# Patient Record
Sex: Male | Born: 1960 | Race: Black or African American | Hispanic: No | Smoking: Current every day smoker
Health system: Southern US, Community
[De-identification: ages and names within clinical notes are randomized; demographics above are authoritative.]

## PROBLEM LIST (undated history)

## (undated) DIAGNOSIS — F1010 Alcohol abuse, uncomplicated: Secondary | ICD-10-CM

## (undated) DIAGNOSIS — E119 Type 2 diabetes mellitus without complications: Secondary | ICD-10-CM

---

## 2005-11-21 ENCOUNTER — Emergency Department: Payer: Self-pay | Admitting: Emergency Medicine

## 2005-12-04 ENCOUNTER — Emergency Department: Payer: Self-pay | Admitting: Emergency Medicine

## 2014-04-16 ENCOUNTER — Emergency Department: Payer: Self-pay | Admitting: Emergency Medicine

## 2014-06-11 ENCOUNTER — Emergency Department
Admit: 2014-06-11 | Disposition: A | Discharge status: Home / Self Care | Payer: Self-pay | Admitting: Emergency Medicine

## 2014-06-11 LAB — DRUG SCREEN, URINE
Amphetamines, Ur Screen: NEGATIVE
Barbiturates, Ur Screen: NEGATIVE
Benzodiazepine, Ur Scrn: NEGATIVE
CANNABINOID 50 NG, UR ~~LOC~~: POSITIVE
Cocaine Metabolite,Ur ~~LOC~~: NEGATIVE
MDMA (ECSTASY) UR SCREEN: NEGATIVE
Methadone, Ur Screen: NEGATIVE
Opiate, Ur Screen: NEGATIVE
PHENCYCLIDINE (PCP) UR S: NEGATIVE
Tricyclic, Ur Screen: NEGATIVE

## 2014-06-11 LAB — COMPREHENSIVE METABOLIC PANEL
ALK PHOS: 64 U/L
Albumin: 4.1 g/dL
Anion Gap: 12 (ref 7–16)
BUN: 11 mg/dL
Bilirubin,Total: 0.4 mg/dL
CREATININE: 0.68 mg/dL
Calcium, Total: 8.5 mg/dL — ABNORMAL LOW
Chloride: 99 mmol/L — ABNORMAL LOW
Co2: 23 mmol/L
EGFR (African American): >60
EGFR (Non-African Amer.): >60
GLUCOSE: 152 mg/dL — AB
POTASSIUM: 3.8 mmol/L
SGOT(AST): 61 U/L — ABNORMAL HIGH
SGPT (ALT): 50 U/L
Sodium: 134 mmol/L — ABNORMAL LOW
TOTAL PROTEIN: 7.5 g/dL

## 2014-06-11 LAB — CBC
HCT: 37.8 % — ABNORMAL LOW (ref 40.0–52.0)
HGB: 12.9 g/dL — AB (ref 13.0–18.0)
MCH: 30.4 pg (ref 26.0–34.0)
MCHC: 34.1 g/dL (ref 32.0–36.0)
MCV: 89 fL (ref 80–100)
PLATELETS: 336 10*3/uL (ref 150–440)
RBC: 4.24 10*6/uL — AB (ref 4.40–5.90)
RDW: 12.8 % (ref 11.5–14.5)
WBC: 5.3 10*3/uL (ref 3.8–10.6)

## 2014-06-11 LAB — URINALYSIS, COMPLETE
BILIRUBIN, UR: NEGATIVE
Blood: NEGATIVE
GLUCOSE, UR: NEGATIVE mg/dL (ref 0–75)
LEUKOCYTE ESTERASE: NEGATIVE
Nitrite: NEGATIVE
Ph: 5 (ref 4.5–8.0)
Protein: NEGATIVE
RBC, UR: NONE SEEN /HPF (ref 0–5)
SQUAMOUS EPITHELIAL: NONE SEEN
Specific Gravity: 1.023 (ref 1.003–1.030)

## 2014-06-11 LAB — ETHANOL: ETHANOL LVL: 42 mg/dL

## 2014-06-11 LAB — ACETAMINOPHEN LEVEL: Acetaminophen: <10 ug/mL

## 2014-06-11 LAB — SALICYLATE LEVEL: Salicylates, Serum: <4 mg/dL

## 2016-07-31 ENCOUNTER — Emergency Department
Admission: EM | Admit: 2016-07-31 | Discharge: 2016-07-31 | Disposition: A | Discharge status: Home / Self Care | Payer: Self-pay | Attending: Emergency Medicine | Admitting: Emergency Medicine

## 2016-07-31 ENCOUNTER — Emergency Department: Payer: Self-pay

## 2016-07-31 ENCOUNTER — Encounter: Payer: Self-pay | Admitting: Emergency Medicine

## 2016-07-31 DIAGNOSIS — R0981 Nasal congestion: Secondary | ICD-10-CM | POA: Insufficient documentation

## 2016-07-31 DIAGNOSIS — J40 Bronchitis, not specified as acute or chronic: Secondary | ICD-10-CM | POA: Insufficient documentation

## 2016-07-31 DIAGNOSIS — F17200 Nicotine dependence, unspecified, uncomplicated: Secondary | ICD-10-CM | POA: Insufficient documentation

## 2016-07-31 DIAGNOSIS — R05 Cough: Secondary | ICD-10-CM | POA: Insufficient documentation

## 2016-07-31 MED ORDER — IPRATROPIUM-ALBUTEROL 0.5-2.5 (3) MG/3ML IN SOLN
3.0000 mL | Freq: Once | RESPIRATORY_TRACT | Status: AC
  Administered 2016-07-31: 3 mL via RESPIRATORY_TRACT
  Filled 2016-07-31: qty 3

## 2016-07-31 MED ORDER — PREDNISONE 10 MG PO TABS: ORAL_TABLET | ORAL | 0 refills | Status: DC

## 2016-07-31 MED ORDER — AZITHROMYCIN 250 MG PO TABS: ORAL_TABLET | ORAL | 0 refills | Status: DC

## 2016-07-31 NOTE — ED Triage Notes (Signed)
Pt with cough and congestion for over a week.

## 2016-07-31 NOTE — ED Notes (Signed)
See triage note states he has had chest congestion and prod cough for about 6 days  States he may had some fever but not sure of what his fever was..Richard Estes

## 2016-07-31 NOTE — ED Provider Notes (Signed)
[facility] Emergency Department Provider Note  ____________________________________________  Time seen: Approximately 6:01 PM  I have reviewed the triage vital signs and the nursing notes.   HISTORY  Chief Complaint Sore Throat; Nasal Congestion; and Cough    HPI Richard Estes is a 56 y.o. male that presents to emergency department with 7 days of productive cough with clear sputum. He is eating and drinking normally. Patient has taken Mucinex for symptoms. He smokes one pack of cigarettes per day. No history of asthma or allergies. He denies fever, headache, nasal congestion, sore throat, shortness of breath, chest pain, nausea, vomiting, abdominal pain.   History reviewed. No pertinent past medical history.  There are no active problems to display for this patient.   History reviewed. No pertinent surgical history.  Prior to Admission medications   Medication Sig Start Date End Date Taking? Authorizing Provider  azithromycin (ZITHROMAX Z-PAK) 250 MG tablet Take 2 tablets (500 mg) on  Day 1,  followed by 1 tablet (250 mg) once daily on Days 2 through 5. 07/31/16   Enid DerryWagner, Raylin Diguglielmo, PA-C  predniSONE (DELTASONE) 10 MG tablet Take 6 tablets on day 1, take 5 tablets on day 2, take 4 tablets on day 3, take 3 tablets on day 4, take 2 tablets on day 5, take 1 tablet on day 6 07/31/16   Enid DerryWagner, Llewellyn Schoenberger, PA-C    Allergies Patient has no known allergies.  No family history on file.  Social History Social History  Substance Use Topics  . Smoking status: Current Every Day Smoker  . Smokeless tobacco: Never Used  . Alcohol use Yes     Review of Systems  Constitutional: No fever/chills Eyes: No visual changes. No discharge. ENT: Negative for congestion and rhinorrhea. Cardiovascular: No chest pain. Respiratory: Positive for cough. No SOB. Gastrointestinal: No abdominal pain.  No nausea, no vomiting.  No diarrhea.  No constipation. Musculoskeletal:  Negative for musculoskeletal pain. Skin: Negative for rash, abrasions, lacerations, ecchymosis. Neurological: Negative for headaches.   ____________________________________________   PHYSICAL EXAM:  VITAL SIGNS: ED Triage Vitals [07/31/16 1644]  Enc Vitals Group     BP (!) 126/93     Pulse Rate (!) 106     Resp 20     Temp 98.9 F (37.2 C)     Temp Source Oral     SpO2 97 %     Weight 155 lb (70.3 kg)     Height      Head Circumference      Peak Flow      Pain Score      Pain Loc      Pain Edu?      Excl. in GC?      Constitutional: Alert and oriented. Well appearing and in no acute distress. Eyes: Conjunctivae are normal. PERRL. EOMI. No discharge. Head: Atraumatic. ENT: No frontal and maxillary sinus tenderness.      Ears: Tympanic membranes pearly gray with good landmarks. No discharge.      Nose: No congestion/rhinnorhea.      Mouth/Throat: Mucous membranes are moist. Oropharynx non-erythematous. Tonsils not enlarged. No exudates. Uvula midline. Neck: No stridor.   Hematological/Lymphatic/Immunilogical: No cervical lymphadenopathy. Cardiovascular: Normal rate, regular rhythm.  Good peripheral circulation. Respiratory: Normal respiratory effort without tachypnea or retractions. Lungs CTAB. Good air entry to the bases with no decreased or absent breath sounds. Gastrointestinal: Bowel sounds 4 quadrants. Soft and nontender to palpation. No guarding or rigidity. No palpable masses. No distention.  Musculoskeletal: Full range of motion to all extremities. No gross deformities appreciated. Neurologic:  Normal speech and language. No gross focal neurologic deficits are appreciated.  Skin:  Skin is warm, dry and intact. No rash noted.   ____________________________________________   LABS (all labs ordered are listed, but only abnormal results are displayed)  Labs Reviewed - No data to  display ____________________________________________  EKG   ____________________________________________  RADIOLOGY Lexine Baton, personally viewed and evaluated these images (plain radiographs) as part of my medical decision making, as well as reviewing the written report by the radiologist.  Dg Chest 2 View  Result Date: 07/31/2016 CLINICAL DATA:  Chest congestion and productive cough for the past 6 days. Possible fever during that time. EXAM: CHEST  2 VIEW COMPARISON:  11/21/2005. FINDINGS: Normal sized heart. Clear lungs. Mild central peribronchial thickening. Unremarkable bones. IMPRESSION: Mild bronchitic changes. Electronically Signed   By: Beckie Salts M.D.   On: 07/31/2016 17:41    ____________________________________________    PROCEDURES  Procedure(s) performed:    Procedures    Medications  ipratropium-albuterol (DUONEB) 0.5-2.5 (3) MG/3ML nebulizer solution 3 mL (3 mLs Nebulization Given 07/31/16 1811)     ____________________________________________   INITIAL IMPRESSION / ASSESSMENT AND PLAN / ED COURSE  Pertinent labs & imaging results that were available during my care of the patient were reviewed by me and considered in my medical decision making (see chart for details).  Review of the [place] CSRS was performed in accordance of the [facility] prior to dispensing any controlled drugs.   Patient's diagnosis is consistent with bronchitis. Vital signs and exam are reassuring. No acute cardiopulmonary processes on x-ray. He was given DuoNeb. Patient appears well and is staying well hydrated. Patient feels comfortable going home. Patient will be discharged home with prescriptions for azithromycin and prednisone. Patient is to follow up with PCP as needed or otherwise directed. Patient is given ED precautions to return to the ED for any worsening or new symptoms.     ____________________________________________  FINAL CLINICAL IMPRESSION(S) / ED  DIAGNOSES  Final diagnoses:  Bronchitis      NEW MEDICATIONS STARTED DURING THIS VISIT:  Discharge Medication List as of 07/31/2016  6:13 PM    START taking these medications   Details  azithromycin (ZITHROMAX Z-PAK) 250 MG tablet Take 2 tablets (500 mg) on  Day 1,  followed by 1 tablet (250 mg) once daily on Days 2 through 5., Print    predniSONE (DELTASONE) 10 MG tablet Take 6 tablets on day 1, take 5 tablets on day 2, take 4 tablets on day 3, take 3 tablets on day 4, take 2 tablets on day 5, take 1 tablet on day 6, Print            This chart was dictated using voice recognition software/Dragon. Despite best efforts to proofread, errors can occur which can change the meaning. Any change was purely unintentional.    Enid Derry, PA-C 07/31/16 1849    Loleta Rose, MD 07/31/16 [phone]

## 2016-08-08 ENCOUNTER — Emergency Department
Admission: EM | Admit: 2016-08-08 | Discharge: 2016-08-11 | Disposition: A | Discharge status: Home / Self Care | Payer: Self-pay | Attending: Emergency Medicine | Admitting: Emergency Medicine

## 2016-08-08 ENCOUNTER — Encounter: Payer: Self-pay | Admitting: *Deleted

## 2016-08-08 DIAGNOSIS — F1910 Other psychoactive substance abuse, uncomplicated: Secondary | ICD-10-CM

## 2016-08-08 DIAGNOSIS — F1410 Cocaine abuse, uncomplicated: Secondary | ICD-10-CM | POA: Insufficient documentation

## 2016-08-08 DIAGNOSIS — F1210 Cannabis abuse, uncomplicated: Secondary | ICD-10-CM | POA: Insufficient documentation

## 2016-08-08 DIAGNOSIS — F17200 Nicotine dependence, unspecified, uncomplicated: Secondary | ICD-10-CM | POA: Insufficient documentation

## 2016-08-08 DIAGNOSIS — F10230 Alcohol dependence with withdrawal, uncomplicated: Secondary | ICD-10-CM

## 2016-08-08 LAB — COMPREHENSIVE METABOLIC PANEL
ALT: 61 U/L (ref 17–63)
AST: 56 U/L — AB (ref 15–41)
Albumin: 3.8 g/dL (ref 3.5–5.0)
Alkaline Phosphatase: 46 U/L (ref 38–126)
Anion gap: 9 (ref 5–15)
BUN: 16 mg/dL (ref 6–20)
CO2: 27 mmol/L (ref 22–32)
Calcium: 9.2 mg/dL (ref 8.9–10.3)
Chloride: 98 mmol/L — ABNORMAL LOW (ref 101–111)
Creatinine, Ser: 0.92 mg/dL (ref 0.61–1.24)
GFR calc Af Amer: >60 mL/min (ref >60)
Glucose, Bld: 123 mg/dL — ABNORMAL HIGH (ref 65–99)
POTASSIUM: 3.9 mmol/L (ref 3.5–5.1)
Sodium: 134 mmol/L — ABNORMAL LOW (ref 135–145)
TOTAL PROTEIN: 7.5 g/dL (ref 6.5–8.1)
Total Bilirubin: 0.7 mg/dL (ref 0.3–1.2)

## 2016-08-08 LAB — CBC
HCT: 36.7 % — ABNORMAL LOW (ref 40.0–52.0)
Hemoglobin: 12.8 g/dL — ABNORMAL LOW (ref 13.0–18.0)
MCH: 32.1 pg (ref 26.0–34.0)
MCHC: 35 g/dL (ref 32.0–36.0)
MCV: 91.8 fL (ref 80.0–100.0)
Platelets: 293 10*3/uL (ref 150–440)
RBC: 3.99 MIL/uL — ABNORMAL LOW (ref 4.40–5.90)
RDW: 12.9 % (ref 11.5–14.5)
WBC: 7 10*3/uL (ref 3.8–10.6)

## 2016-08-08 LAB — ETHANOL: Alcohol, Ethyl (B): 5 mg/dL — ABNORMAL HIGH (ref <5)

## 2016-08-08 NOTE — ED Notes (Signed)
Pt. States he wants to detox from alcohol, cocaine and marijuana.  Pt. States he had been clean for 8 months.   Pt. States he relapsed last week.

## 2016-08-08 NOTE — ED Triage Notes (Signed)
Pt reports loosing job and place to live, pt states he has thought about suicide with no plan in the past, pt denies HI/SI, pt reports feeling depressed, pt also wants help to detox from ETOH, cocaine and marijuana, pt last used all three this morning

## 2016-08-08 NOTE — ED Notes (Signed)
Pt. States the last use of ETOH and cocaine was last night.

## 2016-08-09 LAB — URINE DRUG SCREEN, QUALITATIVE (ARMC ONLY)
Amphetamines, Ur Screen: NOT DETECTED
Barbiturates, Ur Screen: NOT DETECTED
Benzodiazepine, Ur Scrn: NOT DETECTED
CANNABINOID 50 NG, UR ~~LOC~~: POSITIVE — AB
COCAINE METABOLITE, UR ~~LOC~~: POSITIVE — AB
MDMA (Ecstasy)Ur Screen: NOT DETECTED
METHADONE SCREEN, URINE: NOT DETECTED
OPIATE, UR SCREEN: NOT DETECTED
Phencyclidine (PCP) Ur S: NOT DETECTED
Tricyclic, Ur Screen: NOT DETECTED

## 2016-08-09 MED ORDER — FOLIC ACID 1 MG PO TABS
1.0000 mg | ORAL_TABLET | Freq: Every day | ORAL | Status: DC
  Administered 2016-08-09 – 2016-08-10 (×2): 1 mg via ORAL
  Filled 2016-08-09 (×2): qty 1

## 2016-08-09 MED ORDER — CHLORDIAZEPOXIDE HCL 25 MG PO CAPS
25.0000 mg | ORAL_CAPSULE | Freq: Three times a day (TID) | ORAL | Status: DC | PRN
  Administered 2016-08-10 (×2): 25 mg via ORAL
  Filled 2016-08-09 (×2): qty 1

## 2016-08-09 MED ORDER — ACETAMINOPHEN 325 MG PO TABS
650.0000 mg | ORAL_TABLET | Freq: Four times a day (QID) | ORAL | Status: DC | PRN
  Administered 2016-08-09: 650 mg via ORAL
  Filled 2016-08-09: qty 2

## 2016-08-09 MED ORDER — VITAMIN B-1 100 MG PO TABS
100.0000 mg | ORAL_TABLET | Freq: Every day | ORAL | Status: DC
  Administered 2016-08-09 – 2016-08-10 (×2): 100 mg via ORAL
  Filled 2016-08-09 (×2): qty 1

## 2016-08-09 MED ORDER — LORAZEPAM 2 MG PO TABS
0.0000 mg | ORAL_TABLET | Freq: Four times a day (QID) | ORAL | Status: AC
  Administered 2016-08-09: 2 mg via ORAL
  Filled 2016-08-09: qty 1

## 2016-08-09 MED ORDER — FOLIC ACID 1 MG PO TABS
1.0000 mg | ORAL_TABLET | Freq: Once | ORAL | Status: AC
  Administered 2016-08-09: 1 mg via ORAL
  Filled 2016-08-09: qty 1

## 2016-08-09 MED ORDER — LORAZEPAM 2 MG/ML IJ SOLN: 0.0000 mg | Freq: Four times a day (QID) | INTRAMUSCULAR | Status: DC

## 2016-08-09 NOTE — Progress Notes (Signed)
Pt presents with blunted affect and irritable mood on initial contact. A & O X4. Denies SI, HI, AVH and pain at this time. Verbalized being depressed related to current situation "I just don't have nowhere to go, I don't have a job and that's making me feel down".  Pt compliant with medications when offered. Cooperative with care / assessments and unit routines. Medications administered as per MD's orders. Emotional support and encouragement provided to pt. Q 15 minutes safety checks maintained without self harm gestures or outburst to note thus far. POC continues for safety and mood stability.

## 2016-08-09 NOTE — BH Assessment (Addendum)
Tele Assessment Note   Richard Estes is an 56 y.o.separated male, voluntarily brought in by [employer] after going to the police station and asking for help in the evening hours of 08/08/2016. Patient reported use of alcohol, cannabis, cocaine, and cigarettes prior to going to the police station.  Patient stated having a history of blackouts, "often," when using various substances.  Patient reported loss of his home and job during the previous week due to his inability to function after the use of preferred substances. Patient reported experiencing suicidal ideations, however not having a plan or intentions for other self-harm.  Patient stated, "I just can't keep living like this."  Patient reported having a decrease in sleep patterns and was only able to obtain 4 hours of sleep on a daily basis.  Patient reported continual experiences with depressive symptoms, such as despondency, fatigue, isolation, tearfulness, guilt, feelings of worthlessness, irritability and loss of interest in usual pleasures.  Patient denies HI/AVH or access to weapons.  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  Patient stated that he had a experienced a loss of 15 lbs during the previous year.  Patient reported being separated from his spouse.  Patient reported receiving inpatient treatment, for substance abuse, in 2014/2015, however not finish his treatment and leaving the facility early.   Patient is currently receiving no services from providers.  Patient identified support from his mother, however reported not wanting to bother her due to her being 56 years old.  Patient identified protective, such as his fear of death, that caused him not to attempt suicide.  Patient stated, "I'm too scared to do anything."  During assessment, Patient was cooperative, logical/coherent, and alert.  Patient was oriented to the time, place, location, and situation.  Patient's eye contact was  fair and he exhibited freedom of movement.  Patient made statements, such as "I ain't had anybody to talk to."  Patient stated that he would like to receive services from outpatient or inpatient services.  Patient stated that he would be accept of recommendations of treatment from providers.     Diagnosis: Major depressive disorder, recurrent, moderate Alcohol Use Disorder, Stimulant Use Disorder, Cannabis Use Disorder, Tobacco Use Disorder  Past Medical History: History reviewed. No pertinent past medical history.  History reviewed. No pertinent surgical history.  Family History: No family history on file.  Social History:  reports that he has been smoking.  He has never used smokeless tobacco. He reports that he drinks alcohol. He reports that he does not use drugs.  Additional Social History:  Alcohol / Drug Use Pain Medications: Patient denies Prescriptions: Patient denies Over the Counter: Patient denies History of alcohol / drug use?: Yes Longest period of sobriety (when/how long): Patient reports ongoing use of substances since the age of 40 or 32. Negative Consequences of Use: Legal, Financial, Personal relationships, Work / School Substance #1 Name of Substance 1: Alcohol 1 - Age of First Use: 15 or 16 1 - Amount (size/oz): Unknown 1 - Frequency: Daily 1 - Duration: Ongoing 1 - Last Use / Amount: 08/08/2016 Substance #2 Name of Substance 2: Cannabis 2 - Age of First Use: 15 or 16 2 - Amount (size/oz): Unknown 2 - Frequency: Daily 2 - Duration: Ongoing 2 - Last Use / Amount: 08/08/2016 Substance #3 Name of Substance 3: Cocaine 3 - Age of First Use: 24 3 - Amount (size/oz): Unknown 3 - Frequency: 2x Monthly  3 - Duration: Ongoing 3 - Last Use / Amount: 08/08/2016  CIWA: CIWA-Ar BP: (!) 131/108 Pulse Rate: 99 COWS:    PATIENT STRENGTHS: (choose at least two) Ability for insight Average or above average intelligence Capable of independent  living Communication skills Motivation for treatment/growth Work skills  Allergies: No Known Allergies  Home Medications:  (Not in a hospital admission)  OB/GYN Status:  No LMP for male patient.  General Assessment Data [facility] Assessment: In system Is this a Tele or Face-to-Face Assessment?: Tele Assessment Is this an Initial Assessment or a Re-assessment for this encounter?: Initial Assessment Marital status: Separated Maiden name: N/A Is patient pregnant?: No Pregnancy Status: No Living Arrangements: Other (Comment) (Pt. reported being homeless) Can pt return to current living arrangement?: Yes Admission Status: Voluntary Is patient capable of signing voluntary admission?: Yes Referral Source: Self/Family/Friend Insurance type: None  Medical Screening Exam [facility]-in ONLY) Medical Exam completed: Yes Reason for MSE not completed: Other: (N/A)  Crisis Care Plan Living Arrangements: Other (Comment) (Pt. reported being homeless) Legal Guardian: Other: (Self) Name of Psychiatrist: None Name of Therapist: None  Education Status Is patient currently in school?: No Current Grade: N/A Highest grade of school patient has completed: 12th Name of school: N/A Contact person: N/A  Risk to self with the past 6 months Suicidal Ideation: Yes-Currently Present Has patient been a risk to self within the past 6 months prior to admission? : Yes Suicidal Intent: Yes-Currently Present Has patient had any suicidal intent within the past 6 months prior to admission? : Yes Is patient at risk for suicide?: Yes Suicidal Plan?: No-Not Currently/Within Last 6 Months (Pt. denies) Has patient had any suicidal plan within the past 6 months prior to admission? : No Access to Means: No (Pt. denies) What has been your use of drugs/alcohol within the last 12 months?: Alcohol, Cannabis, Cocaine, and Cigarettes Previous Attempts/Gestures: No (Pt. denies) How many  times?: 0 Other Self Harm Risks: 0 (Pt. denies) Triggers for Past Attempts: None known Intentional Self Injurious Behavior: None Family Suicide History: No Recent stressful life event(s): Loss (Comment), Job Loss, Financial Problems, Other (Comment) (Loss of home relating to job loss and SA) Persecutory voices/beliefs?: No Depression: Yes Depression Symptoms: Tearfulness, Fatigue, Guilt, Loss of interest in usual pleasures, Feeling worthless/self pity, Feeling angry/irritable, Isolating, Despondent Substance abuse history and/or treatment for substance abuse?: Yes Suicide prevention information given to non-admitted patients: Not applicable  Risk to Others within the past 6 months Homicidal Ideation: No (Pt. denies) Does patient have any lifetime risk of violence toward others beyond the six months prior to admission? : No Thoughts of Harm to Others: No Current Homicidal Intent: No Current Homicidal Plan: No Access to Homicidal Means: No (Pt. denies) Identified Victim: None History of harm to others?: No (Pt. denies) Assessment of Violence: On admission Violent Behavior Description: None Does patient have access to weapons?: No (Pt. denies) Criminal Charges Pending?: No Does patient have a court date: No Is patient on probation?: No  Psychosis Hallucinations: None noted Delusions: None noted  Mental Status Report Appearance/Hygiene: In scrubs Eye Contact: Fair Motor Activity: Freedom of movement, Unremarkable Speech: Logical/coherent, Soft Level of Consciousness: Alert Mood: Depressed, Despair, Sad, Helpless Affect: Sad, Appropriate to circumstance, Depressed Anxiety Level: Minimal Thought Processes: Coherent, Relevant, Circumstantial Judgement: Unimpaired Orientation: Person, Place, Time, Situation Obsessive Compulsive Thoughts/Behaviors: None  Cognitive Functioning Concentration: Fair Memory: Recent Intact, Remote Intact IQ: Average Insight: Fair Impulse Control:  Poor  Appetite: Poor Weight Loss: 15 (Pt. reports loss during the previous year) Weight Gain: 0 Sleep: Decreased Total Hours of Sleep: 4 Vegetative Symptoms: None  ADLScreening [facility] Services) Patient's cognitive ability adequate to safely complete daily activities?: Yes Patient able to express need for assistance with ADLs?: Yes Independently performs ADLs?: Yes (appropriate for developmental age)  Prior Inpatient Therapy Prior Inpatient Therapy: Yes Prior Therapy Dates: Unknown Prior Therapy Facilty/Provider(s): Substance Abuse Inpatient in [place], [place] Reason for Treatment: Substance Abuse (Pt. reported leaving program early.)  Prior Outpatient Therapy Prior Outpatient Therapy: No Prior Therapy Dates: N/A Prior Therapy Facilty/Provider(s): None Reason for Treatment: N/A Does patient have an ACCT team?: No Does patient have Intensive In-House Services?  : No Does patient have [facility] services? : No Does patient have P4CC services?: No  ADL Screening (condition at time of admission) Patient's cognitive ability adequate to safely complete daily activities?: Yes Is the patient deaf or have difficulty hearing?: No Does the patient have difficulty seeing, even when wearing glasses/contacts?: No Does the patient have difficulty concentrating, remembering, or making decisions?: No Patient able to express need for assistance with ADLs?: Yes Does the patient have difficulty dressing or bathing?: No Independently performs ADLs?: Yes (appropriate for developmental age) Does the patient have difficulty walking or climbing stairs?: No Weakness of Legs: None Weakness of Arms/Hands: None  Home Assistive Devices/Equipment Home Assistive Devices/Equipment: None    Abuse/Neglect Assessment (Assessment to be complete while patient is alone) Physical Abuse: Denies Verbal Abuse: Denies Sexual Abuse: Denies Exploitation of patient/patient's resources: Denies Self-Neglect:  Denies     Merchant navy officer (For Healthcare) Does Patient Have a Medical Advance Directive?: No Would patient like information on creating a medical advance directive?: No - Patient declined    Additional Information 1:1 In Past 12 Months?: No CIRT Risk: No Elopement Risk: No Does patient have medical clearance?: Yes     Disposition:  Disposition Initial Assessment Completed for this Encounter: Yes Disposition of Patient: Other dispositions (Refer to [facility] for disposition) Other disposition(s): Other (Comment) (Refer to [facility] for recommendations )  Talbert Nan 08/09/2016 1:08 AM

## 2016-08-09 NOTE — ED Provider Notes (Signed)
[facility] Emergency Department Provider Note   ____________________________________________   First MD Initiated Contact with Patient 08/09/16 2351     (approximate)  I have reviewed the triage vital signs and the nursing notes.   HISTORY  Chief Complaint Mental Health Problem    HPI Richard Estes is a 56 y.o. male who comes into the hospital today as he is here for detox. He reports that he is detoxing from alcohol, cocaine and marijuana. The patient states that he last used there is a night and Friday morning. He just started back using crack cocaine last week. He reports he hadn't used for over 8 months. He reports that he drinks as much as he can every day until his money runs out. He reports that he is in detox before but it was about years ago. The patient reports that he did have some feelings of suicidal ideation earlier today but he denies having any of those feelings now. The patient denies any hallucinations. He is here for evaluation and for help.   History reviewed. No pertinent past medical history.  There are no active problems to display for this patient.   History reviewed. No pertinent surgical history.  Prior to Admission medications   Medication Sig Start Date End Date Taking? Authorizing Provider  azithromycin (ZITHROMAX Z-PAK) 250 MG tablet Take 2 tablets (500 mg) on  Day 1,  followed by 1 tablet (250 mg) once daily on Days 2 through 5. Patient not taking: Reported on 08/09/2016 07/31/16   Enid DerryWagner, Ashley, PA-C  predniSONE (DELTASONE) 10 MG tablet Take 6 tablets on day 1, take 5 tablets on day 2, take 4 tablets on day 3, take 3 tablets on day 4, take 2 tablets on day 5, take 1 tablet on day 6 Patient not taking: Reported on 08/09/2016 07/31/16   Enid DerryWagner, Ashley, PA-C    Allergies Patient has no known allergies.  No family history on file.  Social History Social History  Substance Use Topics  . Smoking status: Current Every  Day Smoker  . Smokeless tobacco: Never Used  . Alcohol use Yes    Review of Systems  Constitutional: No fever/chills Eyes: No visual changes. ENT: No sore throat. Cardiovascular: Denies chest pain. Respiratory: Denies shortness of breath. Gastrointestinal: No abdominal pain.  No nausea, no vomiting.  No diarrhea.  No constipation. Genitourinary: Negative for dysuria. Musculoskeletal: Negative for back pain. Skin: Negative for rash. Neurological: Negative for headaches, focal weakness or numbness.   ____________________________________________   PHYSICAL EXAM:  VITAL SIGNS: ED Triage Vitals [08/08/16 2311]  Enc Vitals Group     BP (!) 131/108     Pulse Rate 99     Resp 20     Temp 98.6 F (37 C)     Temp Source Oral     SpO2 98 %     Weight 145 lb (65.8 kg)     Height 5\' 9"  (1.753 m)     Head Circumference      Peak Flow      Pain Score      Pain Loc      Pain Edu?      Excl. in GC?     Constitutional: Alert and oriented. Well appearing and in no acute distress. Eyes: Conjunctivae are normal. PERRL. EOMI. Head: Atraumatic. Nose: No congestion/rhinnorhea. Mouth/Throat: Mucous membranes are moist.  Oropharynx non-erythematous. Cardiovascular: Normal rate, regular rhythm. Grossly normal heart sounds.  Good peripheral circulation. Respiratory: Normal  respiratory effort.  No retractions. Lungs CTAB. Gastrointestinal: Soft and nontender. No distention. Positive bowel sounds Musculoskeletal: No lower extremity tenderness nor edema.   Neurologic:  Normal speech and language.  Skin:  Skin is warm, dry and intact.  Psychiatric: Mood and affect are normal.   ____________________________________________   LABS (all labs ordered are listed, but only abnormal results are displayed)  Labs Reviewed  COMPREHENSIVE METABOLIC PANEL - Abnormal; Notable for the following:       Result Value   Sodium 134 (*)    Chloride 98 (*)    Glucose, Bld 123 (*)    AST 56 (*)    All  other components within normal limits  ETHANOL - Abnormal; Notable for the following:    Alcohol, Ethyl (B) 5 (*)    All other components within normal limits  CBC - Abnormal; Notable for the following:    RBC 3.99 (*)    Hemoglobin 12.8 (*)    HCT 36.7 (*)    All other components within normal limits  URINE DRUG SCREEN, QUALITATIVE ([facility] ONLY) - Abnormal; Notable for the following:    Cocaine Metabolite,Ur [place] POSITIVE (*)    Cannabinoid 50 Ng, Ur [place] POSITIVE (*)    All other components within normal limits   ____________________________________________  EKG  none ____________________________________________  RADIOLOGY  No results found.  ____________________________________________   PROCEDURES  Procedure(s) performed: None  Procedures  Critical Care performed: No  ____________________________________________   INITIAL IMPRESSION / ASSESSMENT AND PLAN / ED COURSE  Pertinent labs & imaging results that were available during my care of the patient were reviewed by me and considered in my medical decision making (see chart for details).  This is a 56 year old male who comes into the hospital today requesting detox. The patient did have some passive suicidal ideation. I will have the patient evaluated by TTS. I will give the patient some folate and thiamine. He'll be reassessed.      ____________________________________________   FINAL CLINICAL IMPRESSION(S) / ED DIAGNOSES  Final diagnoses:  Substance abuse      NEW MEDICATIONS STARTED DURING THIS VISIT:  New Prescriptions   No medications on file     Note:  This document was prepared using Dragon voice recognition software and may include unintentional dictation errors.    Rebecka Apley, MD 08/09/16 [phone]

## 2016-08-09 NOTE — ED Notes (Signed)

## 2016-08-09 NOTE — BHH Counselor (Signed)
Writer spoke with Matt, RN and confirmed no orders for tele-psych from SOC completed at the time.  Writer and RN discussed future correspondence if orders/tele-psych completed.     Kijana Estock, LPC-A & LCAS-A Therapeutic Triage Specialist [phone] 

## 2016-08-09 NOTE — ED Provider Notes (Addendum)
-----------------------------------------  4:47 PM on 08/09/2016 -----------------------------------------  Psychiatry consult note reviewed, recommends inpatient substance abuse treatment, CIWA protocol, Librium 3 times a day, folate and thiamine. I met with the patient face-to-face, or he is calm and cooperative with normal vital signs after receiving Ativan this morning for a CIWA score of 11. I discussed with psychiatry recommendations with him and he is agreeable for inpatient residential treatment. Will follow-up with [facility] for placement.    Carrie Mew, MD 08/09/16 1650   ----------------------------------------- 6:15 PM on 08/09/2016 -----------------------------------------  Discussed with [facility]. We don't have any inpatient detox here at [facility] that can accommodate this patient. Because his withdrawal is mild and uncomplicated and not associated with any psychiatric disorder such as suicidality or hallucinations, I'm told he does not meet criteria for [place]'s inpatient detox beds. As a result, I'm unable to arrange the inpatient detox that was recommended by the psychiatry consultant. The patient is given information for residential treatment services and [facility] which he can contact. He does report that he is homeless and would not be or to get on the bus until tomorrow morning. Because of this, we'll plan to discharge the morning so that we can give Librium in the meantime since otherwise if you're discharged now he would sit in the waiting room on medicated waiting until the bus comes tomorrow. He is medically and psychiatrically stable. Vital signs are normal.   Carrie Mew, MD 08/09/16 [phone]

## 2016-08-09 NOTE — ED Notes (Signed)
Patient is voluntary and is pending placement. 

## 2016-08-09 NOTE — ED Notes (Signed)
Report was received from Lenna GilfordMathew M., RN; Pt. Verbalizes  complaints of having Depression; distress of losses of job and home; verbalizes having S.I.; with no plan; H/O substance abuse of etoh; cocaine; and cannibus; denies having Hi. Continue to monitor with 15 min. Monitoring.

## 2016-08-09 NOTE — ED Notes (Signed)
rm 23 

## 2016-08-10 NOTE — ED Notes (Signed)
Pt continues to be polite, pleasant and cooperative. Pt given 2 doses of librium for CIWA scores of 6 and 8 respectively.  Pt only ate 50% of his lunch due to nausea.  Pt aware of transport to [facility] tomorrow morning. Maintained on 15 minute checks and observation by security camera for safety.

## 2016-08-10 NOTE — ED Notes (Signed)

## 2016-08-10 NOTE — ED Notes (Signed)
Report was received from Amy B., RN; Pt. Verbalizes no complaints or distress; denies S.I./Hi. Continue to monitor with 15 min. Monitoring. 

## 2016-08-10 NOTE — ED Notes (Signed)
Pt pleasant, cooperative. Compliant with medications. Denies SI/HI and AVH. No needs or concerns at this time. Maintained on 15 minute checks and observation by security camera for safety.

## 2016-08-10 NOTE — ED Notes (Signed)
Breakfast tray placed in patient's room.  

## 2016-08-10 NOTE — BH Assessment (Addendum)
Referral to [facility] was submitted... Acceptance pending.Richard Estes.  [facility] at [facility] has accepted patient, however they cannot admit him until tomorrow 08/11/16 AM... [facility] will have someone pick him up by 10AM on 08/11/16; [facility]

## 2016-08-10 NOTE — ED Notes (Signed)
Pt received lunch tray 

## 2016-08-10 NOTE — ED Provider Notes (Signed)
-----------------------------------------   7:14 AM on 08/10/2016 -----------------------------------------   Blood pressure (!) 137/97, pulse 70, temperature 98.2 F (36.8 C), temperature source Oral, resp. rate 20, height 5\' 9"  (1.753 m), weight 65.8 kg (145 lb), SpO2 100 %.  The patient had no acute events since last update.  Calm and cooperative at this time.  Disposition is pending Psychiatry/Behavioral Medicine team recommendations.     Merrily Brittleifenbark, Navin Dogan, MD 08/10/16 [phone]

## 2016-08-10 NOTE — ED Notes (Signed)
RN spoke with ER MD about orders for Ativan and PRN librium. Doctor requested RN hold the Ativan and administer librium for withdrawal symptoms.

## 2016-08-10 NOTE — ED Notes (Signed)
Patient asleep in room. No noted distress or abnormal behavior. Will continue 15 minute checks and observation by security cameras for safety. 

## 2016-08-11 NOTE — ED Provider Notes (Signed)
Vitals:   08/10/16 1930 08/11/16 0642  BP: 117/83 113/90  Pulse: 83 83  Resp: 16 16  Temp: 98.5 F (36.9 C) 98.1 F (36.7 C)   Patient remains medically stable for psychiatric disposition   Emily FilbertWilliams, Jonathan E, MD 08/11/16 [phone]

## 2016-08-11 NOTE — ED Notes (Signed)
Pt discharged to lobby in c/o [facility].  Pt was stable and appreciative at that time. All papers were given and valuables returned. Verbal understanding expressed. Denies SI/HI and A/VH. Pt given opportunity to express concerns and ask questions.

## 2016-08-11 NOTE — ED Provider Notes (Signed)
Patient was accepted [facility].   Richard Estes, Jonathan E, MD 08/11/16 [phone]

## 2016-08-11 NOTE — ED Notes (Addendum)
Breakfast brought to patient 

## 2016-08-31 ENCOUNTER — Emergency Department
Admission: EM | Admit: 2016-08-31 | Discharge: 2016-08-31 | Disposition: A | Discharge status: Home / Self Care | Payer: Self-pay | Attending: Emergency Medicine | Admitting: Emergency Medicine

## 2016-08-31 ENCOUNTER — Encounter: Payer: Self-pay | Admitting: Emergency Medicine

## 2016-08-31 DIAGNOSIS — L0201 Cutaneous abscess of face: Secondary | ICD-10-CM | POA: Insufficient documentation

## 2016-08-31 DIAGNOSIS — F17210 Nicotine dependence, cigarettes, uncomplicated: Secondary | ICD-10-CM | POA: Insufficient documentation

## 2016-08-31 HISTORY — DX: Alcohol abuse, uncomplicated: F10.10

## 2016-08-31 MED ORDER — CLINDAMYCIN HCL 300 MG PO CAPS: 300.0000 mg | ORAL_CAPSULE | Freq: Three times a day (TID) | ORAL | 0 refills | Status: DC

## 2016-08-31 MED ORDER — CLINDAMYCIN HCL 150 MG PO CAPS
300.0000 mg | ORAL_CAPSULE | Freq: Once | ORAL | Status: AC
  Administered 2016-08-31: 300 mg via ORAL
  Filled 2016-08-31: qty 2

## 2016-08-31 NOTE — Discharge Instructions (Signed)
1. Take antibiotic as prescribed (clindamycin 300 mg 3 times daily 10 days). 2. Apply warm compress to affected area several times daily. 3. Return to the ER for worsening symptoms, persistent vomiting, fever or other concerns.

## 2016-08-31 NOTE — ED Triage Notes (Signed)
Pt dropped off from [facility] for abscess to right cheek for 5 days; no drainage from site; afebrile;

## 2016-08-31 NOTE — ED Provider Notes (Signed)
[facility] Emergency Department Provider Note   ____________________________________________   First MD Initiated Contact with Patient 08/31/16 0158     (approximate)  I have reviewed the triage vital signs and the nursing notes.   HISTORY  Chief Complaint Abscess    HPI Richard Estes is a 56 y.o. male who presents to the ED from [facility] with a chief complaint of right facial abscess. Patient reports shaving 5 days ago, thinks he has an ingrown hair with increasing swelling to his right cheek. Denies drainage. Denies fever, chills, chest pain, shortness breath, abdominal pain, nausea, vomiting. Denies recent travel or trauma. Nothing makes his symptoms better or worse.   Past Medical History:  Diagnosis Date  . Alcohol abuse     There are no active problems to display for this patient.   History reviewed. No pertinent surgical history.  Prior to Admission medications   Medication Sig Start Date End Date Taking? Authorizing Provider  clindamycin (CLEOCIN) 300 MG capsule Take 1 capsule (300 mg total) by mouth 3 (three) times daily. 08/31/16   Irean HongSung, Webber Michiels J, MD    Allergies Patient has no known allergies.  History reviewed. No pertinent family history.  Social History Social History  Substance Use Topics  . Smoking status: Current Every Day Smoker    Types: Cigarettes  . Smokeless tobacco: Never Used  . Alcohol use No     Comment: no alcohol since 08/09/16    Review of Systems  Constitutional: No fever/chills. Eyes: No visual changes. ENT: Positive for facial abscess. No sore throat. Cardiovascular: Denies chest pain. Respiratory: Denies shortness of breath. Gastrointestinal: No abdominal pain.  No nausea, no vomiting.  No diarrhea.  No constipation. Genitourinary: Negative for dysuria. Musculoskeletal: Negative for back pain. Skin: Negative for rash. Neurological: Negative for headaches, focal weakness or  numbness.   ____________________________________________   PHYSICAL EXAM:  VITAL SIGNS: ED Triage Vitals  Enc Vitals Group     BP 08/31/16 0053 125/87     Pulse Rate 08/31/16 0053 72     Resp 08/31/16 0053 18     Temp 08/31/16 0053 98 F (36.7 C)     Temp Source 08/31/16 0053 Oral     SpO2 08/31/16 0053 100 %     Weight 08/31/16 0054 154 lb 2 oz (69.9 kg)     Height 08/31/16 0054 5\' 9"  (1.753 m)     Head Circumference --      Peak Flow --      Pain Score 08/31/16 0054 8     Pain Loc --      Pain Edu? --      Excl. in GC? --     Constitutional: Sleeping, awakened for exam. Alert and oriented. Well appearing and in no acute distress. Eyes: Conjunctivae are normal. PERRL. EOMI. Head: Atraumatic. Right lower cheek near the jaw line with dime-sized area of induration. No surrounding warmth or erythema. Nose: No congestion/rhinnorhea. Mouth/Throat: Mucous membranes are moist.  Oropharynx non-erythematous. Neck: No stridor.   Cardiovascular: Normal rate, regular rhythm. Grossly normal heart sounds.  Good peripheral circulation. Respiratory: Normal respiratory effort.  No retractions. Lungs CTAB. Gastrointestinal: Soft and nontender. No distention. No abdominal bruits. No CVA tenderness. Musculoskeletal: No lower extremity tenderness nor edema.  No joint effusions. Neurologic:  Normal speech and language. No gross focal neurologic deficits are appreciated. No gait instability. Skin:  Skin is warm, dry and intact. No rash noted. Psychiatric: Mood and affect are normal.  Speech and behavior are normal.  ____________________________________________   LABS (all labs ordered are listed, but only abnormal results are displayed)  Labs Reviewed - No data to display ____________________________________________  EKG  None ____________________________________________  RADIOLOGY  No results found.  ____________________________________________   PROCEDURES  Procedure(s)  performed: None  Procedures  Critical Care performed: No  ____________________________________________   INITIAL IMPRESSION / ASSESSMENT AND PLAN / ED COURSE  Pertinent labs & imaging results that were available during my care of the patient were reviewed by me and considered in my medical decision making (see chart for details).  56 year old male from [facility] with a five-day history of right facial swelling secondary to abscess. Area is firm with induration. Discussed with patient and recommended we try I&D of the area. Patient declines I&D at this time and would rather start antibiotics and follow up with ENT. Will initiate clindamycin. Strict return precautions given. Patient verbalizes understanding and agrees with plan of care.      ____________________________________________   FINAL CLINICAL IMPRESSION(S) / ED DIAGNOSES  Final diagnoses:  Facial abscess      NEW MEDICATIONS STARTED DURING THIS VISIT:  New Prescriptions   CLINDAMYCIN (CLEOCIN) 300 MG CAPSULE    Take 1 capsule (300 mg total) by mouth 3 (three) times daily.     Note:  This document was prepared using Dragon voice recognition software and may include unintentional dictation errors.    Irean Hong, MD 08/31/16 [phone]

## 2016-12-25 ENCOUNTER — Ambulatory Visit: Payer: Self-pay | Admitting: Family Medicine

## 2016-12-25 VITALS — BP 119/81 | HR 74 | Temp 98.2°F | Ht 69.0 in | Wt 174.5 lb

## 2016-12-25 DIAGNOSIS — Z09 Encounter for follow-up examination after completed treatment for conditions other than malignant neoplasm: Secondary | ICD-10-CM

## 2016-12-25 DIAGNOSIS — Z716 Tobacco abuse counseling: Secondary | ICD-10-CM

## 2016-12-25 DIAGNOSIS — Z0189 Encounter for other specified special examinations: Secondary | ICD-10-CM

## 2016-12-25 DIAGNOSIS — B49 Unspecified mycosis: Secondary | ICD-10-CM

## 2016-12-25 MED ORDER — CLOTRIMAZOLE 1 % EX CREA: 1.0000 "application " | TOPICAL_CREAM | Freq: Two times a day (BID) | CUTANEOUS | 0 refills | Status: DC

## 2016-12-25 NOTE — Progress Notes (Signed)
  Patient: Richard PayerBoris D Estes Male    DOB: May 18, 1960   56 y.o.   MRN: [id number] Visit Date: 12/25/2016  Today's Provider: Kallie LocksNatalie M Swayze Kozuch, FNP   Chief Complaint  Patient presents with  . Hand Pain    Looking for medications for fingers   Subjective:    HPI: Patient is here today for his initial visit to clinic. States that he has had fungus in his right fingernails for over 2 years now.  States that he has been using OTC anti-fungal polish, without any relief.  States that he is not in any distress at this time. Denies cough, chest pain, shortness of breath.  Denies dizziness, nausea, vomiting, diarrhea, constipation, or bleeding episodes. Denies fevers, and night sweats.      No Known Allergies This SmartLink is deprecated. Use AVSMEDLIST instead to display the medication list for a patient.  Review of Systems  Constitutional: Negative.   HENT: Negative.   Eyes: Negative.   Respiratory: Negative.   Cardiovascular: Negative.   Gastrointestinal: Negative.   Endocrine: Negative.   Genitourinary: Negative.   Musculoskeletal: Negative.   Skin: Negative.        Possible Fungus--right nail beds.   Allergic/Immunologic: Negative.   Neurological: Negative.   Hematological: Negative.   Psychiatric/Behavioral: Negative.     Social History   Tobacco Use  . Smoking status: Current Every Day Smoker    Packs/day: 1.00    Types: Cigarettes  . Smokeless tobacco: Never Used  . Tobacco comment: Sometimes list  Substance Use Topics  . Alcohol use: No    Comment: no alcohol since 08/09/16   Objective:   BP 119/81 (BP Location: Right Arm, Patient Position: Sitting, Cuff Size: Normal)   Pulse 74   Temp 98.2 F (36.8 C) (Oral)   Ht 5\' 9"  (1.753 m)   Wt 174 lb 8 oz (79.2 kg)   BMI 25.77 kg/m   Physical Exam  Constitutional: He is oriented to person, place, and time. He appears well-developed and well-nourished.  HENT:  Head: Normocephalic and atraumatic.  Right Ear: External ear  normal.  Left Ear: External ear normal.  Nose: Nose normal.  Mouth/Throat: Oropharynx is clear and moist.  Eyes: Pupils are equal, round, and reactive to light.  Neck: Normal range of motion. Neck supple.  Cardiovascular: Normal rate, regular rhythm, normal heart sounds and intact distal pulses.  Pulmonary/Chest: Effort normal and breath sounds normal.  Abdominal: Soft. Bowel sounds are normal.  Musculoskeletal: Normal range of motion.  Neurological: He is alert and oriented to person, place, and time.  Skin: Skin is warm and dry.  Psychiatric: He has a normal mood and affect. His behavior is normal. Judgment and thought content normal.  Nursing note and vitals reviewed.       Assessment & Plan:   1. Fungal Infection: Lotrimin % cream BID to nails.  Prescription sent to pharmacy.   2. Smoking Cessation Counseling: Counseled patient of the health benefits of quitting smoking.  Patient declined at this time. Counseled patient on benefits of Prostate exam and Colonoscopy.  Patient verbalized understanding.   3. Routine Lab Draw: CBC with diff, CMET lab drawn today.   4. Follow up:   2 weeks to review labs.      Kallie LocksNatalie M Ludella Pranger, FNP   [facility]

## 2016-12-26 LAB — CBC
Hematocrit: 37 % — ABNORMAL LOW (ref 37.5–51.0)
Hemoglobin: 13.3 g/dL (ref 13.0–17.7)
MCH: 29.8 pg (ref 26.6–33.0)
MCHC: 35.9 g/dL — ABNORMAL HIGH (ref 31.5–35.7)
MCV: 83 fL (ref 79–97)
RBC: 4.47 x10E6/uL (ref 4.14–5.80)
RDW: 12.9 % (ref 12.3–15.4)
WBC: 6 10*3/uL (ref 3.4–10.8)

## 2016-12-26 LAB — LIPID PANEL
Chol/HDL Ratio: 4.4 ratio (ref 0.0–5.0)
Cholesterol, Total: 157 mg/dL (ref 100–199)
HDL: 36 mg/dL — ABNORMAL LOW (ref >39)
LDL Calculated: 90 mg/dL (ref 0–99)
Triglycerides: 154 mg/dL — ABNORMAL HIGH (ref 0–149)
VLDL Cholesterol Cal: 31 mg/dL (ref 5–40)

## 2016-12-26 LAB — COMPREHENSIVE METABOLIC PANEL
ALT: 25 IU/L (ref 0–44)
AST: 27 IU/L (ref 0–40)
Albumin/Globulin Ratio: 1.7 (ref 1.2–2.2)
Albumin: 4.5 g/dL (ref 3.5–5.5)
Alkaline Phosphatase: 58 IU/L (ref 39–117)
BUN/Creatinine Ratio: 19 (ref 9–20)
BUN: 15 mg/dL (ref 6–24)
Bilirubin Total: 0.4 mg/dL (ref 0.0–1.2)
CO2: 24 mmol/L (ref 20–29)
Calcium: 9.4 mg/dL (ref 8.7–10.2)
Chloride: 104 mmol/L (ref 96–106)
Creatinine, Ser: 0.81 mg/dL (ref 0.76–1.27)
GFR calc Af Amer: 115 mL/min/{1.73_m2} (ref >59)
GFR calc non Af Amer: 99 mL/min/{1.73_m2} (ref >59)
Globulin, Total: 2.7 g/dL (ref 1.5–4.5)
Glucose: 99 mg/dL (ref 65–99)
Potassium: 4.4 mmol/L (ref 3.5–5.2)
Sodium: 144 mmol/L (ref 134–144)
Total Protein: 7.2 g/dL (ref 6.0–8.5)

## 2017-02-25 ENCOUNTER — Ambulatory Visit: Payer: Self-pay | Admitting: Pharmacy Technician

## 2017-03-06 ENCOUNTER — Ambulatory Visit: Payer: Self-pay

## 2017-05-14 ENCOUNTER — Telehealth: Payer: Self-pay | Admitting: Pharmacy Technician

## 2017-05-14 NOTE — Telephone Encounter (Signed)
Received updated proof of income.  Patient eligible to receive medication assistance at [facility] through 2019, as long as eligibility requirements continue to be met.  [facility]

## 2018-08-01 IMAGING — CR DG CHEST 2V
1 series · 2 of 2 positions shown · non-contrast
Comparison: 11/21/2005.

CLINICAL DATA: Chest congestion and productive cough for the past 6
days. Possible fever during that time.

EXAM:
CHEST  2 VIEW

[Series 1: dg chest 2 view · 0.14mm/px · 2 of 2 slices shown]
[im 1/2]
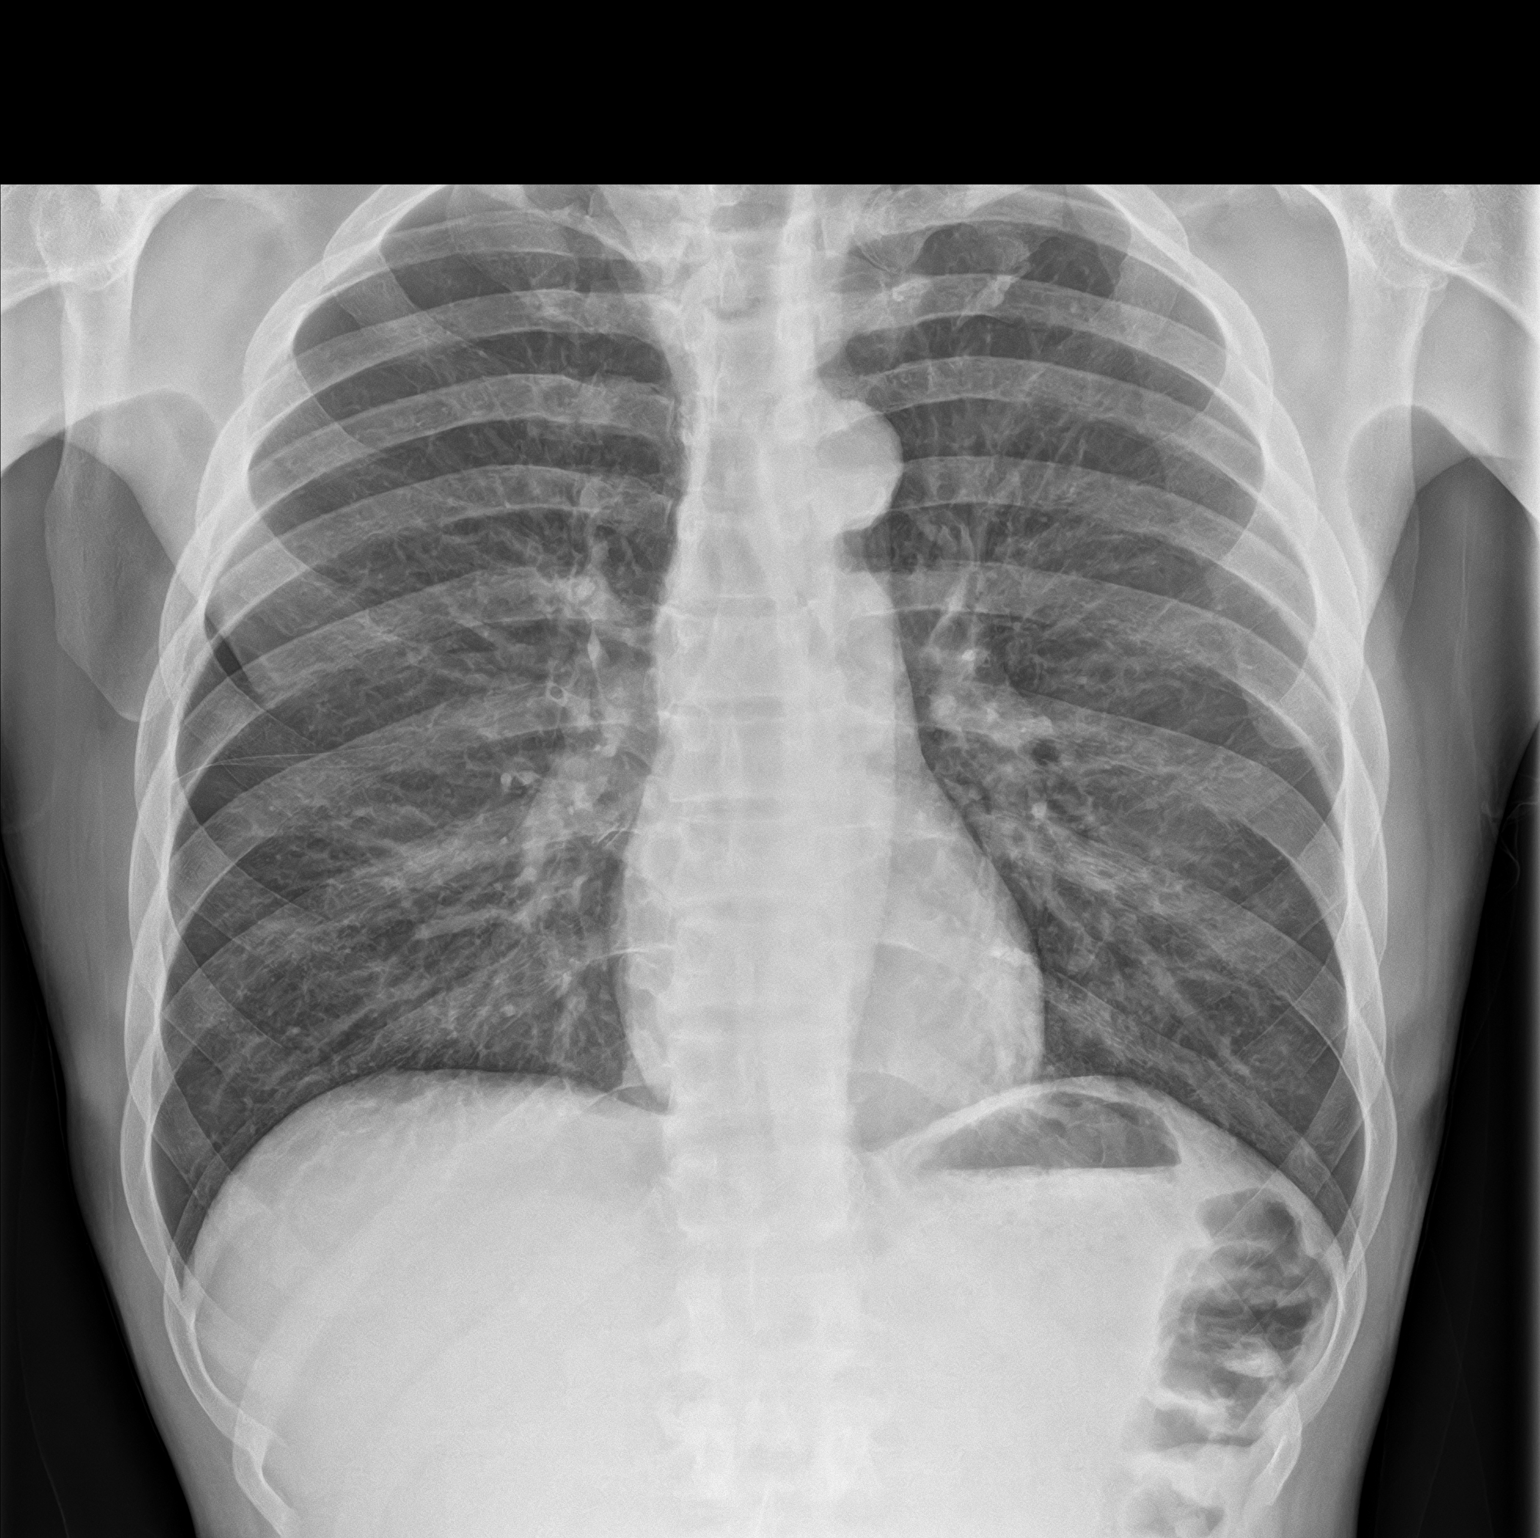
[im 2/2]
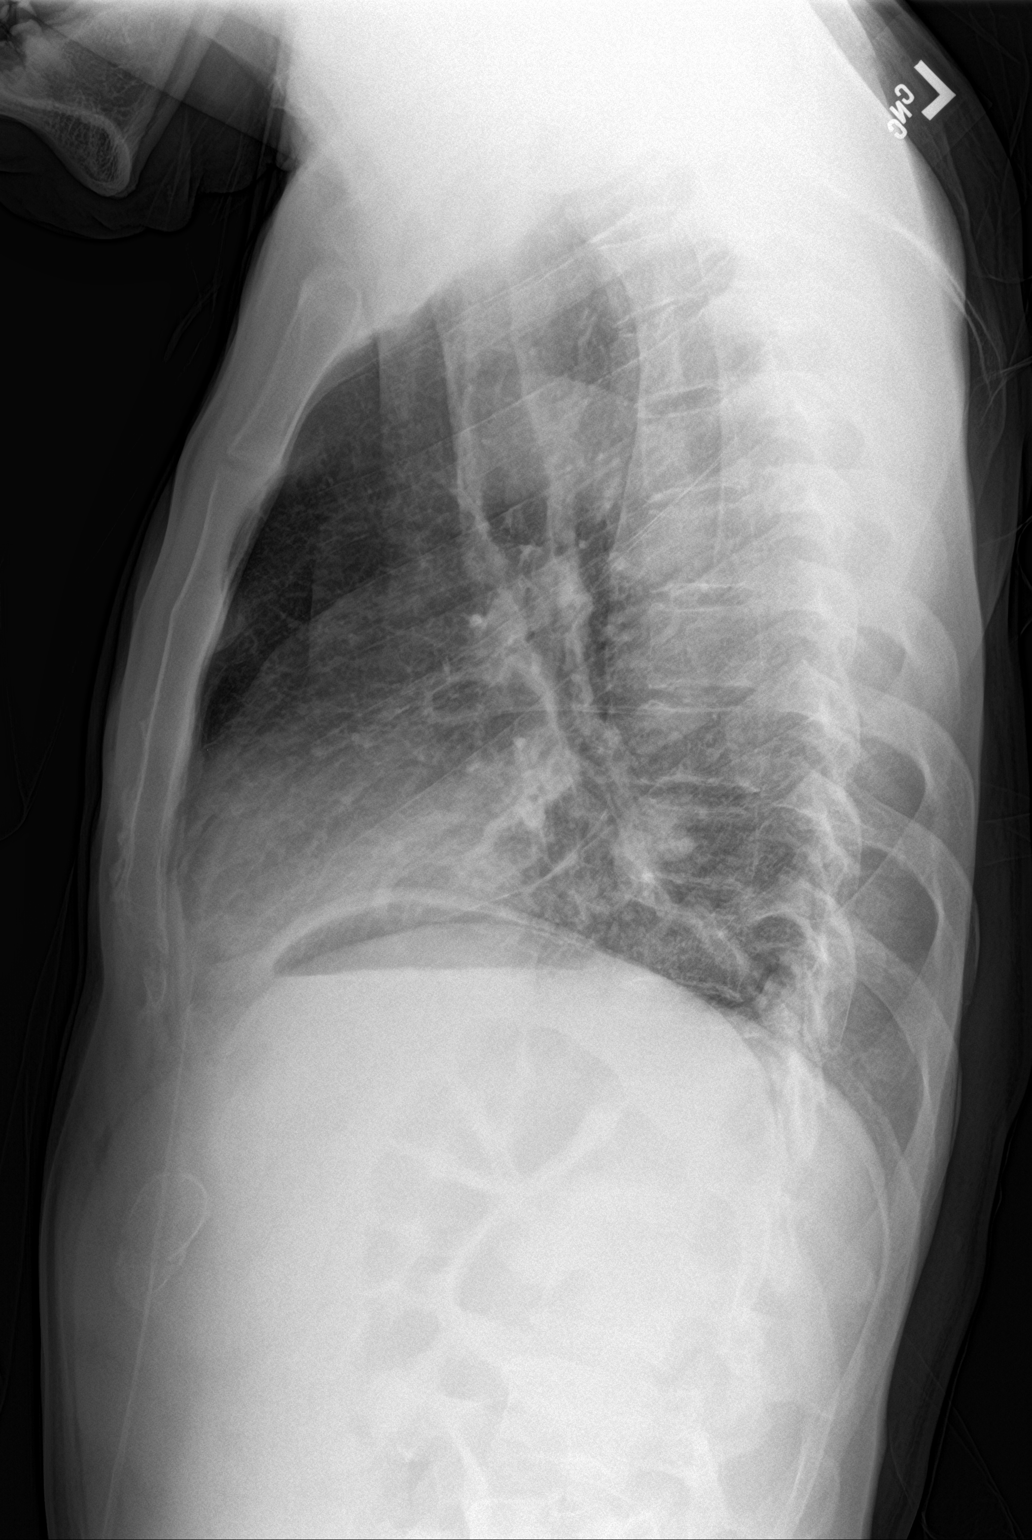

[2 of 2 positions shown; findings below may reference images not displayed]

FINDINGS: Normal sized heart. Clear lungs. Mild central peribronchial
thickening. Unremarkable bones.
IMPRESSION: Mild bronchitic changes.

## 2022-03-05 NOTE — Congregational Nurse Program (Signed)
  Dept: [phone]   Congregational Nurse Program Note  Date of Encounter: 03/05/2022 Client to [facility] day center with request for blood pressure check. Client is new to the center, no insurance and no PCP. He plans to return to center tomorrow for assistance with an [facility] application. No other needs at this time. Past Medical History: Past Medical History:  Diagnosis Date   Alcohol abuse     Encounter Details:  CNP Questionnaire - 03/05/22 1030       Questionnaire   Ask client: Do you give verbal consent for me to treat you today? Yes    Student Assistance N/A    Location Patient [place]    Visit Setting with Client Organization    Patient Status Unhoused    Insurance Uninsured ([employer]s/Self-Pay/Medicaid Family Planning)    Insurance/Financial Assistance Referral N/A    Medication N/A    Medical Provider No   plan to complete [facility] application at next visit   Screening Referrals Made N/A    Medical Referrals Made N/A    Medical Appointment Made N/A    Recently w/o PCP, now 1st time PCP visit completed due to CNs referral or appointment made N/A    Food N/A    Transportation N/A    Housing/Utilities No permanent housing    Interpersonal Safety N/A    Interventions Advocate/Support;Educate    Abnormal to Normal Screening Since Last CN Visit N/A    Screenings CN Performed Blood Pressure;Pulse Ox    Sent Client to Lab for: N/A    Did client attend any of the following based off CNs referral or appointments made? N/A    ED Visit Averted N/A    Life-Saving Intervention Made N/A

## 2022-03-12 ENCOUNTER — Ambulatory Visit: Payer: Self-pay | Admitting: Gerontology

## 2022-03-12 NOTE — Congregational Nurse Program (Signed)
  Dept: [phone]   Congregational Nurse Program Note  Date of Encounter: 03/11/2022 Client to [facility] day center with request for assistance in completing an application for [facility]. Application completed and to be dropped off by RN. Client does have a phone, RN advised client that the [facility] would contact him directly to make an appointment. No other needs at this time. Past Medical History: Past Medical History:  Diagnosis Date   Alcohol abuse     Encounter Details:  CNP Questionnaire - 03/11/22 0950       Questionnaire   Ask client: Do you give verbal consent for me to treat you today? Yes    Student Assistance N/A    Location Patient [place]    Visit Setting with Client Organization    Patient Status Unhoused    Insurance Uninsured ([employer]s/Self-Pay/Medicaid Family Planning)    Insurance/Financial Assistance Referral N/A    Medication N/A    Medical Provider No   plan to complete [facility] application at next visit   Screening Referrals Made N/A    Medical Referrals Made Non-[facility] PCP/Clinic   [facility] application completed   Medical Appointment Made N/A    Recently w/o PCP, now 1st time PCP visit completed due to CNs referral or appointment made N/A    Food N/A    Transportation N/A    Housing/Utilities No permanent housing    Interpersonal Safety N/A    Interventions Advocate/Support;Educate;Case Management    Abnormal to Normal Screening Since Last CN Visit N/A    Screenings CN Performed Blood Pressure;Pulse Ox    Sent Client to Lab for: N/A    Did client attend any of the following based off CNs referral or appointments made? N/A    ED Visit Averted N/A    Life-Saving Intervention Made N/A

## 2022-03-13 ENCOUNTER — Ambulatory Visit: Payer: Self-pay | Admitting: Gerontology

## 2022-03-19 ENCOUNTER — Ambulatory Visit: Payer: Self-pay | Admitting: Gerontology

## 2022-03-25 ENCOUNTER — Ambulatory Visit: Payer: Self-pay | Admitting: Gerontology

## 2022-04-01 NOTE — Congregational Nurse Program (Signed)
  Dept: [phone]   Congregational Nurse Program Note  Date of Encounter: 04/01/2022 Client to [facility] day center with request for assistance in making an appointment at the [facility]. He was unable to attend the appointment he had last week. Rn contacted [facility], client given an appointment for 2/15 at 3 pm. Appointment card given to client. No other needs at this time. Past Medical History: Past Medical History:  Diagnosis Date   Alcohol abuse     Encounter Details:  CNP Questionnaire - 04/01/22 1035       Questionnaire   Ask client: Do you give verbal consent for me to treat you today? Yes    Student Assistance N/A    Location Patient [place]    Visit Setting with Client Organization    Patient Status Unhoused    Insurance Uninsured ([employer]s/Self-Pay/Medicaid Family Planning)    Insurance/Financial Assistance Referral N/A    Medication N/A    Medical Provider No    Screening Referrals Made N/A    Medical Referrals Made Non-[facility] PCP/Clinic   [facility] application completed, client missed his forst apt   Medical Appointment Made Non-[facility] PCP/clinic   apt made at the [facility] for Thursday 2/15 at 3 pm.   Recently w/o PCP, now 1st time PCP visit completed due to CNs referral or appointment made N/A    Food N/A    Transportation N/A    Housing/Utilities No permanent housing    Interpersonal Safety N/A    Interventions Advocate/Support;Educate;Case Management    Abnormal to Normal Screening Since Last CN Visit N/A    Screenings CN Performed N/A    Sent Client to Lab for: N/A    Did client attend any of the following based off CNs referral or appointments made? N/A    ED Visit Averted N/A    Life-Saving Intervention Made N/A

## 2022-04-03 ENCOUNTER — Encounter: Payer: Self-pay | Admitting: Gerontology

## 2022-04-03 ENCOUNTER — Ambulatory Visit: Payer: Self-pay | Admitting: Gerontology

## 2022-04-03 VITALS — BP 123/88 | HR 88 | Temp 96.8°F | Wt 152.0 lb

## 2022-04-03 DIAGNOSIS — F17200 Nicotine dependence, unspecified, uncomplicated: Secondary | ICD-10-CM | POA: Insufficient documentation

## 2022-04-03 DIAGNOSIS — F1010 Alcohol abuse, uncomplicated: Secondary | ICD-10-CM | POA: Insufficient documentation

## 2022-04-03 DIAGNOSIS — IMO0001 Reserved for inherently not codable concepts without codable children: Secondary | ICD-10-CM | POA: Insufficient documentation

## 2022-04-03 DIAGNOSIS — Z7689 Persons encountering health services in other specified circumstances: Secondary | ICD-10-CM

## 2022-04-03 NOTE — Patient Instructions (Signed)
Smoking Tobacco Information, Adult Smoking tobacco can be harmful to your health. Tobacco contains a toxic colorless chemical called nicotine. Nicotine causes changes in your brain that make you want more and more. This is called addiction. This can make it hard to stop smoking once you start. Tobacco also has other toxic chemicals that can hurt your body and raise your risk of many cancers. Menthol or "lite" tobacco or cigarette brands are not safer than regular brands. How can smoking tobacco affect me? Smoking tobacco puts you at risk for: Cancer. Smoking is most commonly associated with lung cancer, but can also lead to cancer in other parts of the body. Chronic obstructive pulmonary disease (COPD). This is a long-term lung condition that makes it hard to breathe. It also gets worse over time. High blood pressure (hypertension), heart disease, stroke, heart attack, and lung infections, such as pneumonia. Cataracts. This is when the lenses in the eyes become clouded. Digestive problems. This may include peptic ulcers, heartburn, and gastroesophageal reflux disease (GERD). Oral health problems, such as gum disease, mouth sores, and tooth loss. Loss of taste and smell. Smoking also affects how you look and smell. Smoking may cause: Wrinkles. Yellow or stained teeth, fingers, and fingernails. Bad breath. Bad-smelling clothes and hair. Smoking tobacco can also affect your social life, because: It may be challenging to find places to smoke when away from home. Many workplaces, [employer], hotels, and public places are tobacco-free. Smoking is expensive. This is due to the cost of tobacco and the long-term costs of treating health problems from smoking. Secondhand smoke may affect those around you. Secondhand smoke can cause lung cancer, breathing problems, and heart disease. Children of smokers have a higher risk for: Sudden infant death syndrome (SIDS). Ear infections. Lung infections. What  actions can I take to prevent health problems? Quit smoking  Do not start smoking. Quit if you already smoke. Do not replace cigarette smoking with vaping devices, such as e-cigarettes. Make a plan to quit smoking and commit to it. Look for programs to help you, and ask your health care provider for recommendations and ideas. Set a date and write down all the reasons you want to quit. Let your friends and family know you are quitting so they can help and support you. Consider finding friends who also want to quit. It can be easier to quit with someone else, so that you can support each other. Talk with your health care provider about using nicotine replacement medicines to help you quit. These include gum, lozenges, patches, sprays, or pills. If you try to quit but return to smoking, stay positive. It is common to slip up when you first quit, so take it one day at a time. Be prepared for cravings. When you feel the urge to smoke, chew gum or suck on hard candy. Lifestyle Stay busy. Take care of your body. Get plenty of exercise, eat a healthy diet, and drink plenty of water. Find ways to manage your stress, such as meditation, yoga, exercise, or time spent with friends and family. Ask your health care provider about having regular tests (screenings) to check for cancer. This may include blood tests, imaging tests, and other tests. Where to find support To get support to quit smoking, consider: Asking your health care provider for more information and resources. Joining a support group for people who want to quit smoking in your local community. There are many effective programs that may help you to quit. Calling the smokefree.gov counselor  helpline at 1-800-QUIT-NOW (229)869-3181). Where to find more information You may find more information about quitting smoking from: Centers for Disease Control and Prevention: https://www.chang-huffman.com/ https://hall.com/: smokefree.gov American Lung Association:  freedomfromsmoking.org Contact a health care provider if: You have problems breathing. Your lips, nose, or fingers turn blue. You have chest pain. You are coughing up blood. You feel like you will faint. You have other health changes that cause you to worry. Summary Smoking tobacco can negatively affect your health, the health of those around you, your finances, and your social life. Do not start smoking. Quit if you already smoke. If you need help quitting, ask your health care provider. Consider joining a support group for people in your local community who want to quit smoking. There are many effective programs that may help you to quit. This information is not intended to replace advice given to you by your health care provider. Make sure you discuss any questions you have with your health care provider. Document Revised: 01/29/2021 Document Reviewed: 01/29/2021 Elsevier Patient Education  [place].

## 2022-04-03 NOTE — Progress Notes (Signed)
New Patient Office Visit  Subjective    Patient ID: Richard Estes, male    DOB: [birth year]  Age: 62 y.o. MRN: [id number]  CC: No chief complaint on file.   HPI ROSHON Estes is a 62 y/o male who has no significant medical history and presents to establish care. He states that he needs Physical examination . He reports he's a social drinker, he drinks beer and liquor, drinks 3 cans of 16 oz beer daily and denies withdrawal symptoms, right upper quadrant pain. He denies illicit drug use, smokes  6-10 cigarettes a day and admits the desire to quit. He denies chest pain, palpitation, light headedness and blurry vision. Overall, he states that he's doing well and offers no further complaint.   Outpatient Encounter Medications as of 04/03/2022  Medication Sig   [DISCONTINUED] clindamycin (CLEOCIN) 300 MG capsule Take 1 capsule (300 mg total) by mouth 3 (three) times daily. (Patient not taking: Reported on 12/25/2016)   [DISCONTINUED] clotrimazole (LOTRIMIN) 1 % cream Apply 1 application 2 (two) times daily topically. (Patient not taking: Reported on 04/03/2022)   No facility-administered encounter medications on file as of 04/03/2022.    Past Medical History:  Diagnosis Date   Alcohol abuse     No past surgical history on file.  Family History  Problem Relation Age of Onset   Arthritis Mother     Social History   Socioeconomic History   Marital status: Legally Separated    Spouse name: Not on file   Number of children: Not on file   Years of education: Not on file   Highest education level: Not on file  Occupational History   Not on file  Tobacco Use   Smoking status: Every Day    Packs/day: 1.00    Types: Cigarettes   Smokeless tobacco: Never   Tobacco comments:    Sometimes list  Substance and Sexual Activity   Alcohol use: No    Comment: no alcohol since 08/09/16   Drug use: No    Comment: last used marijuana 08/09/16   Sexual activity: Not on file  Other Topics  Concern   Not on file  Social History Narrative   Not on file   Social Determinants of Health   Financial Resource Strain: Not on file  Food Insecurity: Not on file  Transportation Needs: Not on file  Physical Activity: Not on file  Stress: Not on file  Social Connections: Not on file  Intimate Partner Violence: Not on file    Review of Systems  Constitutional: Negative.   HENT: Negative.    Eyes: Negative.   Respiratory: Negative.    Cardiovascular: Negative.   Gastrointestinal: Negative.   Genitourinary: Negative.   Musculoskeletal: Negative.   Skin: Negative.   Neurological: Negative.   Endo/Heme/Allergies: Negative.   Psychiatric/Behavioral: Negative.          Objective    BP 123/88 (BP Location: Right Arm, Patient Position: Sitting, Cuff Size: Large)   Pulse 88   Temp (!) 96.8 F (36 C)   Wt 152 lb (68.9 kg)   SpO2 98%   BMI 22.45 kg/m   Physical Exam HENT:     Head: Normocephalic and atraumatic.     Nose: Nose normal.     Mouth/Throat:     Mouth: Mucous membranes are moist.  Eyes:     Extraocular Movements: Extraocular movements intact.     Conjunctiva/sclera: Conjunctivae normal.     Pupils: Pupils are equal,  round, and reactive to light.  Cardiovascular:     Rate and Rhythm: Normal rate and regular rhythm.     Pulses: Normal pulses.     Heart sounds: Normal heart sounds.  Pulmonary:     Effort: Pulmonary effort is normal.     Breath sounds: Normal breath sounds.  Abdominal:     General: Abdomen is flat. Bowel sounds are normal.     Palpations: Abdomen is soft.  Genitourinary:    Comments: Deferred per patient Musculoskeletal:        General: Normal range of motion.     Cervical back: Normal range of motion.  Skin:    General: Skin is warm.  Neurological:     General: No focal deficit present.     Mental Status: He is alert and oriented to person, place, and time. Mental status is at baseline.  Psychiatric:        Mood and Affect:  Mood normal.        Behavior: Behavior normal.        Thought Content: Thought content normal.        Judgment: Judgment normal.         Assessment & Plan:   1. Encounter to establish care - Routine labs will be checked - CBC w/Diff; Future - Comp Met (CMET); Future - Lipid panel; Future - HgB A1c; Future - PSA; Future - Urinalysis; Future  2. Smoking - He was provided with NCQuitline information and encouraged to call   3. Alcohol abuse - He was encouraged on alcohol abstinence   Return in about 4 weeks (around 05/01/2022), or if symptoms worsen or fail to improve.   Richard Stuck Jerold Coombe, NP

## 2022-04-09 ENCOUNTER — Other Ambulatory Visit: Payer: Self-pay

## 2022-05-01 ENCOUNTER — Ambulatory Visit: Payer: Self-pay | Admitting: Gerontology

## 2022-07-04 ENCOUNTER — Other Ambulatory Visit: Payer: Self-pay
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
## 2022-09-15 ENCOUNTER — Other Ambulatory Visit: Payer: Self-pay

## 2022-09-15 ENCOUNTER — Encounter: Payer: Self-pay | Admitting: Intensive Care

## 2022-09-15 ENCOUNTER — Emergency Department
Admission: EM | Admit: 2022-09-15 | Discharge: 2022-09-15 | Disposition: A | Discharge status: Home / Self Care | Payer: BLUE CROSS/BLUE SHIELD | Attending: Emergency Medicine | Admitting: Emergency Medicine

## 2022-09-15 DIAGNOSIS — R55 Syncope and collapse: Secondary | ICD-10-CM | POA: Diagnosis present

## 2022-09-15 LAB — CBC WITH DIFFERENTIAL/PLATELET
Abs Immature Granulocytes: 0.01 10*3/uL (ref 0.00–0.07)
Basophils Absolute: 0 10*3/uL (ref 0.0–0.1)
Basophils Relative: 0 %
Eosinophils Absolute: 0 10*3/uL (ref 0.0–0.5)
Eosinophils Relative: 1 %
HCT: 38.9 % — ABNORMAL LOW (ref 39.0–52.0)
Hemoglobin: 12.8 g/dL — ABNORMAL LOW (ref 13.0–17.0)
Immature Granulocytes: 0 %
Lymphocytes Relative: 41 %
Lymphs Abs: 1.9 10*3/uL (ref 0.7–4.0)
MCH: 31.1 pg (ref 26.0–34.0)
MCHC: 32.9 g/dL (ref 30.0–36.0)
MCV: 94.4 fL (ref 80.0–100.0)
Monocytes Absolute: 0.6 10*3/uL (ref 0.1–1.0)
Monocytes Relative: 12 %
Neutro Abs: 2.1 10*3/uL (ref 1.7–7.7)
Neutrophils Relative %: 46 %
Platelets: 194 10*3/uL (ref 150–400)
RBC: 4.12 MIL/uL — ABNORMAL LOW (ref 4.22–5.81)
RDW: 12.2 % (ref 11.5–15.5)
WBC: 4.7 10*3/uL (ref 4.0–10.5)
nRBC: 0 % (ref 0.0–0.2)

## 2022-09-15 LAB — BASIC METABOLIC PANEL
Anion gap: 11 (ref 5–15)
BUN: 5 mg/dL — ABNORMAL LOW (ref 8–23)
CO2: 25 mmol/L (ref 22–32)
Calcium: 8.3 mg/dL — ABNORMAL LOW (ref 8.9–10.3)
Chloride: 100 mmol/L (ref 98–111)
Creatinine, Ser: 0.81 mg/dL (ref 0.61–1.24)
GFR, Estimated: >60 mL/min (ref >60)
Glucose, Bld: 258 mg/dL — ABNORMAL HIGH (ref 70–99)
Potassium: 3.3 mmol/L — ABNORMAL LOW (ref 3.5–5.1)
Sodium: 136 mmol/L (ref 135–145)

## 2022-09-15 NOTE — Discharge Instructions (Signed)
Your blood work and EKG are normal. Please follow up with your outpatient provider. Return for any new, worsening, or change in symptoms or other concerns.

## 2022-09-15 NOTE — ED Provider Notes (Signed)
[facility] Provider Note    Event Date/Time   First MD Initiated Contact with Patient 09/15/22 1157     (approximate)   History   Dizziness and Hypertension   HPI  Richard Estes is a 62 y.o. male with a past medical history of alcohol abuse who presents today for evaluation of feeling lightheaded this morning.  Patient reports that he got out of bed and felt lightheaded which has resolved.  He reports that it improved when he sat down.  He denies chest pain or shortness of breath.  He reports that he had 1 beer and a small glass of wine this morning which is usual for him.  He denies history of alcohol withdrawal.  He does not feel shaky now.  He reports that he drank more beer than water this weekend.  His last drink was 4 hours ago.  He denies any trouble walking.  Patient Active Problem List   Diagnosis Date Noted   Encounter to establish care 04/03/2022   Smoking 04/03/2022   Alcohol abuse 04/03/2022          Physical Exam   Triage Vital Signs: ED Triage Vitals  Encounter Vitals Group     BP 09/15/22 1135 (!) 144/109     Systolic BP Percentile --      Diastolic BP Percentile --      Pulse Rate 09/15/22 1135 84     Resp 09/15/22 1135 16     Temp 09/15/22 1135 98 F (36.7 C)     Temp Source 09/15/22 1135 Oral     SpO2 09/15/22 1135 99 %     Weight 09/15/22 1136 150 lb (68 kg)     Height 09/15/22 1136 5\' 9"  (1.753 m)     Head Circumference --      Peak Flow --      Pain Score 09/15/22 1136 0     Pain Loc --      Pain Education --      Exclude from Growth Chart --     Most recent vital signs: Vitals:   09/15/22 1135 09/15/22 1430  BP: (!) 144/109 (!) 152/107  Pulse: 84 80  Resp: 16 18  Temp: 98 F (36.7 C)   SpO2: 99% 98%    Physical Exam Vitals and nursing note reviewed.  Constitutional:      General: Awake and alert. No acute distress.    Appearance: Normal appearance. The patient is normal weight.  HENT:     Head:  Normocephalic and atraumatic.     Mouth: Mucous membranes are moist.  Eyes:     General: PERRL. Normal EOMs        Right eye: No discharge.        Left eye: No discharge.     Conjunctiva/sclera: Conjunctivae normal.  Cardiovascular:     Rate and Rhythm: Normal rate and regular rhythm.     Pulses: Normal pulses.  Equal in all 4 extremities Pulmonary:     Effort: Pulmonary effort is normal. No respiratory distress.     Breath sounds: Normal breath sounds.  Abdominal:     Abdomen is soft. There is no abdominal tenderness. No rebound or guarding. No distention. Musculoskeletal:        General: No swelling. Normal range of motion.     Cervical back: Normal range of motion and neck supple.  Skin:    General: Skin is warm and dry.  Capillary Refill: Capillary refill takes less than 2 seconds.     Findings: No rash.  Neurological:     Mental Status: The patient is awake and alert.   Neurological: GCS 15 alert and oriented x3 Normal speech, no expressive or receptive aphasia or dysarthria Cranial nerves II through XII intact Normal visual fields 5 out of 5 strength in all 4 extremities with intact sensation throughout No extremity drift Normal finger-to-nose testing, no limb or truncal ataxia    ED Results / Procedures / Treatments   Labs (all labs ordered are listed, but only abnormal results are displayed) Labs Reviewed  BASIC METABOLIC PANEL - Abnormal; Notable for the following components:      Result Value   Potassium 3.3 (*)    Glucose, Bld 258 (*)    BUN 5 (*)    Calcium 8.3 (*)    All other components within normal limits  CBC WITH DIFFERENTIAL/PLATELET - Abnormal; Notable for the following components:   RBC 4.12 (*)    Hemoglobin 12.8 (*)    HCT 38.9 (*)    All other components within normal limits     EKG     RADIOLOGY     PROCEDURES:  Critical Care performed:   Procedures   MEDICATIONS ORDERED IN ED: Medications - No data to  display   IMPRESSION / MDM / ASSESSMENT AND PLAN / ED COURSE  I reviewed the triage vital signs and the nursing notes.   Differential diagnosis includes, but is not limited to, dehydration, alcohol intoxication, electrolyte disarray, anemia, arrhythmia.  Patient is awake and alert, hemodynamically stable and afebrile.  He is neurologically intact.  He is ambulatory with steady gait.  He is neurologically intact.  EKG and labs obtained.  EKG and labs are overall reassuring.  Patient is ambulatory with a steady gait, tolerating p.o. without difficulty.  He is reassured by his findings.  We discussed the importance of cutting down on his overall alcohol intake.  There are no signs of alcohol withdrawal at this time.  There is no tachycardia, no gait instability, no tongue fasciculations.  Recommended close outpatient follow-up and return precautions.  Patient understands and agrees with plan.  He was discharged in stable condition.   Patient's presentation is most consistent with acute complicated illness / injury requiring diagnostic workup.      FINAL CLINICAL IMPRESSION(S) / ED DIAGNOSES   Final diagnoses:  Near syncope     Rx / DC Orders   ED Discharge Orders     None        Note:  This document was prepared using Dragon voice recognition software and may include unintentional dictation errors.   Jackelyn Hoehn, PA-C 09/15/22 1513    Phineas Semen, MD 09/15/22 [id number]

## 2022-09-15 NOTE — ED Triage Notes (Addendum)
Patient reports feeling dizzy this AM upon standing. Denies pain or any other symptoms. Denies vision changes

## 2022-10-07 NOTE — Congregational Nurse Program (Signed)
  Dept: [phone]   Congregational Nurse Program Note  Date of Encounter: 10/07/2022 Client to [facility] day center with request for blood pressure check. BP (!) 140/92 (BP Location: Left Arm, Patient Position: Sitting, Cuff Size: Normal)   Pulse 91   SpO2 100%. Client was last seen at [facility] in February. Client agreeable to RN contacting the [facility] to make and apt. Appointment given for 8/21 at 10:30 am. [place] bus passes given for transportation. Francesco Runner BSN, RN  Past Medical History: Past Medical History:  Diagnosis Date   Alcohol abuse     Encounter Details:  CNP Questionnaire - 10/07/22 1000       Questionnaire   Ask client: Do you give verbal consent for me to treat you today? Yes    Student Assistance N/A    Location Patient Served  [facility]    Visit Setting with Client Organization    Patient Status Unhoused    Insurance Uninsured ([employer] Card/Care Connects/Self-Pay/Medicaid Family Planning)    Insurance/Financial Assistance Referral N/A   RN to assist with Medicaid application   Medication N/A    Medical Provider Yes   [facility]   Screening Referrals Made N/A    Medical Referrals Made N/A    Medical Appointment Made Non-[facility] PCP/clinic   [facility] apt made for 8/21 at 10:30 am   Recently w/o PCP, now 1st time PCP visit completed due to CNs referral or appointment made N/A    Food N/A    Transportation Need transportation assistance   [place] bus passes [facility]   Housing/Utilities No permanent housing    Interpersonal Safety N/A    Interventions Advocate/Support;Educate    Abnormal to Normal Screening Since Last CN Visit N/A    Screenings CN Performed Blood Pressure;Pulse Ox    Sent Client to Lab for: N/A    Did client attend any of the following based off CNs referral or appointments made? N/A    ED Visit Averted N/A    Life-Saving Intervention Made N/A

## 2022-10-08 ENCOUNTER — Encounter: Payer: Self-pay | Admitting: Gerontology

## 2022-10-08 ENCOUNTER — Other Ambulatory Visit: Payer: Self-pay

## 2022-10-08 ENCOUNTER — Ambulatory Visit: Payer: Self-pay | Admitting: Gerontology

## 2022-10-08 VITALS — BP 110/77 | HR 83 | Ht 69.0 in | Wt 146.0 lb

## 2022-10-08 DIAGNOSIS — Z7689 Persons encountering health services in other specified circumstances: Secondary | ICD-10-CM

## 2022-10-08 DIAGNOSIS — E119 Type 2 diabetes mellitus without complications: Secondary | ICD-10-CM

## 2022-10-08 DIAGNOSIS — Z0000 Encounter for general adult medical examination without abnormal findings: Secondary | ICD-10-CM

## 2022-10-08 DIAGNOSIS — Z91030 Bee allergy status: Secondary | ICD-10-CM

## 2022-10-08 LAB — POCT GLYCOSYLATED HEMOGLOBIN (HGB A1C): Hemoglobin A1C: 9.6 % — AB (ref 4.0–5.6)

## 2022-10-08 LAB — GLUCOSE, POCT (MANUAL RESULT ENTRY): POC Glucose: 445 mg/dl — AB (ref 70–99)

## 2022-10-08 MED ORDER — TRUEPLUS LANCETS 33G MISC
1.0000 | Freq: Four times a day (QID) | 0 refills | Status: AC
  Filled 2022-10-08 – 2022-10-29 (×3): qty 100, 25d supply, fill #0

## 2022-10-08 MED ORDER — EPINEPHRINE 0.3 MG/0.3ML IJ SOAJ
0.3000 mg | INTRAMUSCULAR | 5 refills | Status: DC | PRN
Start: 2022-10-08 — End: 2023-06-18
  Filled 2022-10-08 (×2): qty 2, 30d supply, fill #0

## 2022-10-08 MED ORDER — TRUE METRIX METER W/DEVICE KIT
1.0000 | PACK | 0 refills | Status: DC
Start: 2022-10-08 — End: 2023-12-22
  Filled 2022-10-08 (×2): qty 1, 30d supply, fill #0
  Filled 2022-10-29 (×2): qty 1, 1d supply, fill #0

## 2022-10-08 MED ORDER — BLOOD GLUCOSE TEST VI STRP
1.0000 | ORAL_STRIP | Freq: Four times a day (QID) | 0 refills | Status: DC
  Filled 2022-10-08 – 2022-10-29 (×3): qty 100, 25d supply, fill #0

## 2022-10-08 MED ORDER — METFORMIN HCL 500 MG PO TABS
500.0000 mg | ORAL_TABLET | Freq: Two times a day (BID) | ORAL | 0 refills | Status: DC
Start: 2022-10-08 — End: 2023-03-18
  Filled 2022-10-08 (×2): qty 30, 30d supply, fill #0
  Filled 2022-10-29: qty 60, 30d supply, fill #0

## 2022-10-08 NOTE — Progress Notes (Signed)
Established Patient Office Visit  Subjective   Patient ID: Richard Estes, male    DOB: Jun 02, 1960  Age: 62 y.o. MRN: [id number]  Chief Complaint  Patient presents with   Follow-up    HPI  Richard Estes is a 62 y/o male who has no significant medical history and presents for follow up. He was seen at the ED on 09/15/2022 for near syncopal episode. At today's visit, he denies dizziness, syncope, headache, chest pain, and palpitation. His blood glucose was 445 mg/dL and Hgb Y7W 2.9%. He didn't know he has hyperglycemia. He states he has symptom of polyuria, polyphagia, and polydipsia, as well as sometimes blurry vision. He urinated every hour last night and feels tired today. He denies peripheral neuropathy and performs daily foot checks. He reports that he smokes 10-12 cigarettes/day and admits the desire to quit and drinks  2-3 beers/day, but no illicit drug use. Overall, he states that he's doing well and offers no further complaint.    Review of Systems  Constitutional: Negative.   HENT: Negative.    Eyes: Negative.   Gastrointestinal: Negative.   Genitourinary:  Negative for frequency.       Urinary frequency due to drink a lot fluid  Skin: Negative.   Neurological: Negative.   Endo/Heme/Allergies: Negative.   Psychiatric/Behavioral: Negative.        Objective:     There were no vitals taken for this visit. BP Readings from Last 3 Encounters:  10/08/22 110/77  10/07/22 (!) 140/92  09/15/22 (!) 152/107   Wt Readings from Last 3 Encounters:  10/08/22 146 lb (66.2 kg)  09/15/22 150 lb (68 kg)  04/03/22 152 lb (68.9 kg)      Physical Exam Constitutional:      Appearance: Normal appearance.  HENT:     Head: Normocephalic.     Right Ear: Tympanic membrane normal.     Left Ear: Tympanic membrane normal.     Nose: Nose normal.     Mouth/Throat:     Mouth: Mucous membranes are moist.  Eyes:     Extraocular Movements: Extraocular movements intact.     Pupils:  Pupils are equal, round, and reactive to light.  Cardiovascular:     Rate and Rhythm: Normal rate and regular rhythm.     Pulses: Normal pulses.     Heart sounds: Normal heart sounds.  Pulmonary:     Effort: Pulmonary effort is normal.     Breath sounds: Normal breath sounds.  Abdominal:     General: Abdomen is flat.     Palpations: Abdomen is soft.  Genitourinary:    Comments: Patient deferred Musculoskeletal:        General: Normal range of motion.     Cervical back: Normal range of motion and neck supple.  Skin:    General: Skin is warm and dry.  Neurological:     General: No focal deficit present.     Mental Status: He is alert and oriented to person, place, and time.  Psychiatric:        Mood and Affect: Mood normal.        Behavior: Behavior normal.    No results found for any visits on 10/08/22.  Last CBC Lab Results  Component Value Date   WBC 4.7 09/15/2022   HGB 12.8 (L) 09/15/2022   HCT 38.9 (L) 09/15/2022   MCV 94.4 09/15/2022   MCH 31.1 09/15/2022   RDW 12.2 09/15/2022   PLT 194 09/15/2022  Last metabolic panel Lab Results  Component Value Date   GLUCOSE 258 (H) 09/15/2022   NA 136 09/15/2022   K 3.3 (L) 09/15/2022   CL 100 09/15/2022   CO2 25 09/15/2022   BUN 5 (L) 09/15/2022   CREATININE 0.81 09/15/2022   GFRNONAA >60 09/15/2022   CALCIUM 8.3 (L) 09/15/2022   PROT 7.2 12/25/2016   ALBUMIN 4.5 12/25/2016   LABGLOB 2.7 12/25/2016   AGRATIO 1.7 12/25/2016   BILITOT 0.4 12/25/2016   ALKPHOS 58 12/25/2016   AST 27 12/25/2016   ALT 25 12/25/2016   ANIONGAP 11 09/15/2022   Last lipids Lab Results  Component Value Date   CHOL 157 12/25/2016   HDL 36 (L) 12/25/2016   LDLCALC 90 12/25/2016   TRIG 154 (H) 12/25/2016   CHOLHDL 4.4 12/25/2016   Last hemoglobin A1c Lab Results  Component Value Date   HGBA1C 9.6 (A) 10/08/2022   Last thyroid functions No results found for: "TSH", "T3TOTAL", "T4TOTAL", "THYROIDAB" Last vitamin D No results  found for: "25OHVITD2", "25OHVITD3", "VD25OH" Last vitamin B12 and Folate No results found for: "VITAMINB12", "FOLATE"    The ASCVD Risk score (Arnett DK, et al., 2019) failed to calculate for the following reasons:   Cannot find a previous HDL lab   Cannot find a previous total cholesterol lab    Assessment & Plan:  1. Diabetes mellitus without complication (HCC) His HgbA1c was 9.6%, and his goal should be less than 7%. He was started on Metformin 500 mg po BID and was educated on medication side effects and advised to notify clinic. He was advised to check fasting blood glucose and at bedtime and bring the log of blood sugar to the next office visit . He was educated on how to check blood glucose and he returned demonstration. He was encouraged to continue on low carb/ non concentrated sweet diet and exercise 150 minutes a week as tolerated.  - POCT HgB A1C - POCT Glucose (CBG) - Urine Microalbumin w/creat. ratio - Urinalysis - metFORMIN (GLUCOPHAGE) 1000 MG tablet; Take 0.5 tablets (500 mg total) by mouth 2 (two) times daily with a meal.  Dispense: 30 tablet; Refill: 0 - Blood Glucose Monitoring Suppl (FREESTYLE FREEDOM LITE) w/Device KIT; 1 each by Other route as directed. Use to check blood glucose up to four times daily  Dispense: 1 kit; Refill: 0  2. 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 He used Epipen before. He was instructed how to use Epipen - EPINEPHrine 0.3 mg/0.3 mL IJ SOAJ injection; Inject 0.3 mg into the muscle as needed for anaphylaxis.  Dispense: 2 each; Refill: 5   Follow up in 4 week (11/05/2022) in clinic to review home blood glucose number and check Metformin tolerance.    Odette Fraction, NP

## 2022-10-08 NOTE — Patient Instructions (Signed)
Managing the Challenge of Quitting Smoking Quitting smoking is a physical and mental challenge. You may have cravings, withdrawal symptoms, and temptation to smoke. Before quitting, work with your health care provider to make a plan that can help you manage quitting. Making a plan before you quit may keep you from smoking when you have the urge to smoke while trying to quit. How to manage lifestyle changes Managing stress Stress can make you want to smoke, and wanting to smoke may cause stress. It is important to find ways to manage your stress. You could try some of the following: Practice relaxation techniques. Breathe slowly and deeply, in through your nose and out through your mouth. Listen to music. Soak in a bath or take a shower. Imagine a peaceful place or vacation. Get some support. Talk with family or friends about your stress. Join a support group. Talk with a counselor or therapist. Get some physical activity. Go for a walk, run, or bike ride. Play a favorite sport. Practice yoga.  Medicines Talk with your health care provider about medicines that might help you deal with cravings and make quitting easier for you. Relationships Social situations can be difficult when you are quitting smoking. To manage this, you can: Avoid parties and other social situations where people might be smoking. Avoid alcohol. Leave right away if you have the urge to smoke. Explain to your family and friends that you are quitting smoking. Ask for support and let them know you might be a bit grumpy. Plan activities where smoking is not an option. General instructions Be aware that many people gain weight after they quit smoking. However, not everyone does. To keep from gaining weight, have a plan in place before you quit, and stick to the plan after you quit. Your plan should include: Eating healthy snacks. When you have a craving, it may help to: Eat popcorn, or try carrots, celery, or other cut  vegetables. Chew sugar-free gum. Changing how you eat. Eat small portion sizes at meals. Eat 4-6 small meals throughout the day instead of 1-2 large meals a day. Be mindful when you eat. You should avoid watching television or doing other things that might distract you as you eat. Exercising regularly. Make time to exercise each day. If you do not have time for a long workout, do short bouts of exercise for 5-10 minutes several times a day. Do some form of strengthening exercise, such as weight lifting. Do some exercise that gets your heart beating and causes you to breathe deeply, such as walking fast, running, swimming, or biking. This is very important. Drinking plenty of water or other low-calorie or no-calorie drinks. Drink enough fluid to keep your urine pale yellow.  How to recognize withdrawal symptoms Your body and mind may experience discomfort as you try to get used to not having nicotine in your system. These effects are called withdrawal symptoms. They may include: Feeling hungrier than normal. Having trouble concentrating. Feeling irritable or restless. Having trouble sleeping. Feeling depressed. Craving a cigarette. These symptoms may surprise you, but they are normal to have when quitting smoking. To manage withdrawal symptoms: Avoid places, people, and activities that trigger your cravings. Remember why you want to quit. Get plenty of sleep. Avoid coffee and other drinks that contain caffeine. These may worsen some of your symptoms. How to manage cravings Come up with a plan for how to deal with your cravings. The plan should include the following: A definition of the specific situation  you want to deal with. An activity or action you will take to replace smoking. A clear idea for how this action will help. The name of someone who could help you with this. Cravings usually last for 5-10 minutes. Consider taking the following actions to help you with your plan to deal  with cravings: Keep your mouth busy. Chew sugar-free gum. Suck on hard candies or a straw. Brush your teeth. Keep your hands and body busy. Change to a different activity right away. Squeeze or play with a ball. Do an activity or a hobby, such as making bead jewelry, practicing needlepoint, or working with wood. Mix up your normal routine. Take a short exercise break. Go for a quick walk, or run up and down stairs. Focus on doing something kind or helpful for someone else. Call a friend or family member to talk during a craving. Join a support group. Contact a quitline. Where to find support To get help or find a support group: Call the National Cancer Institute's Smoking Quitline: 1-800-QUIT-NOW (203)593-1634) Text QUIT to SmokefreeTXT: 454098 Where to find more information Visit these websites to find more information on quitting smoking: [place]. Department of Health and Human Services: www.smokefree.gov American Lung Association: www.freedomfromsmoking.org Centers for Disease Control and Prevention (CDC): FootballExhibition.com.br American Heart Association: www.heart.org Contact a health care provider if: You want to change your plan for quitting. The medicines you are taking are not helping. Your eating feels out of control or you cannot sleep. You feel depressed or become very anxious. Summary Quitting smoking is a physical and mental challenge. You will face cravings, withdrawal symptoms, and temptation to smoke again. Preparation can help you as you go through these challenges. Try different techniques to manage stress, handle social situations, and prevent weight gain. You can deal with cravings by keeping your mouth busy (such as by chewing gum), keeping your hands and body busy, calling family or friends, or contacting a quitline for people who want to quit smoking. You can deal with withdrawal symptoms by avoiding places where people smoke, getting plenty of rest, and avoiding drinks that  contain caffeine. This information is not intended to replace advice given to you by your health care provider. Make sure you discuss any questions you have with your health care provider. Document Revised: 01/25/2021 Document Reviewed: 01/25/2021 Elsevier Patient Education  2024 Elsevier Inc. Alcohol Intoxication Alcohol intoxication occurs when a person no longer thinks clearly or functions well after drinking alcohol. This is also referred to as becoming impaired. Intoxication can occur with just one drink. The legal definition of alcohol intoxication depends on the amount of alcohol in the blood (blood alcohol concentration, BAC). BAC of 80-100 mg/dL or higher is commonly considered legally intoxicated. The level of impairment depends on: The amount of alcohol the person had. The person's age, gender, and weight. How often the person drinks. Whether the person has other medical conditions, such as diabetes, seizures, or a heart condition. Alcohol intoxication can range from mild to severe. The condition can be dangerous, especially if the person: Also took certain drugs or prescription medicines. Drinks a large amount of alcohol in a short period of time (binge drinks). For women, binge drinking is having four or more drinks at one time. For men, binge drinking is having five or more drinks at one time. If you or anyone around you appears intoxicated, speak up and act. What are the causes? This condition is caused by drinking alcohol. What increases the risk? The  following factors may make you more likely to develop this condition: Peer pressure in young adults. Difficulty managing stress. History of drug or alcohol abuse. Family history of drug or alcohol abuse. Combining alcohol with drugs. Low body weight. Binge drinking. What are the signs or symptoms? Symptoms of alcohol intoxication can vary from person to person. Symptoms can be mild, moderate, or severe. Symptoms of mild  alcohol intoxication may include: Feeling relaxed or sleepy. Having mild difficulty with coordination, speech, memory, or attention. Symptoms of moderate alcohol intoxication may include: Strong anger or extreme sadness. Moderate difficulty with coordination, speech, memory, or attention. Symptoms of severe alcohol intoxication may include: Severe difficulty with coordination, speech, memory, or attention. Passing out. Vomiting. Confusion. Slow breathing. Coma. Intoxication can change quickly from mild to severe. It can cause coma or death, especially in people who do not drink alcohol often. How is this diagnosed? Your health care provider will ask you how much alcohol you drank and what kind you had. Intoxication may also be diagnosed based on: Your symptoms and medical history. A physical exam. A blood test that measures BAC. A smell of alcohol on your breath. How is this treated? Treatment for alcohol intoxication may include: Being monitored in an emergency department, hospital, or treatment center until your [facility] comes down and it is safe for you to go home. IV fluids to prevent or treat loss of fluid in the body (dehydration). Medicine to treat nausea or vomiting or to get rid of alcohol in the body. Counseling about the dangers of using alcohol. Treatment for substance use disorder. Oxygen therapy or a breathing machine (ventilator). Drinking alcohol for a long time can have long-term effects on your brain, heart, and digestive system. These effects can be serious and may also require treatment. Follow these instructions at home:  Eating and drinking  Do not drink alcohol if: Your health care provider tells you not to drink. You are pregnant, may be pregnant, or are planning to become pregnant. You are under the legal drinking age, or under 26 years old in the [place] You are taking medicines that should not be taken with alcohol. You have a medical condition, and alcohol  makes it worse. You need to drive or perform activities that require you to be alert. You have substance use disorder. Ask your health care provider if alcohol is safe for you. If your health care provider allows you to drink alcohol, limit how much you have to: 0-1 drink a day for women who are not pregnant. 0-2 drinks a day for men. Know how much alcohol is in your drink. In the [place] one drink equals one 12 oz bottle of beer (355 mL), one 5 oz glass of wine (148 mL), or one 1 oz glass of hard liquor (44 mL). Avoid drinking alcohol on an empty stomach. Alcohol increases urination. It is important to stay hydrated and avoid caffeine. Avoid drinking more than one drink per hour. When having multiple drinks, drink water or a non-alcoholic beverage between alcoholic drinks. General instructions Take over-the-counter and prescription medicines only as told by your health care provider. Do not drive after drinking any amount of alcohol. Plan for a designated driver or another way to go home. Have someone responsible stay with you while you are intoxicated. You should notbe left alone. Contact a health care provider if: You do not feel better after a few days. You have problems at work, at school, or at home due to drinking. Get  help right away if: You have any of the following: Trouble staying awake. Moderate to severe trouble with coordination, speech, memory, or attention. You are told you may have had a seizure. Vomiting bright red blood or material that looks like coffee grounds. Bloody stool (feces). The blood may make your stool bright red, black, or tarry. These symptoms may be an emergency. Get help right away. Call 911. Do not wait to see if the symptoms will go away. Do not drive yourself to the hospital. Also, get help right away if: You have thoughts about hurting yourself or others. Take one of these steps if you feel like you may hurt yourself or others, or have thoughts  about taking your own life: Call 911. Call the National Suicide Prevention Lifeline at 406-593-2630 or 988. This is open 24 hours a day. Text the Crisis Text Line at 912-530-9748. Summary Alcohol intoxication occurs when a person no longer thinks clearly or functions well after drinking alcohol. Ask your health care provider if alcohol is safe for you. If your health care provider allows you to drink alcohol, limit how much you have. Contact your health care provider if drinking has caused you problems at work, school, or home. Get help right away if you have thoughts about hurting yourself or others. This information is not intended to replace advice given to you by your health care provider. Make sure you discuss any questions you have with your health care provider. Document Revised: 04/22/2021 Document Reviewed: 04/22/2021 Elsevier Patient Education  2024 Elsevier Inc. Diabetes Mellitus and Nutrition, Adult When you have diabetes, or diabetes mellitus, it is very important to have healthy eating habits because your blood sugar (glucose) levels are greatly affected by what you eat and drink. Eating healthy foods in the right amounts, at about the same times every day, can help you: Manage your blood glucose. Lower your risk of heart disease. Improve your blood pressure. Reach or maintain a healthy weight. What can affect my meal plan? Every person with diabetes is different, and each person has different needs for a meal plan. Your health care provider may recommend that you work with a dietitian to make a meal plan that is best for you. Your meal plan may vary depending on factors such as: The calories you need. The medicines you take. Your weight. Your blood glucose, blood pressure, and cholesterol levels. Your activity level. Other health conditions you have, such as heart or kidney disease. How do carbohydrates affect me? Carbohydrates, also called carbs, affect your blood glucose  level more than any other type of food. Eating carbs raises the amount of glucose in your blood. It is important to know how many carbs you can safely have in each meal. This is different for every person. Your dietitian can help you calculate how many carbs you should have at each meal and for each snack. How does alcohol affect me? Alcohol can cause a decrease in blood glucose (hypoglycemia), especially if you use insulin or take certain diabetes medicines by mouth. Hypoglycemia can be a life-threatening condition. Symptoms of hypoglycemia, such as sleepiness, dizziness, and confusion, are similar to symptoms of having too much alcohol. Do not drink alcohol if: Your health care provider tells you not to drink. You are pregnant, may be pregnant, or are planning to become pregnant. If you drink alcohol: Limit how much you have to: 0-1 drink a day for women. 0-2 drinks a day for men. Know how much alcohol is in  your drink. In the [place] one drink equals one 12 oz bottle of beer (355 mL), one 5 oz glass of wine (148 mL), or one 1 oz glass of hard liquor (44 mL). Keep yourself hydrated with water, diet soda, or unsweetened iced tea. Keep in mind that regular soda, juice, and other mixers may contain a lot of sugar and must be counted as carbs. What are tips for following this plan?  Reading food labels Start by checking the serving size on the Nutrition Facts label of packaged foods and drinks. The number of calories and the amount of carbs, fats, and other nutrients listed on the label are based on one serving of the item. Many items contain more than one serving per package. Check the total grams (g) of carbs in one serving. Check the number of grams of saturated fats and trans fats in one serving. Choose foods that have a low amount or none of these fats. Check the number of milligrams (mg) of salt (sodium) in one serving. Most people should limit total sodium intake to less than 2,300 mg per  day. Always check the nutrition information of foods labeled as "low-fat" or "nonfat." These foods may be higher in added sugar or refined carbs and should be avoided. Talk to your dietitian to identify your daily goals for nutrients listed on the label. Shopping Avoid buying canned, pre-made, or processed foods. These foods tend to be high in fat, sodium, and added sugar. Shop around the outside edge of the grocery store. This is where you will most often find fresh fruits and vegetables, bulk grains, fresh meats, and fresh dairy products. Cooking Use low-heat cooking methods, such as baking, instead of high-heat cooking methods, such as deep frying. Cook using healthy oils, such as olive, canola, or sunflower oil. Avoid cooking with butter, cream, or high-fat meats. Meal planning Eat meals and snacks regularly, preferably at the same times every day. Avoid going long periods of time without eating. Eat foods that are high in fiber, such as fresh fruits, vegetables, beans, and whole grains. Eat 4-6 oz (112-168 g) of lean protein each day, such as lean meat, chicken, fish, eggs, or tofu. One ounce (oz) (28 g) of lean protein is equal to: 1 oz (28 g) of meat, chicken, or fish. 1 egg.  cup (62 g) of tofu. Eat some foods each day that contain healthy fats, such as avocado, nuts, seeds, and fish. What foods should I eat? Fruits Berries. Apples. Oranges. Peaches. Apricots. Plums. Grapes. Mangoes. Papayas. Pomegranates. Kiwi. Cherries. Vegetables Leafy greens, including lettuce, spinach, kale, chard, collard greens, mustard greens, and cabbage. Beets. Cauliflower. Broccoli. Carrots. Green beans. Tomatoes. Peppers. Onions. Cucumbers. Brussels sprouts. Grains Whole grains, such as whole-wheat or whole-grain bread, crackers, tortillas, cereal, and pasta. Unsweetened oatmeal. Quinoa. Brown or wild rice. Meats and other proteins Seafood. Poultry without skin. Lean cuts of poultry and beef. Tofu.  Nuts. Seeds. Dairy Low-fat or fat-free dairy products such as milk, yogurt, and cheese. The items listed above may not be a complete list of foods and beverages you can eat and drink. Contact a dietitian for more information. What foods should I avoid? Fruits Fruits canned with syrup. Vegetables Canned vegetables. Frozen vegetables with butter or cream sauce. Grains Refined white flour and flour products such as bread, pasta, snack foods, and cereals. Avoid all processed foods. Meats and other proteins Fatty cuts of meat. Poultry with skin. Breaded or fried meats. Processed meat. Avoid saturated fats. Dairy [employer]  yogurt, cheese, or milk. Beverages Sweetened drinks, such as soda or iced tea. The items listed above may not be a complete list of foods and beverages you should avoid. Contact a dietitian for more information. Questions to ask a health care provider Do I need to meet with a certified diabetes care and education specialist? Do I need to meet with a dietitian? What number can I call if I have questions? When are the best times to check my blood glucose? Where to find more information: American Diabetes Association: diabetes.org Academy of Nutrition and Dietetics: eatright.[employer] of Diabetes and Digestive and Kidney Diseases: StageSync.si Association of Diabetes Care & Education Specialists: diabeteseducator.org Summary It is important to have healthy eating habits because your blood sugar (glucose) levels are greatly affected by what you eat and drink. It is important to use alcohol carefully. A healthy meal plan will help you manage your blood glucose and lower your risk of heart disease. Your health care provider may recommend that you work with a dietitian to make a meal plan that is best for you. This information is not intended to replace advice given to you by your health care provider. Make sure you discuss any questions you have with your health  care provider. Document Revised: 09/07/2019 Document Reviewed: 09/07/2019 Elsevier Patient Education  2024 Elsevier Inc. DASH Eating Plan DASH stands for Dietary Approaches to Stop Hypertension. The DASH eating plan is a healthy eating plan that has been shown to: Lower high blood pressure (hypertension). Reduce your risk for type 2 diabetes, heart disease, and stroke. Help with weight loss. What are tips for following this plan? Reading food labels Check food labels for the amount of salt (sodium) per serving. Choose foods with less than 5 percent of the Daily Value (DV) of sodium. In general, foods with less than 300 milligrams (mg) of sodium per serving fit into this eating plan. To find whole grains, look for the word "whole" as the first word in the ingredient list. Shopping Buy products labeled as "low-sodium" or "no salt added." Buy fresh foods. Avoid canned foods and pre-made or frozen meals. Cooking Try not to add salt when you cook. Use salt-free seasonings or herbs instead of table salt or sea salt. Check with your health care provider or pharmacist before using salt substitutes. Do not fry foods. Cook foods in healthy ways, such as baking, boiling, grilling, roasting, or broiling. Cook using oils that are good for your heart. These include olive, canola, avocado, soybean, and sunflower oil. Meal planning  Eat a balanced diet. This should include: 4 or more servings of fruits and 4 or more servings of vegetables each day. Try to fill half of your plate with fruits and vegetables. 6-8 servings of whole grains each day. 6 or less servings of lean meat, poultry, or fish each day. 1 oz is 1 serving. A 3 oz (85 g) serving of meat is about the same size as the palm of your hand. One egg is 1 oz (28 g). 2-3 servings of low-fat dairy each day. One serving is 1 cup (237 mL). 1 serving of nuts, seeds, or beans 5 times each week. 2-3 servings of heart-healthy fats. Healthy fats called  omega-3 fatty acids are found in foods such as walnuts, flaxseeds, fortified milks, and eggs. These fats are also found in cold-water fish, such as sardines, salmon, and mackerel. Limit how much you eat of: Canned or prepackaged foods. Food that is high in trans fat,  such as fried foods. Food that is high in saturated fat, such as fatty meat. Desserts and other sweets, sugary drinks, and other foods with added sugar. Full-fat dairy products. Do not salt foods before eating. Do not eat more than 4 egg yolks a week. Try to eat at least 2 vegetarian meals a week. Eat more home-cooked food and less restaurant, buffet, and fast food. Lifestyle When eating at a restaurant, ask if your food can be made with less salt or no salt. If you drink alcohol: Limit how much you have to: 0-1 drink a day if you are male. 0-2 drinks a day if you are male. Know how much alcohol is in your drink. In the [place] one drink is one 12 oz bottle of beer (355 mL), one 5 oz glass of wine (148 mL), or one 1 oz glass of hard liquor (44 mL). General information Avoid eating more than 2,300 mg of salt a day. If you have hypertension, you may need to reduce your sodium intake to 1,500 mg a day. Work with your provider to stay at a healthy body weight or lose weight. Ask what the best weight range is for you. On most days of the week, get at least 30 minutes of exercise that causes your heart to beat faster. This may include walking, swimming, or biking. Work with your provider or dietitian to adjust your eating plan to meet your specific calorie needs. What foods should I eat? Fruits All fresh, dried, or frozen fruit. Canned fruits that are in their natural juice and do not have sugar added to them. Vegetables Fresh or frozen vegetables that are raw, steamed, roasted, or grilled. Low-sodium or reduced-sodium tomato and vegetable juice. Low-sodium or reduced-sodium tomato sauce and tomato paste. Low-sodium or  reduced-sodium canned vegetables. Grains Whole-grain or whole-wheat bread. Whole-grain or whole-wheat pasta. Brown rice. Orpah Cobb. Bulgur. Whole-grain and low-sodium cereals. Pita bread. Low-fat, low-sodium crackers. Whole-wheat flour tortillas. Meats and other proteins Skinless chicken or [place]. Ground chicken or [place]. Pork with fat trimmed off. Fish and seafood. Egg whites. Dried beans, peas, or lentils. Unsalted nuts, nut butters, and seeds. Unsalted canned beans. Lean cuts of beef with fat trimmed off. Low-sodium, lean precooked or cured meat, such as sausages or meat loaves. Dairy Low-fat (1%) or fat-free (skim) milk. Reduced-fat, low-fat, or fat-free cheeses. Nonfat, low-sodium ricotta or cottage cheese. Low-fat or nonfat yogurt. Low-fat, low-sodium cheese. Fats and oils Soft margarine without trans fats. Vegetable oil. Reduced-fat, low-fat, or light mayonnaise and salad dressings (reduced-sodium). Canola, safflower, olive, avocado, soybean, and sunflower oils. Avocado. Seasonings and condiments Herbs. Spices. Seasoning mixes without salt. Other foods Unsalted popcorn and pretzels. Fat-free sweets. The items listed above may not be all the foods and drinks you can have. Talk to a dietitian to learn more. What foods should I avoid? Fruits Canned fruit in a light or heavy syrup. Fried fruit. Fruit in cream or butter sauce. Vegetables Creamed or fried vegetables. Vegetables in a cheese sauce. Regular canned vegetables that are not marked as low-sodium or reduced-sodium. Regular canned tomato sauce and paste that are not marked as low-sodium or reduced-sodium. Regular tomato and vegetable juices that are not marked as low-sodium or reduced-sodium. Rosita Fire. Olives. Grains Baked goods made with fat, such as croissants, muffins, or some breads. Dry pasta or rice meal packs. Meats and other proteins Fatty cuts of meat. Ribs. Fried meat. Tomasa Blase. Bologna, salami, and other precooked or  cured meats, such as sausages or meat  loaves, that are not lean and low in sodium. Fat from the back of a pig (fatback). Bratwurst. Salted nuts and seeds. Canned beans with added salt. Canned or smoked fish. Whole eggs or egg yolks. Chicken or [place] with skin. Dairy Whole or 2% milk, cream, and half-and-half. Whole or full-fat cream cheese. Whole-fat or sweetened yogurt. Full-fat cheese. Nondairy creamers. Whipped toppings. Processed cheese and cheese spreads. Fats and oils Butter. Stick margarine. Lard. Shortening. Ghee. Bacon fat. Tropical oils, such as coconut, palm kernel, or palm oil. Seasonings and condiments Onion salt, garlic salt, seasoned salt, table salt, and sea salt. Worcestershire sauce. Tartar sauce. Barbecue sauce. Teriyaki sauce. Soy sauce, including reduced-sodium soy sauce. Steak sauce. Canned and packaged gravies. Fish sauce. Oyster sauce. Cocktail sauce. Store-bought horseradish. Ketchup. Mustard. Meat flavorings and tenderizers. Bouillon cubes. Hot sauces. Pre-made or packaged marinades. Pre-made or packaged taco seasonings. Relishes. Regular salad dressings. Other foods Salted popcorn and pretzels. The items listed above may not be all the foods and drinks you should avoid. Talk to a dietitian to learn more. Where to find more information National Heart, Lung, and Blood Institute (NHLBI): BuffaloDryCleaner.gl American Heart Association (AHA): heart.org Academy of Nutrition and Dietetics: eatright.org National Kidney Foundation (NKF): kidney.org This information is not intended to replace advice given to you by your health care provider. Make sure you discuss any questions you have with your health care provider. Document Revised: 02/20/2022 Document Reviewed: 02/20/2022 Elsevier Patient Education  2024 [employer].

## 2022-10-09 ENCOUNTER — Other Ambulatory Visit: Payer: Self-pay

## 2022-10-09 DIAGNOSIS — Z0189 Encounter for other specified special examinations: Secondary | ICD-10-CM

## 2022-10-09 LAB — GLUCOSE, POCT (MANUAL RESULT ENTRY): POC Glucose: 311 mg/dl — AB (ref 70–99)

## 2022-10-09 NOTE — Congregational Nurse Program (Signed)
  Dept: [phone]   Congregational Nurse Program Note  Date of Encounter: 10/09/2022 Client to [facility] with request for assistance in learning how to check his blood sugar. He was seen on 8/21 at the [facility] and given a glucometer and supplies. He was instructed to check his blood sugar in the morning and evening. RN demonstrated how to use the type of lancet he was given. Client was very overwhelmed and concerned about performing this. Must support given. Client instructed to return to the center on Monday for further support. Client voiced understanding. RN will also assist with finding a PCP for client as he now has BC/BS through the exchange.Francesco Runner BSN, RN Past Medical History: Past Medical History:  Diagnosis Date   Alcohol abuse     Encounter Details:  CNP Questionnaire - 10/09/22 0845       Questionnaire   Ask client: Do you give verbal consent for me to treat you today? 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[place] [employer]   BS/BC through the exchange   Insurance/Financial Assistance Referral N/A   RN to assist with Medicaid application   Medication N/A    Medical Provider Yes   [facility]   Screening Referrals Made N/A    Medical Referrals Made N/A   client will now need a new PCP as he has insurance   Medical Appointment Made N/A   [facility] made for 8/21 at 10:30 am   Recently w/o PCP, now 1st time PCP visit completed due to CNs referral or appointment made N/A    Food N/A    Transportation Need transportation assistance   [place] bus passes given for patient to pick up medications at [facility]   Housing/Utilities No permanent housing    Interpersonal Safety N/A    Interventions Advocate/Support;Educate;Reviewed Medications    Abnormal to Normal Screening Since Last CN Visit N/A    Screenings CN  Performed Blood Glucose   nofasting glucose 311   Sent Client to Lab for: N/A    Did client attend any of the following based off CNs referral or appointments made? Medical    ED Visit Averted N/A    Life-Saving Intervention Made N/A

## 2022-10-24 ENCOUNTER — Other Ambulatory Visit: Payer: Self-pay

## 2022-10-28 ENCOUNTER — Emergency Department: Payer: BLUE CROSS/BLUE SHIELD

## 2022-10-28 ENCOUNTER — Other Ambulatory Visit: Payer: Self-pay

## 2022-10-28 ENCOUNTER — Emergency Department
Admission: EM | Admit: 2022-10-28 | Discharge: 2022-10-28 | Disposition: A | Discharge status: Home / Self Care | Payer: BLUE CROSS/BLUE SHIELD | Attending: Emergency Medicine | Admitting: Emergency Medicine

## 2022-10-28 DIAGNOSIS — Z23 Encounter for immunization: Secondary | ICD-10-CM | POA: Insufficient documentation

## 2022-10-28 DIAGNOSIS — S0101XA Laceration without foreign body of scalp, initial encounter: Secondary | ICD-10-CM | POA: Diagnosis present

## 2022-10-28 DIAGNOSIS — S02401B Maxillary fracture, unspecified, initial encounter for open fracture: Secondary | ICD-10-CM | POA: Diagnosis not present

## 2022-10-28 DIAGNOSIS — S0181XA Laceration without foreign body of other part of head, initial encounter: Secondary | ICD-10-CM | POA: Insufficient documentation

## 2022-10-28 DIAGNOSIS — F10929 Alcohol use, unspecified with intoxication, unspecified: Secondary | ICD-10-CM | POA: Diagnosis not present

## 2022-10-28 HISTORY — DX: Type 2 diabetes mellitus without complications: E11.9

## 2022-10-28 MED ORDER — AMOXICILLIN-POT CLAVULANATE 875-125 MG PO TABS
1.0000 | ORAL_TABLET | Freq: Once | ORAL | Status: AC
  Administered 2022-10-28: 1 via ORAL
  Filled 2022-10-28: qty 1

## 2022-10-28 MED ORDER — LIDOCAINE-EPINEPHRINE 2 %-1:100000 IJ SOLN
20.0000 mL | Freq: Once | INTRAMUSCULAR | Status: AC
  Administered 2022-10-28: 20 mL
  Filled 2022-10-28: qty 1

## 2022-10-28 MED ORDER — AMOXICILLIN-POT CLAVULANATE 875-125 MG PO TABS
1.0000 | ORAL_TABLET | Freq: Two times a day (BID) | ORAL | 0 refills | Status: AC
  Filled 2022-10-28: qty 10, 5d supply, fill #0

## 2022-10-28 MED ORDER — TETANUS-DIPHTH-ACELL PERTUSSIS 5-2.5-18.5 LF-MCG/0.5 IM SUSY
0.5000 mL | PREFILLED_SYRINGE | Freq: Once | INTRAMUSCULAR | Status: AC
  Administered 2022-10-28: 0.5 mL via INTRAMUSCULAR
  Filled 2022-10-28: qty 0.5

## 2022-10-28 MED ORDER — KETOROLAC TROMETHAMINE 30 MG/ML IJ SOLN
30.0000 mg | Freq: Once | INTRAMUSCULAR | Status: AC
  Administered 2022-10-28: 30 mg via INTRAMUSCULAR
  Filled 2022-10-28: qty 1

## 2022-10-28 NOTE — ED Notes (Signed)
Pt asleep at this time, NAD. Callbell within reach.
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
## 2022-10-28 NOTE — ED Notes (Signed)
ED Provider at bedside. 
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
## 2022-10-28 NOTE — Discharge Instructions (Addendum)
You have 5 staples in your scalp that will need to be removed after 7-10 days.  This can be done in the clinic, urgent care or emergency room  5 stitches on the forehead will absorb on their own  7 stitches on your right cheek will absorb on their own  Your right cheek bone is broken and because of the cut over it you are being discharged with Augmentin antibiotics.  Twice daily for 5 days to prevent infection

## 2022-10-28 NOTE — ED Notes (Signed)
Pt again yelling and trying to get out of bed. RN was able to deescalate the situation and get pt back in room. MD notified.

## 2022-10-28 NOTE — ED Notes (Signed)
Pt refused this RN to obtain VS at this time. No concerns at this time.
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
## 2022-10-28 NOTE — ED Notes (Signed)
Pt was yelling at registration and then RN. RN attempted to deescalate situation. Pt continued to yell and use profanity. Once pt stopped RN reeducated on npo status and reasoning behind it; this has already been done on numerous occasion.

## 2022-10-29 ENCOUNTER — Ambulatory Visit: Payer: Self-pay

## 2022-10-29 ENCOUNTER — Other Ambulatory Visit: Payer: Self-pay
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
## 2022-10-29 NOTE — Telephone Encounter (Signed)
Note created in ERROR.

## 2022-10-30 DIAGNOSIS — Z0189 Encounter for other specified special examinations: Secondary | ICD-10-CM

## 2022-10-30 LAB — GLUCOSE, POCT (MANUAL RESULT ENTRY): POC Glucose: 309 mg/dL — AB (ref 70–99)
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
## 2022-11-05 ENCOUNTER — Ambulatory Visit: Payer: Self-pay | Admitting: Gerontology

## 2022-11-06 DIAGNOSIS — Z0189 Encounter for other specified special examinations: Secondary | ICD-10-CM

## 2022-11-06 LAB — GLUCOSE, POCT (MANUAL RESULT ENTRY): POC Glucose: 347 mg/dl — AB (ref 70–99)
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
## 2023-03-18 ENCOUNTER — Encounter: Payer: Self-pay | Admitting: *Deleted

## 2023-03-18 ENCOUNTER — Other Ambulatory Visit: Payer: Self-pay

## 2023-03-18 ENCOUNTER — Emergency Department
Admission: EM | Admit: 2023-03-18 | Discharge: 2023-03-18 | Disposition: A | Discharge status: Home / Self Care | Payer: BLUE CROSS/BLUE SHIELD | Attending: Student in an Organized Health Care Education/Training Program | Admitting: Student in an Organized Health Care Education/Training Program

## 2023-03-18 DIAGNOSIS — Z1152 Encounter for screening for COVID-19: Secondary | ICD-10-CM | POA: Diagnosis not present

## 2023-03-18 DIAGNOSIS — R739 Hyperglycemia, unspecified: Secondary | ICD-10-CM

## 2023-03-18 DIAGNOSIS — E1165 Type 2 diabetes mellitus with hyperglycemia: Secondary | ICD-10-CM | POA: Diagnosis not present

## 2023-03-18 DIAGNOSIS — E119 Type 2 diabetes mellitus without complications: Secondary | ICD-10-CM

## 2023-03-18 DIAGNOSIS — R200 Anesthesia of skin: Secondary | ICD-10-CM | POA: Insufficient documentation

## 2023-03-18 LAB — RESP PANEL BY RT-PCR (RSV, FLU A&B, COVID)  RVPGX2
Influenza A by PCR: NEGATIVE
Influenza B by PCR: NEGATIVE
Resp Syncytial Virus by PCR: NEGATIVE
SARS Coronavirus 2 by RT PCR: NEGATIVE

## 2023-03-18 LAB — BASIC METABOLIC PANEL
Anion gap: 14 (ref 5–15)
BUN: 7 mg/dL — ABNORMAL LOW (ref 8–23)
CO2: 23 mmol/L (ref 22–32)
Calcium: 9 mg/dL (ref 8.9–10.3)
Chloride: 99 mmol/L (ref 98–111)
Creatinine, Ser: 0.76 mg/dL (ref 0.61–1.24)
GFR, Estimated: >60 mL/min (ref >60)
Glucose, Bld: 234 mg/dL — ABNORMAL HIGH (ref 70–99)
Potassium: 3.5 mmol/L (ref 3.5–5.1)
Sodium: 136 mmol/L (ref 135–145)

## 2023-03-18 LAB — CBC
HCT: 35.6 % — ABNORMAL LOW (ref 39.0–52.0)
Hemoglobin: 11.9 g/dL — ABNORMAL LOW (ref 13.0–17.0)
MCH: 30 pg (ref 26.0–34.0)
MCHC: 33.4 g/dL (ref 30.0–36.0)
MCV: 89.7 fL (ref 80.0–100.0)
Platelets: 259 10*3/uL (ref 150–400)
RBC: 3.97 MIL/uL — ABNORMAL LOW (ref 4.22–5.81)
RDW: 12 % (ref 11.5–15.5)
WBC: 8.8 10*3/uL (ref 4.0–10.5)
nRBC: 0 % (ref 0.0–0.2)

## 2023-03-18 LAB — CBG MONITORING, ED: Glucose-Capillary: 247 mg/dL — ABNORMAL HIGH (ref 70–99)

## 2023-03-18 MED ORDER — METFORMIN HCL 500 MG PO TABS: 500.0000 mg | ORAL_TABLET | Freq: Two times a day (BID) | ORAL | 0 refills | Status: DC
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
## 2023-03-18 NOTE — ED Provider Notes (Signed)
[facility] Provider Note    Event Date/Time   First MD Initiated Contact with Patient 03/18/23 2220     (approximate)   History   Hyperglycemia   HPI  Richard Estes is a 63 y.o. male with a history of diabetes intermittently compliant with his metformin presents to the ER for evaluation of polyuria polydipsia needing refill on his metformin.  This is also has had some numbness to his left arm but has been present and unchanged for over 1 year.  Denies any headaches.  No blurry vision.  Does admit to drinking alcohol.  Denies any chest pain no shortness of breath.     Physical Exam   Triage Vital Signs: ED Triage Vitals [03/18/23 1911]  Encounter Vitals Group     BP (!) 137/102     Systolic BP Percentile      Diastolic BP Percentile      Pulse Rate (!) 104     Resp 16     Temp 98.1 F (36.7 C)     Temp Source Oral     SpO2 100 %     Weight      Height      Head Circumference      Peak Flow      Pain Score 0     Pain Loc      Pain Education      Exclude from Growth Chart     Most recent vital signs: Vitals:   03/18/23 1911  BP: (!) 137/102  Pulse: (!) 104  Resp: 16  Temp: 98.1 F (36.7 C)  SpO2: 100%     Constitutional: Alert  Eyes: Conjunctivae are normal.  Head: Atraumatic. Nose: No congestion/rhinnorhea. Mouth/Throat: Mucous membranes are moist.   Neck: Painless ROM.  Cardiovascular:   Good peripheral circulation. Respiratory: Normal respiratory effort.  No retractions.  Gastrointestinal: Soft and nontender.  Musculoskeletal:  no deformity Neurologic:  CN- intact.  No facial droop, Normal FNF.  Normal heel to shin.  Sensation intact bilaterally. Normal speech and language. No gross focal neurologic deficits are appreciated. No gait instability.  Skin:  Skin is warm, dry and intact. No rash noted. Psychiatric: Mood and affect are normal. Speech and behavior are normal.    ED Results / Procedures / Treatments    Labs (all labs ordered are listed, but only abnormal results are displayed) Labs Reviewed  BASIC METABOLIC PANEL - Abnormal; Notable for the following components:      Result Value   Glucose, Bld 234 (*)    BUN 7 (*)    All other components within normal limits  CBC - Abnormal; Notable for the following components:   RBC 3.97 (*)    Hemoglobin 11.9 (*)    HCT 35.6 (*)    All other components within normal limits  CBG MONITORING, ED - Abnormal; Notable for the following components:   Glucose-Capillary 247 (*)    All other components within normal limits  RESP PANEL BY RT-PCR (RSV, FLU A&B, COVID)  [id number]  URINALYSIS, ROUTINE W REFLEX MICROSCOPIC  CBG MONITORING, ED        PROCEDURES:  Critical Care performed:   Procedures   MEDICATIONS ORDERED IN ED: Medications - No data to display   IMPRESSION / MDM / ASSESSMENT AND PLAN / ED COURSE  I reviewed the triage vital signs and the nursing notes.  Differential diagnosis includes, but is not limited to, hyperglycemia, DKA, HHS, electrolyte abnormality, CVA, radiculopathy  Patient presenting to the ER for evaluation of symptoms as described above.  Based on symptoms, risk factors and considered above differential, this presenting complaint could reflect a potentially life-threatening illness therefore the patient will be placed on continuous pulse oximetry and telemetry for monitoring.  Laboratory evaluation will be sent to evaluate for the above complaints.  Patient's exam is otherwise reassuring.  He is well-perfused.  He is tolerating p.o.  No sign of DKA or HHS.  Glucose is mildly elevated.  Will refill metformin.  Do not feel further diagnostic testing clinically indicated based on his presentation.  Patient is agreeable plan.       FINAL CLINICAL IMPRESSION(S) / ED DIAGNOSES   Final diagnoses:  Hyperglycemia     Rx / DC Orders   ED Discharge Orders          Ordered     metFORMIN (GLUCOPHAGE) 500 MG tablet  2 times daily with meals        03/18/23 2224             Note:  This document was prepared using Dragon voice recognition software and may include unintentional dictation errors.    Willy Eddy, MD 03/18/23 2225

## 2023-03-18 NOTE — ED Provider Triage Note (Signed)
Emergency Medicine Provider Triage Evaluation Note  Richard Estes , a 63 y.o. male  was evaluated in triage.  Pt complains of hyperglycemia.  Patient refers feeling thirsty, hungry, increase nocturia.  Unable to check blood glucose at home  Review of Systems  Positive:  Negative:   Physical Exam  There were no vitals taken for this visit. Gen:   Awake, no distress   Resp:  Normal effort  MSK:   Moves extremities without difficulty  Other:    Medical Decision Making  Medically screening exam initiated at 7:10 PM.  Appropriate orders placed.  Ezriel D Netzel was informed that the remainder of the evaluation will be completed by another provider, this initial triage assessment does not replace that evaluation, and the importance of remaining in the ED until their evaluation is complete.  Check glucose in triage 247.    Gladys Damme, PA-C 03/18/23 1911

## 2023-03-18 NOTE — ED Notes (Signed)
Urine cup given, advised that urine sample is needed

## 2023-03-18 NOTE — ED Notes (Addendum)
Pt has no ride back to [facility] and no contacts to call. Pt was given a bus pass for the morning and discharged to the lobby to wait. Pt was fine with this plan.  Pt also given a sandwich bag and beverage.

## 2023-03-18 NOTE — ED Triage Notes (Signed)
Pt is here for evaluation of hyperglycemia.  Pt states that he has been out of metformin. Has not been checking his CBG at home either.

## 2023-06-10 ENCOUNTER — Emergency Department: Payer: MEDICAID

## 2023-06-10 ENCOUNTER — Inpatient Hospital Stay
Admission: EM | Admit: 2023-06-10 | Discharge: 2023-06-13 | DRG: 394 | Disposition: A | Discharge status: Home / Self Care | Payer: MEDICAID | Attending: Internal Medicine | Admitting: Internal Medicine

## 2023-06-10 ENCOUNTER — Other Ambulatory Visit: Payer: Self-pay

## 2023-06-10 ENCOUNTER — Encounter: Payer: Self-pay | Admitting: Emergency Medicine

## 2023-06-10 DIAGNOSIS — F1020 Alcohol dependence, uncomplicated: Secondary | ICD-10-CM | POA: Diagnosis present

## 2023-06-10 DIAGNOSIS — Z635 Disruption of family by separation and divorce: Secondary | ICD-10-CM

## 2023-06-10 DIAGNOSIS — Z7984 Long term (current) use of oral hypoglycemic drugs: Secondary | ICD-10-CM

## 2023-06-10 DIAGNOSIS — K648 Other hemorrhoids: Principal | ICD-10-CM | POA: Diagnosis present

## 2023-06-10 DIAGNOSIS — Z5986 Financial insecurity: Secondary | ICD-10-CM

## 2023-06-10 DIAGNOSIS — Z91148 Patient's other noncompliance with medication regimen for other reason: Secondary | ICD-10-CM

## 2023-06-10 DIAGNOSIS — Z5900 Homelessness unspecified: Secondary | ICD-10-CM | POA: Insufficient documentation

## 2023-06-10 DIAGNOSIS — D62 Acute posthemorrhagic anemia: Secondary | ICD-10-CM | POA: Diagnosis present

## 2023-06-10 DIAGNOSIS — I723 Aneurysm of iliac artery: Secondary | ICD-10-CM | POA: Diagnosis present

## 2023-06-10 DIAGNOSIS — Z0189 Encounter for other specified special examinations: Secondary | ICD-10-CM

## 2023-06-10 DIAGNOSIS — E119 Type 2 diabetes mellitus without complications: Secondary | ICD-10-CM

## 2023-06-10 DIAGNOSIS — Z5948 Other specified lack of adequate food: Secondary | ICD-10-CM

## 2023-06-10 DIAGNOSIS — E876 Hypokalemia: Secondary | ICD-10-CM | POA: Diagnosis present

## 2023-06-10 DIAGNOSIS — R739 Hyperglycemia, unspecified: Secondary | ICD-10-CM

## 2023-06-10 DIAGNOSIS — F17210 Nicotine dependence, cigarettes, uncomplicated: Secondary | ICD-10-CM | POA: Diagnosis present

## 2023-06-10 DIAGNOSIS — K625 Hemorrhage of anus and rectum: Secondary | ICD-10-CM | POA: Diagnosis present

## 2023-06-10 DIAGNOSIS — Z5941 Food insecurity: Secondary | ICD-10-CM

## 2023-06-10 DIAGNOSIS — E871 Hypo-osmolality and hyponatremia: Secondary | ICD-10-CM | POA: Diagnosis present

## 2023-06-10 DIAGNOSIS — K626 Ulcer of anus and rectum: Secondary | ICD-10-CM | POA: Diagnosis present

## 2023-06-10 DIAGNOSIS — T383X6A Underdosing of insulin and oral hypoglycemic [antidiabetic] drugs, initial encounter: Secondary | ICD-10-CM | POA: Diagnosis present

## 2023-06-10 DIAGNOSIS — Z91030 Bee allergy status: Secondary | ICD-10-CM

## 2023-06-10 DIAGNOSIS — K621 Rectal polyp: Secondary | ICD-10-CM | POA: Diagnosis present

## 2023-06-10 DIAGNOSIS — E1165 Type 2 diabetes mellitus with hyperglycemia: Secondary | ICD-10-CM | POA: Diagnosis present

## 2023-06-10 DIAGNOSIS — Z5902 Unsheltered homelessness: Secondary | ICD-10-CM

## 2023-06-10 LAB — CBC
HCT: 31.8 % — ABNORMAL LOW (ref 39.0–52.0)
Hemoglobin: 10.8 g/dL — ABNORMAL LOW (ref 13.0–17.0)
MCH: 30.9 pg (ref 26.0–34.0)
MCHC: 34 g/dL (ref 30.0–36.0)
MCV: 90.9 fL (ref 80.0–100.0)
Platelets: 249 10*3/uL (ref 150–400)
RBC: 3.5 MIL/uL — ABNORMAL LOW (ref 4.22–5.81)
RDW: 12.1 % (ref 11.5–15.5)
WBC: 6.8 10*3/uL (ref 4.0–10.5)
nRBC: 0 % (ref 0.0–0.2)

## 2023-06-10 LAB — URINALYSIS, ROUTINE W REFLEX MICROSCOPIC
Bilirubin Urine: NEGATIVE
Glucose, UA: >=500 mg/dL — AB
Hgb urine dipstick: NEGATIVE
Ketones, ur: NEGATIVE mg/dL
Leukocytes,Ua: NEGATIVE
Nitrite: NEGATIVE
Protein, ur: NEGATIVE mg/dL
RBC / HPF: 0 RBC/hpf (ref 0–5)
Specific Gravity, Urine: 1.029 (ref 1.005–1.030)
WBC, UA: 0 WBC/hpf (ref 0–5)
pH: 7 (ref 5.0–8.0)

## 2023-06-10 LAB — BASIC METABOLIC PANEL WITH GFR
Anion gap: 15 (ref 5–15)
BUN: 8 mg/dL (ref 8–23)
CO2: 24 mmol/L (ref 22–32)
Calcium: 8.4 mg/dL — ABNORMAL LOW (ref 8.9–10.3)
Chloride: 88 mmol/L — ABNORMAL LOW (ref 98–111)
Creatinine, Ser: 0.6 mg/dL — ABNORMAL LOW (ref 0.61–1.24)
GFR, Estimated: >60 mL/min (ref >60)
Glucose, Bld: 473 mg/dL — ABNORMAL HIGH (ref 70–99)
Potassium: 2.9 mmol/L — ABNORMAL LOW (ref 3.5–5.1)
Sodium: 127 mmol/L — ABNORMAL LOW (ref 135–145)

## 2023-06-10 LAB — GLUCOSE, POCT (MANUAL RESULT ENTRY): POC Glucose: 389 mg/dL — AB (ref 70–99)

## 2023-06-10 LAB — PROTIME-INR
INR: 1.2 (ref 0.8–1.2)
Prothrombin Time: 15.3 s — ABNORMAL HIGH (ref 11.4–15.2)

## 2023-06-10 LAB — CBG MONITORING, ED
Glucose-Capillary: 491 mg/dL — ABNORMAL HIGH (ref 70–99)
Glucose-Capillary: 344 mg/dL — ABNORMAL HIGH (ref 70–99)

## 2023-06-10 LAB — APTT: aPTT: 25 s (ref 24–36)

## 2023-06-10 MED ORDER — POTASSIUM CHLORIDE CRYS ER 20 MEQ PO TBCR
40.0000 meq | EXTENDED_RELEASE_TABLET | Freq: Once | ORAL | Status: AC
  Administered 2023-06-10: 40 meq via ORAL
  Filled 2023-06-10: qty 2

## 2023-06-10 MED ORDER — ONDANSETRON HCL 4 MG/2ML IJ SOLN
4.0000 mg | Freq: Once | INTRAMUSCULAR | Status: AC
  Administered 2023-06-10: 4 mg via INTRAVENOUS
  Filled 2023-06-10: qty 2

## 2023-06-10 MED ORDER — SODIUM CHLORIDE 0.9 % IV BOLUS
1000.0000 mL | Freq: Once | INTRAVENOUS | Status: AC
  Administered 2023-06-10: 1000 mL via INTRAVENOUS

## 2023-06-10 MED ORDER — POTASSIUM CHLORIDE 10 MEQ/100ML IV SOLN
10.0000 meq | Freq: Once | INTRAVENOUS | Status: AC
  Administered 2023-06-10: 10 meq via INTRAVENOUS
  Filled 2023-06-10: qty 100

## 2023-06-10 MED ORDER — GELATIN ABSORBABLE 12-7 MM EX MISC
CUTANEOUS | Status: AC
  Filled 2023-06-10: qty 1

## 2023-06-10 MED ORDER — INSULIN ASPART 100 UNIT/ML IJ SOLN
10.0000 [IU] | Freq: Once | INTRAMUSCULAR | Status: AC
  Administered 2023-06-10: 10 [IU] via INTRAVENOUS
  Filled 2023-06-10: qty 1

## 2023-06-10 MED ORDER — IOHEXOL 350 MG/ML SOLN
100.0000 mL | Freq: Once | INTRAVENOUS | Status: AC | PRN
  Administered 2023-06-10: 100 mL via INTRAVENOUS

## 2023-06-10 MED ORDER — INSULIN ASPART 100 UNIT/ML IJ SOLN
5.0000 [IU] | Freq: Once | INTRAMUSCULAR | Status: AC
  Administered 2023-06-10: 5 [IU] via INTRAVENOUS
  Filled 2023-06-10: qty 1

## 2023-06-10 NOTE — ED Notes (Signed)
 Patient back to bedside from toilet, blood dripping from rectum. Dr Karlynn Oyster to bedside.

## 2023-06-10 NOTE — Congregational Nurse Program (Signed)
  Dept: [phone]   Congregational Nurse Program Note  Date of Encounter: 06/10/2023 Client to [facility], nurse led clinic with request for blood glucose check. Non-fasting blood glucose 389. Client reports he was been with out his metformin  for an unclear amount of time. He reported that he had been staying in [facility] and that his medications were filled previously at a [employer] there. He was unclear as to which one. RN offered to assist with having the medication transfer to the [employer] locally, client declined and said he would return later if he needed assistance. RN provided education regarding the need for blood glucose control. Client voiced understanding, and stated he was "watching his sweets".  RN will continue to attempt to assist client with better management of his blood glucose.Arleen Lacer Past Medical History: Past Medical History:  Diagnosis Date   Alcohol abuse    Diabetes mellitus without complication [facility]     Encounter Details:  Community Questionnaire - 06/10/23 0917       Questionnaire   Ask client: Do you give verbal consent for me to treat you today? Yes    Student Assistance N/A    Location Patient Served  [employer]    Encounter Setting CN site    Population Status Unhoused    Engineer, building services or [place] Insurance   BS/BC through the exchange   Insurance/Financial Assistance Referral N/A   RN to assist with Medicaid application.   Medication N/A    Medical Provider [facility]   [facility]   Screening Referrals Made N/A    Medical Referrals Made N/A   [facility]. call placed for apt   Medical Appointment Completed N/A    CNP Interventions Educate;Advocate/Support    Screenings CN Performed Blood Glucose    ED Visit Averted N/A    Life-Saving Intervention Made N/A

## 2023-06-10 NOTE — ED Triage Notes (Signed)
 Pt [facility] pt called for hyperglycemia after being out of Metformin  x2 weeks. EMS CBG 480. Pt sts he feels tired and weak. Pt A&O x4 on arrival to ED.

## 2023-06-10 NOTE — ED Notes (Signed)
 Surgeon at bedside.

## 2023-06-10 NOTE — ED Provider Notes (Incomplete)
 [facility] Provider Note    Event Date/Time   First MD Initiated Contact with Patient 06/10/23 2013     (approximate)   History   Hyperglycemia   HPI Richard Estes is a 63 y.o. male with history of DM2, alcohol abuse presenting today for hyperglycemia.  Patient states he has been out of his metformin  for the past 2 weeks.  Typically has high sugars when this happens in the past.  Never been on insulin .  Otherwise denies nausea, vomiting, abdominal pain, chest pain, shortness of breath, diarrhea, constipation.  Has been urinating frequently but no painful urination.  Notes 2 beers today.     Physical Exam   Triage Vital Signs: ED Triage Vitals [06/10/23 2014]  Encounter Vitals Group     BP      Systolic BP Percentile      Diastolic BP Percentile      Pulse      Resp      Temp      Temp src      SpO2      Weight 150 lb (68 kg)     Height 5\' 9"  (1.753 m)     Head Circumference      Peak Flow      Pain Score 4     Pain Loc      Pain Education      Exclude from Growth Chart     Most recent vital signs: Vitals:   06/10/23 2230 06/10/23 2310  BP: (!) 145/100 134/89  Pulse: 84 81  Resp: 15 14  Temp:    SpO2: 100% 100%    Physical Exam: I have reviewed the vital signs and nursing notes. General: Awake, alert, no acute distress.  Nontoxic appearing. Head:  Atraumatic, normocephalic.   ENT:  EOM intact, PERRL. Oral mucosa is pink and moist with no lesions. Neck: Neck is supple with full range of motion, No meningeal signs. Cardiovascular:  RRR, No murmurs. Peripheral pulses palpable and equal bilaterally. Respiratory:  Symmetrical chest wall expansion.  No rhonchi, rales, or wheezes.  Good air movement throughout.  No use of accessory muscles.   Musculoskeletal:  No cyanosis or edema. Moving extremities with full ROM Abdomen:  Soft, nontender, nondistended. Neuro:  GCS 15, moving all four extremities, interacting appropriately. Speech  clear. Psych:  Calm, appropriate.   Skin:  Warm, dry, no rash.    ED Results / Procedures / Treatments   Labs (all labs ordered are listed, but only abnormal results are displayed) Labs Reviewed  BASIC METABOLIC PANEL WITH GFR - Abnormal; Notable for the following components:      Result Value   Sodium 127 (*)    Potassium 2.9 (*)    Chloride 88 (*)    Glucose, Bld 473 (*)    Creatinine, Ser 0.60 (*)    Calcium 8.4 (*)    All other components within normal limits  CBC - Abnormal; Notable for the following components:   RBC 3.50 (*)    Hemoglobin 10.8 (*)    HCT 31.8 (*)    All other components within normal limits  URINALYSIS, ROUTINE W REFLEX MICROSCOPIC - Abnormal; Notable for the following components:   Color, Urine YELLOW (*)    APPearance CLEAR (*)    Glucose, UA >=500 (*)    Bacteria, UA RARE (*)    All other components within normal limits  PROTIME-INR - Abnormal; Notable for the following components:  Prothrombin Time 15.3 (*)    All other components within normal limits  CBG MONITORING, ED - Abnormal; Notable for the following components:   Glucose-Capillary 491 (*)    All other components within normal limits  CBG MONITORING, ED - Abnormal; Notable for the following components:   Glucose-Capillary 344 (*)    All other components within normal limits  APTT  CBC WITH DIFFERENTIAL/PLATELET  TYPE AND SCREEN     EKG My EKG interpretation: Rate of 81, normal sinus rhythm, normal axis, normal intervals.  No acute ST elevations or depressions   RADIOLOGY Independently interpreted CT abdomen/pelvis with evidence of active GI bleed from internal hemorrhoid.   PROCEDURES:  Critical Care performed: Yes, see critical care procedure note(s)  .Critical Care  Performed by: Kandee Orion, MD Authorized by: Kandee Orion, MD   Critical care provider statement:    Critical care time (minutes):  30   Critical care was necessary to treat or prevent imminent or  life-threatening deterioration of the following conditions: active GI bleed.   Critical care was time spent personally by me on the following activities:  Development of treatment plan with patient or surrogate, discussions with consultants, evaluation of patient's response to treatment, examination of patient, ordering and review of laboratory studies, ordering and review of radiographic studies, ordering and performing treatments and interventions, pulse oximetry, re-evaluation of patient's condition and review of old charts   I assumed direction of critical care for this patient from another provider in my specialty: no     Care discussed with: admitting provider      MEDICATIONS ORDERED IN ED: Medications  sodium chloride  0.9 % bolus 1,000 mL (0 mLs Intravenous Stopped 06/10/23 2117)  insulin  aspart (novoLOG ) injection 10 Units (10 Units Intravenous Given 06/10/23 2028)  potassium chloride  10 mEq in 100 mL IVPB (0 mEq Intravenous Stopped 06/10/23 2152)  potassium chloride  SA (KLOR-CON  M) CR tablet 40 mEq (40 mEq Oral Given 06/10/23 2046)  sodium chloride  0.9 % bolus 1,000 mL (0 mLs Intravenous Stopped 06/10/23 2210)  insulin  aspart (novoLOG ) injection 5 Units (5 Units Intravenous Given 06/10/23 2150)  ondansetron  (ZOFRAN ) injection 4 mg (4 mg Intravenous Given 06/10/23 2316)  iohexol  (OMNIPAQUE ) 350 MG/ML injection 100 mL (100 mLs Intravenous Contrast Given 06/10/23 2257)     IMPRESSION / MDM / ASSESSMENT AND PLAN / ED COURSE  I reviewed the triage vital signs and the nursing notes.                              Differential diagnosis includes, but is not limited to, hyperglycemia, DKA, alcohol intoxication, dehydration  Patient's presentation is most consistent with exacerbation of chronic illness.  Patient is a 63 year old male with history of diabetes presenting today for hyperglycemia.  Ran out of metformin  2 weeks ago which is likely cause of his hyperglycemia today.  Blood sugar  elevated at 480 with EMS.  Will assess for more serious causes such as DKA or other electrolyte abnormalities.  Will give 1 L NS and 10 units insulin .  Patient otherwise in no acute distress.  Recheck blood sugar with some improvement.  Also hyperkalemic and given IV and oral repletion.  While patient was getting second liter of fluid, he had a bowel movement with bright red blood per rectum.  Denies any similar prior episodes.  It appeared to be internal hemorrhoid bleeding.  Attempted for over an hour to get it  to stop but still had ongoing rapid bleeding and passing blood clots.  Stat CT GI protocol ordered.  General surgery consulted given severity of bleeding came to bedside.  Radiologist called me stating that he had active GI bleeding approximately 3.5 cm from the anus.  General surgery to take patient emergently to the OR overnight.  Patient remained with stable vital signs.  Repeat CBC was ordered.  Patient admitted to the hospitalist for further care. Signed out to oncoming ED provider until going to the OR.  The patient is on the cardiac monitor to evaluate for evidence of arrhythmia and/or significant heart rate changes. Clinical Course as of 06/11/23 0005  Wed Jun 10, 2023  2038 Sodium(!): 127 Corrected sodium of 135 based on his sugar.  Will give potassium repletion both IV and orally as well.  No indication of DKA [DW]  2213 Patient went to have a bowel movement and noticed bright red blood in his stool.  This is new for him.  Bedside exam does show pretty heavy bleeding coming out of his rectum at this point.  Appears to be coming potentially from a hemorrhoidal source.  Have packed the area and will reassess in 10 to 20 minutes to see if the bleeding is stopped. [DW]  2247 Continuing to have rectal bleeding. CT GI protocol ordered. If continuing to be rapid, will call general surgery. Blood pressure and HR continue to be stable [DW]  2346 General surgery at bedside [DW]  2346 Radiology -  active GI bleeding in rectum from 3.5cm from the anus. [DW]  2353 General surgery to take patient to the OR. Requests to admit to medicine [DW]    Clinical Course User Index [DW] Kandee Orion, MD     FINAL CLINICAL IMPRESSION(S) / ED DIAGNOSES   Final diagnoses:  Internal hemorrhoid, bleeding  Hyperglycemia  Hypokalemia     Rx / DC Orders   ED Discharge Orders     None        Note:  This document was prepared using Dragon voice recognition software and may include unintentional dictation errors.   Kandee Orion, MD 06/11/23 Raymon Caldron    Kandee Orion, MD 06/11/23 [phone]

## 2023-06-10 NOTE — ED Notes (Signed)
 Patient up to toilet independently to have bowel movement. Patient called RN to bedside for bright red blood in underwear. Dr Karlynn Oyster to bedside to assess.

## 2023-06-11 ENCOUNTER — Encounter
Admission: EM | Disposition: A | Discharge status: Home / Self Care | Payer: Self-pay | Source: Home / Self Care | Attending: Internal Medicine

## 2023-06-11 ENCOUNTER — Inpatient Hospital Stay: Payer: MEDICAID | Admitting: General Practice

## 2023-06-11 DIAGNOSIS — E1165 Type 2 diabetes mellitus with hyperglycemia: Secondary | ICD-10-CM

## 2023-06-11 DIAGNOSIS — D62 Acute posthemorrhagic anemia: Secondary | ICD-10-CM

## 2023-06-11 DIAGNOSIS — K648 Other hemorrhoids: Principal | ICD-10-CM

## 2023-06-11 DIAGNOSIS — Z5900 Homelessness unspecified: Secondary | ICD-10-CM | POA: Insufficient documentation

## 2023-06-11 DIAGNOSIS — E876 Hypokalemia: Secondary | ICD-10-CM

## 2023-06-11 DIAGNOSIS — I723 Aneurysm of iliac artery: Secondary | ICD-10-CM

## 2023-06-11 HISTORY — PX: RECTAL EXAM UNDER ANESTHESIA: SHX6399

## 2023-06-11 LAB — CBC WITH DIFFERENTIAL/PLATELET
Abs Immature Granulocytes: 0.04 10*3/uL (ref 0.00–0.07)
Basophils Absolute: 0 10*3/uL (ref 0.0–0.1)
Basophils Relative: 1 %
Eosinophils Absolute: 0.1 10*3/uL (ref 0.0–0.5)
Eosinophils Relative: 1 %
HCT: 29.5 % — ABNORMAL LOW (ref 39.0–52.0)
Hemoglobin: 9.8 g/dL — ABNORMAL LOW (ref 13.0–17.0)
Immature Granulocytes: 1 %
Lymphocytes Relative: 41 %
Lymphs Abs: 2.7 10*3/uL (ref 0.7–4.0)
MCH: 31 pg (ref 26.0–34.0)
MCHC: 33.2 g/dL (ref 30.0–36.0)
MCV: 93.4 fL (ref 80.0–100.0)
Monocytes Absolute: 0.8 10*3/uL (ref 0.1–1.0)
Monocytes Relative: 13 %
Neutro Abs: 2.9 10*3/uL (ref 1.7–7.7)
Neutrophils Relative %: 43 %
Platelets: 217 10*3/uL (ref 150–400)
RBC: 3.16 MIL/uL — ABNORMAL LOW (ref 4.22–5.81)
RDW: 12 % (ref 11.5–15.5)
Smear Review: NORMAL
WBC: 6.5 10*3/uL (ref 4.0–10.5)
nRBC: 0 % (ref 0.0–0.2)

## 2023-06-11 LAB — BASIC METABOLIC PANEL WITH GFR
Anion gap: 7 (ref 5–15)
BUN: 8 mg/dL (ref 8–23)
CO2: 29 mmol/L (ref 22–32)
Calcium: 7.8 mg/dL — ABNORMAL LOW (ref 8.9–10.3)
Chloride: 95 mmol/L — ABNORMAL LOW (ref 98–111)
Creatinine, Ser: 0.63 mg/dL (ref 0.61–1.24)
GFR, Estimated: >60 mL/min (ref >60)
Glucose, Bld: 272 mg/dL — ABNORMAL HIGH (ref 70–99)
Potassium: 3 mmol/L — ABNORMAL LOW (ref 3.5–5.1)
Sodium: 131 mmol/L — ABNORMAL LOW (ref 135–145)

## 2023-06-11 LAB — CBC
HCT: 26.1 % — ABNORMAL LOW (ref 39.0–52.0)
Hemoglobin: 8.8 g/dL — ABNORMAL LOW (ref 13.0–17.0)
MCH: 30.4 pg (ref 26.0–34.0)
MCHC: 33.7 g/dL (ref 30.0–36.0)
MCV: 90.3 fL (ref 80.0–100.0)
Platelets: 216 10*3/uL (ref 150–400)
RBC: 2.89 MIL/uL — ABNORMAL LOW (ref 4.22–5.81)
RDW: 12.2 % (ref 11.5–15.5)
WBC: 11.6 10*3/uL — ABNORMAL HIGH (ref 4.0–10.5)
nRBC: 0 % (ref 0.0–0.2)

## 2023-06-11 LAB — GLUCOSE, CAPILLARY
Glucose-Capillary: 335 mg/dL — ABNORMAL HIGH (ref 70–99)
Glucose-Capillary: 244 mg/dL — ABNORMAL HIGH (ref 70–99)
Glucose-Capillary: 212 mg/dL — ABNORMAL HIGH (ref 70–99)
Glucose-Capillary: 255 mg/dL — ABNORMAL HIGH (ref 70–99)
Glucose-Capillary: 166 mg/dL — ABNORMAL HIGH (ref 70–99)
Glucose-Capillary: 224 mg/dL — ABNORMAL HIGH (ref 70–99)
Glucose-Capillary: 167 mg/dL — ABNORMAL HIGH (ref 70–99)

## 2023-06-11 LAB — IRON AND TIBC
Iron: 76 ug/dL (ref 45–182)
Saturation Ratios: 26 % (ref 17.9–39.5)
TIBC: 298 ug/dL (ref 250–450)
UIBC: 222 ug/dL

## 2023-06-11 LAB — HEMOGLOBIN A1C
Hgb A1c MFr Bld: 14.3 % — ABNORMAL HIGH (ref 4.8–5.6)
Mean Plasma Glucose: 363.71 mg/dL

## 2023-06-11 LAB — FERRITIN: Ferritin: 629 ng/mL — ABNORMAL HIGH (ref 24–336)

## 2023-06-11 LAB — FOLATE: Folate: 7.4 ng/mL (ref >5.9)

## 2023-06-11 LAB — MAGNESIUM: Magnesium: 1.6 mg/dL — ABNORMAL LOW (ref 1.7–2.4)

## 2023-06-11 LAB — VITAMIN B12: Vitamin B-12: 447 pg/mL (ref 180–914)

## 2023-06-11 LAB — CBG MONITORING, ED: Glucose-Capillary: 322 mg/dL — ABNORMAL HIGH (ref 70–99)

## 2023-06-11 LAB — PHOSPHORUS: Phosphorus: 2.3 mg/dL — ABNORMAL LOW (ref 2.5–4.6)

## 2023-06-11 LAB — HIV ANTIBODY (ROUTINE TESTING W REFLEX): HIV Screen 4th Generation wRfx: NONREACTIVE

## 2023-06-11 SURGERY — EXAM UNDER ANESTHESIA, RECTUM
Anesthesia: General

## 2023-06-11 MED ORDER — BUPIVACAINE-EPINEPHRINE (PF) 0.5% -1:200000 IJ SOLN
INTRAMUSCULAR | Status: DC | PRN
  Administered 2023-06-11: 30 mL

## 2023-06-11 MED ORDER — MORPHINE SULFATE (PF) 2 MG/ML IV SOLN: 2.0000 mg | INTRAVENOUS | Status: DC | PRN

## 2023-06-11 MED ORDER — INSULIN ASPART 100 UNIT/ML IJ SOLN
0.0000 [IU] | Freq: Every day | INTRAMUSCULAR | Status: DC
  Administered 2023-06-11: 2 [IU] via SUBCUTANEOUS
  Filled 2023-06-11: qty 1

## 2023-06-11 MED ORDER — ONDANSETRON HCL 4 MG PO TABS: 4.0000 mg | ORAL_TABLET | Freq: Four times a day (QID) | ORAL | Status: DC | PRN

## 2023-06-11 MED ORDER — FENTANYL CITRATE (PF) 100 MCG/2ML IJ SOLN
INTRAMUSCULAR | Status: AC
  Filled 2023-06-11: qty 2

## 2023-06-11 MED ORDER — ONDANSETRON HCL 4 MG/2ML IJ SOLN: 4.0000 mg | Freq: Four times a day (QID) | INTRAMUSCULAR | Status: DC | PRN

## 2023-06-11 MED ORDER — EPHEDRINE SULFATE-NACL 50-0.9 MG/10ML-% IV SOSY
PREFILLED_SYRINGE | INTRAVENOUS | Status: DC | PRN
  Administered 2023-06-11: 10 mg via INTRAVENOUS

## 2023-06-11 MED ORDER — SODIUM CHLORIDE 0.9 % IV SOLN: INTRAVENOUS | Status: DC | PRN

## 2023-06-11 MED ORDER — PROPOFOL 10 MG/ML IV BOLUS
INTRAVENOUS | Status: AC
  Filled 2023-06-11: qty 20

## 2023-06-11 MED ORDER — HYDROCODONE-ACETAMINOPHEN 5-325 MG PO TABS: 1.0000 | ORAL_TABLET | ORAL | Status: DC | PRN

## 2023-06-11 MED ORDER — OXYCODONE HCL 5 MG PO TABS: 5.0000 mg | ORAL_TABLET | Freq: Once | ORAL | Status: DC | PRN

## 2023-06-11 MED ORDER — SUCCINYLCHOLINE CHLORIDE 200 MG/10ML IV SOSY
PREFILLED_SYRINGE | INTRAVENOUS | Status: DC | PRN
  Administered 2023-06-11: 100 mg via INTRAVENOUS

## 2023-06-11 MED ORDER — FENTANYL CITRATE (PF) 100 MCG/2ML IJ SOLN: 25.0000 ug | INTRAMUSCULAR | Status: DC | PRN

## 2023-06-11 MED ORDER — SUCCINYLCHOLINE CHLORIDE 200 MG/10ML IV SOSY
PREFILLED_SYRINGE | INTRAVENOUS | Status: AC
  Filled 2023-06-11: qty 10

## 2023-06-11 MED ORDER — BUPIVACAINE HCL (PF) 0.5 % IJ SOLN
INTRAMUSCULAR | Status: AC
  Filled 2023-06-11: qty 30

## 2023-06-11 MED ORDER — MIDAZOLAM HCL 2 MG/2ML IJ SOLN
INTRAMUSCULAR | Status: AC
  Filled 2023-06-11: qty 2

## 2023-06-11 MED ORDER — OXYCODONE HCL 5 MG/5ML PO SOLN: 5.0000 mg | Freq: Once | ORAL | Status: DC | PRN

## 2023-06-11 MED ORDER — PHENYLEPHRINE 80 MCG/ML (10ML) SYRINGE FOR IV PUSH (FOR BLOOD PRESSURE SUPPORT)
PREFILLED_SYRINGE | INTRAVENOUS | Status: AC
  Filled 2023-06-11: qty 10

## 2023-06-11 MED ORDER — ONDANSETRON 4 MG PO TBDP: 4.0000 mg | ORAL_TABLET | Freq: Four times a day (QID) | ORAL | Status: DC | PRN

## 2023-06-11 MED ORDER — INSULIN ASPART 100 UNIT/ML IJ SOLN
10.0000 [IU] | Freq: Once | INTRAMUSCULAR | Status: AC
  Administered 2023-06-11: 10 [IU] via SUBCUTANEOUS

## 2023-06-11 MED ORDER — EPINEPHRINE PF 1 MG/ML IJ SOLN
INTRAMUSCULAR | Status: AC
  Filled 2023-06-11: qty 1

## 2023-06-11 MED ORDER — INSULIN ASPART 100 UNIT/ML IJ SOLN
0.0000 [IU] | Freq: Three times a day (TID) | INTRAMUSCULAR | Status: DC
  Administered 2023-06-11: 3 [IU] via SUBCUTANEOUS
  Administered 2023-06-11: 5 [IU] via SUBCUTANEOUS
  Administered 2023-06-11: 8 [IU] via SUBCUTANEOUS
  Administered 2023-06-12: 11 [IU] via SUBCUTANEOUS
  Administered 2023-06-12: 3 [IU] via SUBCUTANEOUS
  Administered 2023-06-12 – 2023-06-13 (×2): 5 [IU] via SUBCUTANEOUS
  Filled 2023-06-11 (×7): qty 1

## 2023-06-11 MED ORDER — BUPIVACAINE LIPOSOME 1.3 % IJ SUSP
INTRAMUSCULAR | Status: AC
  Filled 2023-06-11: qty 20

## 2023-06-11 MED ORDER — GELATIN ABSORBABLE 12-7 MM EX MISC
CUTANEOUS | Status: AC
  Filled 2023-06-11: qty 1

## 2023-06-11 MED ORDER — INSULIN ASPART 100 UNIT/ML IJ SOLN
INTRAMUSCULAR | Status: AC
Start: 2023-06-11 — End: ?
  Filled 2023-06-11: qty 1

## 2023-06-11 MED ORDER — ALBUTEROL SULFATE (2.5 MG/3ML) 0.083% IN NEBU
2.5000 mg | INHALATION_SOLUTION | Freq: Four times a day (QID) | RESPIRATORY_TRACT | Status: DC | PRN
Start: 2023-06-11 — End: 2023-06-13

## 2023-06-11 MED ORDER — PROPOFOL 10 MG/ML IV BOLUS
INTRAVENOUS | Status: DC | PRN
  Administered 2023-06-11: 50 mg via INTRAVENOUS
  Administered 2023-06-11: 200 mg via INTRAVENOUS
  Administered 2023-06-11 (×2): 50 mg via INTRAVENOUS

## 2023-06-11 MED ORDER — GELATIN ABSORBABLE 12-7 MM EX MISC
CUTANEOUS | Status: DC | PRN
Start: 2023-06-11 — End: 2023-06-11
  Administered 2023-06-11: 1

## 2023-06-11 MED ORDER — ACETAMINOPHEN 650 MG RE SUPP
650.0000 mg | Freq: Four times a day (QID) | RECTAL | Status: DC | PRN
  Filled 2023-06-11: qty 1

## 2023-06-11 MED ORDER — POTASSIUM CHLORIDE CRYS ER 20 MEQ PO TBCR
40.0000 meq | EXTENDED_RELEASE_TABLET | ORAL | Status: AC
  Administered 2023-06-11 (×2): 40 meq via ORAL
  Filled 2023-06-11 (×2): qty 2

## 2023-06-11 MED ORDER — ONDANSETRON HCL 4 MG/2ML IJ SOLN
INTRAMUSCULAR | Status: DC | PRN
  Administered 2023-06-11: 4 mg via INTRAVENOUS

## 2023-06-11 MED ORDER — ONDANSETRON HCL 4 MG/2ML IJ SOLN
INTRAMUSCULAR | Status: AC
  Filled 2023-06-11: qty 2

## 2023-06-11 MED ORDER — LIDOCAINE HCL (PF) 2 % IJ SOLN
INTRAMUSCULAR | Status: AC
  Filled 2023-06-11: qty 5

## 2023-06-11 MED ORDER — MAGNESIUM SULFATE 2 GM/50ML IV SOLN
2.0000 g | Freq: Once | INTRAVENOUS | Status: AC
  Administered 2023-06-11: 2 g via INTRAVENOUS
  Filled 2023-06-11: qty 50

## 2023-06-11 MED ORDER — INSULIN ASPART 100 UNIT/ML IJ SOLN: 10.0000 [IU] | Freq: Once | INTRAMUSCULAR | Status: DC

## 2023-06-11 MED ORDER — FENTANYL CITRATE (PF) 100 MCG/2ML IJ SOLN
INTRAMUSCULAR | Status: DC | PRN
Start: 2023-06-11 — End: 2023-06-11
  Administered 2023-06-11 (×2): 50 ug via INTRAVENOUS

## 2023-06-11 MED ORDER — INSULIN ASPART 100 UNIT/ML IJ SOLN
INTRAMUSCULAR | Status: AC
  Filled 2023-06-11: qty 1

## 2023-06-11 MED ORDER — ACETAMINOPHEN 325 MG PO TABS: 650.0000 mg | ORAL_TABLET | Freq: Four times a day (QID) | ORAL | Status: DC | PRN

## 2023-06-11 MED ORDER — EPHEDRINE 5 MG/ML INJ
INTRAVENOUS | Status: AC
  Filled 2023-06-11: qty 5

## 2023-06-11 MED ORDER — ONDANSETRON HCL 4 MG/2ML IJ SOLN
4.0000 mg | Freq: Four times a day (QID) | INTRAMUSCULAR | Status: AC | PRN
Start: 2023-06-11 — End: ?

## 2023-06-11 MED ORDER — PHENYLEPHRINE 80 MCG/ML (10ML) SYRINGE FOR IV PUSH (FOR BLOOD PRESSURE SUPPORT)
PREFILLED_SYRINGE | INTRAVENOUS | Status: DC | PRN
  Administered 2023-06-11 (×5): 160 ug via INTRAVENOUS

## 2023-06-11 MED ORDER — LIDOCAINE HCL (CARDIAC) PF 100 MG/5ML IV SOSY
PREFILLED_SYRINGE | INTRAVENOUS | Status: DC | PRN
  Administered 2023-06-11: 100 mg via INTRAVENOUS

## 2023-06-11 MED ORDER — SODIUM CHLORIDE (PF) 0.9 % IJ SOLN
INTRAMUSCULAR | Status: AC
  Filled 2023-06-11: qty 50

## 2023-06-11 MED ORDER — INSULIN GLARGINE-YFGN 100 UNIT/ML ~~LOC~~ SOLN
10.0000 [IU] | Freq: Every day | SUBCUTANEOUS | Status: DC
Start: 2023-06-11 — End: 2023-06-13
  Administered 2023-06-11 – 2023-06-13 (×3): 10 [IU] via SUBCUTANEOUS
  Filled 2023-06-11 (×3): qty 0.1

## 2023-06-11 MED ORDER — MORPHINE SULFATE (PF) 4 MG/ML IV SOLN: 4.0000 mg | INTRAVENOUS | Status: DC | PRN

## 2023-06-11 SURGICAL SUPPLY — 34 items
BRIEF MESH DISP 2XL (UNDERPADS AND DIAPERS) ×1 IMPLANT
CNTNR URN SCR LID CUP LEK RST (MISCELLANEOUS) IMPLANT
DISSECTOR SURG LIGASURE 21 (MISCELLANEOUS) IMPLANT
DRAPE LAPAROTOMY 100X77 ABD (DRAPES) ×1 IMPLANT
DRAPE LEGGINS SURG 28X43 STRL (DRAPES) ×1 IMPLANT
DRAPE UNDER BUTTOCK W/FLU (DRAPES) ×1 IMPLANT
ELECTRODE REM PT RTRN 9FT ADLT (ELECTROSURGICAL) ×1 IMPLANT
GAUZE 4X4 16PLY ~~LOC~~+RFID DBL (SPONGE) ×1 IMPLANT
GAUZE SPONGE 4X4 12PLY STRL (GAUZE/BANDAGES/DRESSINGS) ×1 IMPLANT
GLOVE BIO SURGEON STRL SZ 6.5 (GLOVE) ×1 IMPLANT
GLOVE BIOGEL PI IND STRL 6.5 (GLOVE) ×1 IMPLANT
GOWN STRL REUS W/ TWL LRG LVL3 (GOWN DISPOSABLE) ×2 IMPLANT
HEMOSTAT SURGICEL 2X3 (HEMOSTASIS) IMPLANT
KIT TURNOVER CYSTO (KITS) ×1 IMPLANT
LABEL OR SOLS (LABEL) ×1 IMPLANT
MANIFOLD NEPTUNE II (INSTRUMENTS) ×1 IMPLANT
NDL HYPO 22X1.5 SAFETY MO (MISCELLANEOUS) ×1 IMPLANT
NEEDLE HYPO 22X1.5 SAFETY MO (MISCELLANEOUS) ×1 IMPLANT
NS IRRIG 500ML POUR BTL (IV SOLUTION) ×1 IMPLANT
PACK BASIN MINOR ARMC (MISCELLANEOUS) ×1 IMPLANT
PAD ABD DERMACEA PRESS 5X9 (GAUZE/BANDAGES/DRESSINGS) ×1 IMPLANT
PAD OB MATERNITY 11 LF (PERSONAL CARE ITEMS) ×1 IMPLANT
PAD PREP OB/GYN DISP 24X41 (PERSONAL CARE ITEMS) ×1 IMPLANT
SOLUTION PREP PVP 2OZ (MISCELLANEOUS) ×1 IMPLANT
STRAP SAFETY 5IN WIDE (MISCELLANEOUS) ×1 IMPLANT
SURGILUBE 2OZ TUBE FLIPTOP (MISCELLANEOUS) ×1 IMPLANT
SUT VIC AB 2-0 SH 27XBRD (SUTURE) ×1 IMPLANT
SUT VIC AB 2-0 UR6 27 (SUTURE) IMPLANT
SUT VIC AB 3-0 SH 27X BRD (SUTURE) ×1 IMPLANT
SUTURE EHLN 3-0 FS-10 30 BLK (SUTURE) IMPLANT
SYR 10ML LL (SYRINGE) ×1 IMPLANT
SYR BULB IRRIG 60ML STRL (SYRINGE) ×1 IMPLANT
TRAP FLUID SMOKE EVACUATOR (MISCELLANEOUS) ×1 IMPLANT
WATER STERILE IRR 500ML POUR (IV SOLUTION) ×1 IMPLANT

## 2023-06-11 NOTE — Assessment & Plan Note (Signed)
 Sliding scale insulin coverage

## 2023-06-11 NOTE — Anesthesia Preprocedure Evaluation (Signed)
 Anesthesia Evaluation  Patient identified by MRN, date of birth, ID band Patient awake    Reviewed: Allergy & Precautions, NPO status , Patient's Chart, lab work & pertinent test results  Airway Mallampati: III  TM Distance: >3 FB Neck ROM: full    Dental  (+) Chipped, Dental Advidsory Given   Pulmonary neg pulmonary ROS, Current Smoker   Pulmonary exam normal        Cardiovascular negative cardio ROS Normal cardiovascular exam     Neuro/Psych negative neurological ROS  negative psych ROS   GI/Hepatic negative GI ROS, Neg liver ROS,,,  Endo/Other  negative endocrine ROSdiabetes    Renal/GU      Musculoskeletal   Abdominal   Peds  Hematology negative hematology ROS (+)   Anesthesia Other Findings Past Medical History: No date: Alcohol abuse No date: Diabetes mellitus without complication (HCC)  History reviewed. No pertinent surgical history.  BMI    Body Mass Index: 22.15 kg/m      Reproductive/Obstetrics negative OB ROS                             Anesthesia Physical Anesthesia Plan  ASA: 3 and emergent  Anesthesia Plan: General ETT   Post-op Pain Management:    Induction: Intravenous and Rapid sequence  PONV Risk Score and Plan: 2 and Ondansetron , Dexamethasone and Midazolam   Airway Management Planned: Oral ETT  Additional Equipment:   Intra-op Plan:   Post-operative Plan: Extubation in OR  Informed Consent: I have reviewed the patients History and Physical, chart, labs and discussed the procedure including the risks, benefits and alternatives for the proposed anesthesia with the patient or authorized representative who has indicated his/her understanding and acceptance.     Dental Advisory Given  Plan Discussed with: Anesthesiologist, CRNA and Surgeon  Anesthesia Plan Comments: (Patient consented for risks of anesthesia including but not limited to:  -  adverse reactions to medications - damage to eyes, teeth, lips or other oral mucosa - nerve damage due to positioning  - sore throat or hoarseness - Damage to heart, brain, nerves, lungs, other parts of body or loss of life  Patient voiced understanding and assent.)       Anesthesia Quick Evaluation

## 2023-06-11 NOTE — Assessment & Plan Note (Signed)
 Will need vascular surveillance

## 2023-06-11 NOTE — Consult Note (Incomplete)
 SURGICAL CONSULTATION NOTE   HISTORY OF PRESENT ILLNESS (HPI):  63 y.o. male presented to [facility] ED for evaluation of hyperglycemia. Patient reports he had he has blood glucose check and he was found with 480 of glucose.  He said that he has been out of his diabetes medications.  He endorses feeling tired.  He denies abdominal pain.  He denies any pain radiation.  Denies any alleviating or aggravating factors.  At the ED, the CBG was 491.  CBC shows white blood cell count of 6.8 hemoglobin of 10.8.  He had a bowel movement in the ED and since then he has been having profuse bleeding.  Bleeding is bright red.  There is pain in the rectal area.  No radiation.  Aggravating factor is bowel movement.  There has been no alleviating factors.  I tried to do an anoscopy at bedside but due to profuse bleeding I was unable to identify the source of bleeding.  He had a CTA of the abdomen pelvis that shows active bleeding 3 cm from the anal verge.  I personally evaluated the images.  Surgery is consulted by Dr. Karlynn Oyster in this context for evaluation and management of lower GI bleeding.  PAST MEDICAL HISTORY (PMH):  Past Medical History:  Diagnosis Date  . Alcohol abuse   . Diabetes mellitus without complication (HCC)      PAST SURGICAL HISTORY (PSH):  History reviewed. No pertinent surgical history.   MEDICATIONS:  Prior to Admission medications   Medication Sig Start Date End Date Taking? Authorizing Provider  Blood Glucose Monitoring Suppl (TRUE METRIX METER) w/Device KIT use to check blood glucose 10/08/22   Iloabachie, Chioma E, NP  EPINEPHrine  0.3 mg/0.3 mL IJ SOAJ injection Inject 0.3 mg into the muscle as needed for anaphylaxis. 10/08/22   Iloabachie, Chioma E, NP  Glucose Blood (BLOOD GLUCOSE TEST STRIPS) STRP Use as directed to check blood sugar 4 (four) times daily. 10/08/22   Iloabachie, Chioma E, NP  TRUEplus Lancets 33G MISC Use as directed to check blood sugar up 4 (four) times daily. 10/08/22    Iloabachie, Chioma E, NP  metFORMIN  (GLUCOPHAGE ) 500 MG tablet Take 1 tablet (500 mg total) by mouth 2 (two) times daily with a meal. 03/18/23   Suellyn Emory, MD     ALLERGIES:  Allergies  Allergen Reactions  . Bee Venom      SOCIAL HISTORY:  Social History   Socioeconomic History  . Marital status: Legally Separated    Spouse name: Not on file  . Number of children: Not on file  . Years of education: Not on file  . Highest education level: Not on file  Occupational History  . Not on file  Tobacco Use  . Smoking status: Every Day    Current packs/day: 1.00    Types: Cigarettes  . Smokeless tobacco: Never  . Tobacco comments:    Sometimes list  Vaping Use  . Vaping status: Never Used  Substance and Sexual Activity  . Alcohol use: Yes    Alcohol/week: 22.0 standard drinks of alcohol    Types: 12 Cans of beer, 10 Shots of liquor per week  . Drug use: No    Comment: last used marijuana 08/09/16  . Sexual activity: Not on file  Other Topics Concern  . Not on file  Social History Narrative  . Not on file   Social Drivers of Health   Financial Resource Strain: High Risk (10/08/2022)   Overall Financial Resource Strain (CARDIA)   .  Difficulty of Paying Living Expenses: Very hard  Food Insecurity: Food Insecurity Present (10/08/2022)   Hunger Vital Sign   . Worried About Programme researcher, broadcasting/film/video in the Last Year: Often true   . Ran Out of Food in the Last Year: Often true  Transportation Needs: No Transportation Needs (10/08/2022)   PRAPARE - Transportation   . Lack of Transportation (Medical): No   . Lack of Transportation (Non-Medical): No  Physical Activity: Inactive (10/08/2022)   Exercise Vital Sign   . Days of Exercise per Week: 0 days   . Minutes of Exercise per Session: 0 min  Stress: No Stress Concern Present (10/08/2022)   [employer] of Occupational Health - Occupational Stress Questionnaire   . Feeling of Stress : Not at all  Social Connections:  Moderately Isolated (10/08/2022)   Social Connection and Isolation Panel [NHANES]   . Frequency of Communication with Friends and Family: Twice a week   . Frequency of Social Gatherings with Friends and Family: Twice a week   . Attends Religious Services: More than 4 times per year   . Active Member of Clubs or Organizations: No   . Attends Banker Meetings: Never   . Marital Status: Never married  Intimate Partner Violence: Not At Risk (05/16/2023)   Received from [facility]   Humiliation, Afraid, Rape, and Kick questionnaire   . Fear of Current or Ex-Partner: No   . Emotionally Abused: No   . Physically Abused: No   . Sexually Abused: No      FAMILY HISTORY:  Family History  Problem Relation Age of Onset  . Arthritis Mother      REVIEW OF SYSTEMS:  Constitutional: denies weight loss, fever, chills, or sweats  Eyes: denies any other vision changes, history of eye injury  ENT: denies sore throat, hearing problems  Respiratory: denies shortness of breath, wheezing  Cardiovascular: denies chest pain, palpitations  Gastrointestinal: ***abdominal pain, nausea and vomiting Genitourinary: denies burning with urination or urinary frequency Musculoskeletal: denies any other joint pains or cramps  Skin: denies any other rashes or skin discolorations  Neurological: denies any other headache, dizziness, weakness  Psychiatric: denies any other depression, anxiety   All other review of systems were negative   VITAL SIGNS:  Temp:  [97.8 F (36.6 C)] 97.8 F (36.6 C) (04/23 2013) Pulse Rate:  [76-84] 81 (04/23 2310) Resp:  [14-18] 14 (04/23 2310) BP: (113-145)/(85-100) 134/89 (04/23 2310) SpO2:  [100 %] 100 % (04/23 2310) Weight:  [68 kg] 68 kg (04/23 2014)     Height: 5\' 9"  (175.3 cm) Weight: 68 kg BMI (Calculated): 22.14   INTAKE/OUTPUT:  This shift: Total I/O In: 2100 [IV Piggyback:2100] Out: 175 [Urine:175]  Last 2 shifts: @IOLAST2SHIFTS @   PHYSICAL EXAM:   Constitutional:  -- ***Normal body habitus  -- ***Awake, alert, and oriented x3  Eyes:  -- Pupils equally round and reactive to light  -- No scleral icterus  Ear, nose, and throat:  -- ***No jugular venous distension  Pulmonary:  -- ***No crackles  -- ***Equal breath sounds bilaterally -- ***Breathing non-labored at rest Cardiovascular:  -- S1, S2 present  -- No pericardial rubs Gastrointestinal:  -- Abdomen ***soft, nontender, non-distended, no guarding or rebound tenderness -- ***No abdominal masses appreciated, pulsatile or otherwise  Musculoskeletal and Integumentary:  -- Wounds: ***None appreciated -- Extremities: ***B/L UE and LE FROM, hands and feet warm, ***no edema  Neurologic:  -- Motor function: ***intact and symmetric -- Sensation: ***  intact and symmetric   Labs:     Latest Ref Rng & Units 06/10/2023    8:18 PM 03/18/2023    7:15 PM 09/15/2022    1:51 PM  CBC  WBC 4.0 - 10.5 K/uL 6.8  8.8  4.7   Hemoglobin 13.0 - 17.0 g/dL 16.1  09.6  04.5   Hematocrit 39.0 - 52.0 % 31.8  35.6  38.9   Platelets 150 - 400 K/uL 249  259  194       Latest Ref Rng & Units 06/10/2023    8:18 PM 03/18/2023    7:15 PM 09/15/2022    1:51 PM  CMP  Glucose 70 - 99 mg/dL 409  811  914   BUN 8 - 23 mg/dL 8  7  5    Creatinine 0.61 - 1.24 mg/dL 7.82  9.56  2.13   Sodium 135 - 145 mmol/L 127  136  136   Potassium 3.5 - 5.1 mmol/L 2.9  3.5  3.3   Chloride 98 - 111 mmol/L 88  99  100   CO2 22 - 32 mmol/L 24  23  25    Calcium 8.9 - 10.3 mg/dL 8.4  9.0  8.3    {Labs :18171}  Imaging studies:  ***  Assessment/Plan:  62 y.o. male with ***, complicated by pertinent comorbidities including ***.    All of the above findings and recommendations were discussed with the patient ***and ***his/her family, and all of patient's ***and ***his/her family's questions were answered to ***his/her/their expressed satisfaction.  Lucila Rye, MD

## 2023-06-11 NOTE — ED Notes (Signed)
 Lab contacted to add on CBC.

## 2023-06-11 NOTE — TOC Initial Note (Addendum)
 Transition of Care [facility]) - Initial/Assessment Note    Patient Details  Name: Richard Estes MRN: [id number] Date of Birth: [birth year]  [facility]) CM/SW Contact:    Odilia Bennett, LCSW Phone Number: 06/11/2023, 3:00 PM  Clinical Narrative:   CSW met with patient. No family at bedside. CSW introduced role and inquired about not having a PCP or insurance. Patient confirmed he does not have a PCP but said he has BCBS. CSW sent secure email to financial counselors to verify. CSW provided local PCP packet. Will add another list to AVS if insurance verified. Patient stated he is homeless and will take the bus at discharge. CSW offered contact information for the shelter in [place] and [place] but patient declined, stating he will go back to the street. CSW added food and housing resources to AVS. No further concerns. CSW will continue to follow patient for support and facilitate discharge once medically stable.          4:20 pm: Patient's BCBS termed in January. CSW notified patient and provided intake paperwork for [facility].       Expected Discharge Plan:  (Street) Barriers to Discharge: Continued Medical Work up   Patient Goals and CMS Choice            Expected Discharge Plan and Services     Post Acute Care Choice: NA Living arrangements for the past 2 months: Homeless                                      Prior Living Arrangements/Services Living arrangements for the past 2 months: Homeless   Patient language and need for interpreter reviewed:: Yes Do you feel safe going back to the place where you live?: Yes      Need for Family Participation in Patient Care: Yes (Comment)     Criminal Activity/Legal Involvement Pertinent to Current Situation/Hospitalization: No - Comment as needed  Activities of Daily Living      Permission Sought/Granted                  Emotional Assessment Appearance:: Appears stated  age Attitude/Demeanor/Rapport: Engaged Affect (typically observed): Accepting, Appropriate Orientation: : Oriented to Self, Oriented to Place, Oriented to  Time, Oriented to Situation Alcohol / Substance Use: Not Applicable Psych Involvement: No (comment)  Admission diagnosis:  Hypokalemia [E87.6] Rectal bleeding [K62.5] Hyperglycemia [R73.9] Internal hemorrhoid, bleeding [K64.8] Patient Active Problem List   Diagnosis Date Noted   Uncontrolled type 2 diabetes mellitus with hyperglycemia, without long-term current use of insulin  (HCC) 06/11/2023   Unhoused person 06/11/2023   Hemorrhoids, internal, with bleeding 06/11/2023   ABLA (acute blood loss anemia) 06/11/2023   Hypokalemia 06/11/2023   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   Encounter to establish care 04/03/2022   Smoking 04/03/2022   Alcohol abuse 04/03/2022   PCP:  Vicente Graham, No Pharmacy:   [facility] [address] [place] [address] Phone: [phone] Fax: [phone]     Social Drivers of Health (SDOH) Social History: SDOH Screenings   Food Insecurity: Food Insecurity Present (10/08/2022)  Housing: Medium Risk (10/08/2022)  Transportation Needs: No Transportation Needs (10/08/2022)  Utilities: Not At Risk (10/08/2022)  Alcohol Screen: High Risk (10/08/2022)  Depression (PHQ2-9): Low Risk  (10/08/2022)  Financial Resource Strain: High Risk (10/08/2022)  Physical Activity: Inactive (10/08/2022)  Social Connections:  Moderately Isolated (10/08/2022)  Stress: No Stress Concern Present (10/08/2022)  Tobacco Use: High Risk (06/10/2023)  Health Literacy: Adequate Health Literacy (10/08/2022)   SDOH Interventions:     Readmission Risk Interventions     No data to display
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
## 2023-06-11 NOTE — ED Notes (Signed)
 Patient's clothing removed and placed into hospital belongings bags.

## 2023-06-11 NOTE — Plan of Care (Signed)
  Problem: Education: Goal: Knowledge of General Education information will improve Description: Including pain rating scale, medication(s)/side effects and non-pharmacologic comfort measures Outcome: Progressing   Problem: Health Behavior/Discharge Planning: Goal: Ability to manage health-related needs will improve Outcome: Progressing   Problem: Activity: Goal: Risk for activity intolerance will decrease Outcome: Progressing   Problem: Nutrition: Goal: Adequate nutrition will be maintained Outcome: Progressing   Problem: Elimination: Goal: Will not experience complications related to bowel motility Outcome: Progressing   Problem: Pain Managment: Goal: General experience of comfort will improve and/or be controlled Outcome: Progressing

## 2023-06-11 NOTE — Discharge Instructions (Addendum)
 Food Resources  Agency Name: [facility] Agency Address: [address], [place], [place] [address] Phone: [phone] Website: [email or web address] Service(s) Offered: Housing services, self-sufficiency, congregate meal program, weatherization program, Event organiser program, emergency food assistance,  housing counseling, home ownership program, wheels - to work program.  [employer] free for 60 and older at various locations from [employer], Monday-Friday:  [employer], [address] [place], [phone] -[facility], [address], [place] [phone]  -[facility], [address], [place] [phone]  -[address], [place], [phone]  Agency Name: [facility] Address: [phone] [address], [place], [place] [address] Phone: [phone] Website: [email or web address] Service(s) Offered: Home delivered hot, frozen, and emergency  meals. Grocery assistance program which matches  volunteers one-on-one with seniors unable to grocery shop  for themselves. Must be 60 years and older; less than 20  hours of in-home aide service, limited or no driving ability;  live alone or with someone with a disability; live in  [place].  Agency Name: [facility]) Address: [address] [place], [place] [address] Phone: [phone] Service(s) Offered: Food is served to shut-ins, homeless, elderly, and low income people in the community every Saturday (11:30 am-12:30 pm) and Sunday (12:30 pm-1:30pm). Volunteers also offer help and encouragement in seeking employment,  and spiritual guidance.  Agency Name: [facility] Social Services Address: [address] [place], [place] [address] Phone: [phone] Service(s) Offered: Child support services; child welfare services; food stamps; Medicaid; work first family assistance; and aid  with fuel,  rent, food and medicine.  Agency Name: [facility]itian Address: [address] [place], [place] Phone: [phone] Website: [email or web address] Services Offered: Monday 10:00am-12:00, 8:00pm-9:00pm, and Friday 10:00am-12:00.  Agency Name: [facility] [place] Address: [address], [place], [place] [address] Phone: [phone] Website: [email or web address] Service(s) Offered: Serves weekday meals, open from 11:30 am- 1:00 pm., and 6:30-7:30pm, Monday-Wednesday-Friday distributes food 3:30-6pm, Monday-Wednesday-Friday.  Agency Name: [facility] Address: [address], [place], [place] Phone: [phone] Website: [email or web address] Services Offered: Distributes food the 4th Saturday of the month, starting at 8:00 am  Agency Name: [facility] Address: [phone] [address] [place], [place] [address] Phone: [phone] Website: [email or web address] Service(s) Offered: Bread of life, weekly food pantry. Open Wednesdays from 10:00am-noon.  Agency Name: [facility] [employer] Address: [address] [place], [place], [place] Phone: [phone] Services Offered: Distributes food 9am-1pm, Monday-Thursday. Call for details.  Agency Name: [facility] Address: [address], [place], [place] [address] Phone: [phone] Website: [email or web address] Service(s) Offered: Games developer. Call for assistance.  Agency Name: [facility] Address: [address] [place], [place] [address] Phone: [phone] Service Offered: Emergency Food Pantry. Call for appointment.  Agency Name: [facility] Address: [address], [place], [place] [address] Phone: [phone] Website: [email or web address] Services Offered: Games developer. Call for details  Agency Name: [facility] Address: [address] [place], [place] Phone:  [phone] Website: [email or web address] Service(s) Offered: Emergency Food Pantry. Call for details.  Agency Name: Holiday representative Address: [address] [place], [place] [address] Phone: [phone] or [phone] Website: [email or web address] Service(s) Offered: Distribute food 9am-11:30 am, Tuesday-Friday, and 1-3:30pm, Monday-Friday. Food pantry Monday-Friday 1pm-3pm, fresh items, Mon.-Wed.-Fri.  Agency Name: [facility] Address: [address] [place], [place] [address] Phone: [phone] Website: [email or web address] Services Offered: Distribute food Tues and Sats from 9:00am-noon.  Closed 1st Saturday of each month. Call for details  Agency Name: [facility] Address: Adrianne [facility] [address] [place] [address] Phone: [phone]  Services Offered: Delivers meals every Thursday    Rent/Utility/Housing  Agency Name: [facility] Agency Address: [address] [place], [place] [address] Phone: [phone] Email: [email or web address] Website: [email or web address] Service(s) Offered: Housing services, self-sufficiency, congregate meal program, weatherization program, Field seismologist program, emergency food assistance,  housing counseling, home ownership program, wheels -towork program.  Agency Name: [facility] Address: [address] [place], [place] [address] Phone: [phone] (8a-4p) [phone] (8p- 10p) Email: [email or web address] Website: [email or web address] Service(s) Offered: A program for homeless and/or needy men that includes one-on-one counseling, life skills training and job rehabilitation.  Agency Name: [facility] [place] Address: [address] [place], [place] [address] Phone: [phone] Website: [email or web address] Service(s) Offered: Assistance to needy in emergency with  utility bills, heating fuel, and prescriptions. Shelter for homeless 7pm-7am. June 12, 2016 15  Agency Name: [facility] [place] [facility] Address: [address], [place], [place] [address] Phone: [phone] Contact Person: Genie Key Email: [email or web address] Website: [email or web address] Service(s) Offered: Helps individuals with developmental disabilities move from housing that is more restrictive to homes where they  can achieve greater independence and have more  opportunities.  Agency Name: [facility] Address: [address], [place], [place] [address] Phone: [phone] Email: [email or web address] Website: [email or web address].org Service(s) Offered: Provides affordable housing for low-income families, elderly, and disabled individuals. Offer a wide range of  programs and services, from financial planning to afterschool and summer programs.  Agency Name: Department of Social Services Address: [address] [place], [place] [address] Phone: [phone] Service(s) Offered: Child support services; child welfare services; food stamps; Medicaid; work first family assistance; and aid with fuel,  rent, food and medicine.  Agency Name: [facility] [place] [employer]. Address: [facility] [address] [place], [place]  [address] Phone: [phone] Website: www.familyabuseservices.org Service(s) Offered: 24 hour Crisis Line: [phone]; 24 hour Emergency Shelter; Transitional Housing; Support Groups; Scientist, physiological; [employer]; Hispanic Outreach: [phone];  Visitation Center: [phone].  Agency Name: [facility] [place]. Address: [address] [place], [place] [address] Phone: [phone] Service(s) Offered: CAP Services; Home and [employer]; Individual or Group Supports; Respite Care Non-Institutional Nursing;  Residential Supports; Respite Care and Personal Care  Services; Transportation; Family and Friends Night; Recreational Activities; Three Nutritious Meals/Snacks; Consultation with Registered Dietician; Twenty-four hour Registered Nurse Access; Daily and [employer]; Camp Green Leaves; [place] for the [employer] (During Summer Months) Bingo Night (Every  Wednesday Night); Special Populations Dance Night  (Every Tuesday Night); Professional Hair Care Services.  Agency Name: [facility] Address: [address], [place], [place] [address] Phone: [phone] Contact Person: Richardo Chandler Website: [email or web address].Physicist, medical) Offered: Residential treatment facility for women; food and  clothing, educational & employment development and  transportation to work; Counsellor of financial skills;  parenting and family reunification; emotional and spiritual  support; transitional housing for program graduates.  Agency Name: [facility] Address: [address] [place], [place] [address] Phone: [phone] Email: [email or web address] Website: [email or web address].es Service(s) Offered: Public housing units for elderly, disabled, and low income people; housing choice vouchers for income eligible  applicants; shelter plus care vouchers; and Psychologist, clinical.  Agency Name: [facility] [employer] Address: [address], [place], [place] [address] Phone: [phone] Email: [email or web address] Website:  www.habitatalamance.org Service(s) Offered: Build houses for families in need of decent housing. Each adult in the family must invest 200 hours of labor on  someone else's house, work with volunteers to build their own house, attend classes on budgeting, home maintenance, yard care, and attend homeowner association meetings.  Agency Name: [facility] Address: [address] [place], [place] [address] Phone: [phone] Website:  www.rsli.org Service(s) Offered: Intermediate care facilities for intellectually delayed, Supervised Living in group homes for adults with developmental disabilities, Supervised Living for people who have dual diagnoses (MRMI), Independent Living, Supported Living, respite and a variety of CAP services, pre-vocational services, day supports, and [employer].  Agency Name: [place] [facility] [facility] Phone: [phone] Website: www.NCForeclosurePrevention.gov Service(s) Offered: Zero-interest, deferred loans to homeowners struggling to pay their mortgage. Call for more information.  Transportation Resources for [employer]  Agency Name: [facility] Agency Address: [address] [place], [place] [address] Phone: [phone] Email: [email or web address] Website: www.[email or web address].org Service(s) Offered: Housing services, self-sufficiency, congregate meal program, weatherization program, Field seismologist program, emergency food assistance,  housing counseling, home ownership program, wheels-towork program.  Agency Name: [facility] [employer] [phone]) Address: [address] [place], [place] [address] Phone: [phone] Website: [email or web address].[email or web address] Service(s) Offered: Transportation for [employer], subscription and demand response; Dial-a-Ride for citizens 40 years of age or older.  Agency Name: Department of Social Services Address: [address] [place], [place] [address] Phone: [phone] Service(s) Offered: Child support services; child welfare services; food stamps; Medicaid; work first family assistance; and aid with fuel,  rent, food and medicine, transportation assistance.  Agency Name: Disabled [employer] (DAV) Transportation  Network Phone: [phone] Service(s) Offered: Transports veterans to the [facility]. Call  forty-eight hours in advance  and leave the name, telephone  number, date, and time of appointment. Veteran will be  contacted by the driver the day before the appointment to  arrange a pick up point    [employer] ACTA currently provides door to door services. ACTA connects with PART daily for services to [facility]. ACTA also performs contract services to [employer] operates 27 vehicles, all but 3 mini-vans are equipped with lifts for special needs as well as the general public. ACTA drivers are each CDL certified and trained in First Aid and CPR. ACTA was established in 2002 by [employer]. An independent Industrial/product designer. ACTA operates via Cytogeneticist with required local 10% match funding from [place]. ACTA provides over 80,000 passenger trips each year, including [facility] and [employer] sites.  Call at least by 11 AM one business day prior to needing transportation  [employer].                      [place], [place] [address]     Office Hours: Monday-Friday  8 AM - 5 PM

## 2023-06-11 NOTE — H&P (Addendum)
 History and Physical    Patient: Richard Estes AVW:[id number] DOB: 04-12-1960 DOA: 06/10/2023 DOS: the patient was seen and examined on 06/11/2023 PCP: Pcp, No  Patient coming from: Homeless  Chief Complaint:  Chief Complaint  Patient presents with   Hyperglycemia    HPI: Richard Estes is a 63 y.o. male with medical history significant for Diabetes on metformin  with frequent visits to the ED for symptomatic hyperglycemia being admitted at the request of surgery due to development of rectal bleeding from an internal hemorrhoid while being treated in the ED for hyperglycemia with hypokalemia.  Patient denies prior episodes of the same.  Denies abdominal pain, nausea or vomiting.  Patient is being seen in PACU prior to being taken to the OR ED course and data review: Vitals were within normal limits Hemoglobin 9.8, down from 11.9 a couple months prior Blood glucose 473 on arrival with normal anion gap and bicarb, improved with subcu insulin  treatment to 344 Initial potassium 2.9 CT angio bleed showed active gastrointestinal bleeding within the distal rectum and possible proctitis as well as a common iliac artery aneurysm.   Review of Systems: As mentioned in the history of present illness. All other systems reviewed and are negative.  Past Medical History:  Diagnosis Date   Alcohol abuse    Diabetes mellitus without complication (HCC)    History reviewed. No pertinent surgical history. Social History:  reports that he has been smoking cigarettes. He has never used smokeless tobacco. He reports current alcohol use of about 22.0 standard drinks of alcohol per week. He reports that he does not use drugs.  Allergies  Allergen Reactions   Bee Venom     Family History  Problem Relation Age of Onset   Arthritis Mother     Prior to Admission medications   Medication Sig Start Date End Date Taking? Authorizing Provider  albuterol  (VENTOLIN  HFA) 108 (90 Base) MCG/ACT inhaler Inhale  2 puffs into the lungs every 6 (six) hours as needed for shortness of breath or wheezing. 12/23/22 12/23/23 Yes [provider]  Blood Glucose Monitoring Suppl (TRUE METRIX METER) w/Device KIT use to check blood glucose 10/08/22   Iloabachie, Chioma E, NP  EPINEPHrine  0.3 mg/0.3 mL IJ SOAJ injection Inject 0.3 mg into the muscle as needed for anaphylaxis. 10/08/22   Iloabachie, Chioma E, NP  Glucose Blood (BLOOD GLUCOSE TEST STRIPS) STRP Use as directed to check blood sugar 4 (four) times daily. 10/08/22   Iloabachie, Chioma E, NP  TRUEplus Lancets 33G MISC Use as directed to check blood sugar up 4 (four) times daily. 10/08/22   Iloabachie, Chioma E, NP  metFORMIN  (GLUCOPHAGE ) 500 MG tablet Take 1 tablet (500 mg total) by mouth 2 (two) times daily with a meal. 03/18/23   Suellyn Emory, MD    Physical Exam: Vitals:   06/11/23 0115 06/11/23 0130 06/11/23 0145 06/11/23 0200  BP: (!) 121/97 (!) 131/91 (!) 133/93 (!) 139/95  Pulse: 86 82 86 87  Resp: 17 18 (!) 21 17  Temp:      TempSrc:      SpO2: 100% 100% 100% 100%  Weight:      Height:       Physical Exam Vitals and nursing note reviewed.  Constitutional:      General: He is not in acute distress. HENT:     Head: Normocephalic and atraumatic.  Cardiovascular:     Rate and Rhythm: Normal rate and regular rhythm.     Heart  sounds: Normal heart sounds.  Pulmonary:     Effort: Pulmonary effort is normal.     Breath sounds: Normal breath sounds.  Abdominal:     Palpations: Abdomen is soft.     Tenderness: There is no abdominal tenderness.  Neurological:     Mental Status: Mental status is at baseline.     Labs on Admission: I have personally reviewed following labs and imaging studies  CBC: Recent Labs  Lab 06/10/23 2018 06/10/23 2317  WBC 6.8 6.5  NEUTROABS  --  2.9  HGB 10.8* 9.8*  HCT 31.8* 29.5*  MCV 90.9 93.4  PLT 249 217   Basic Metabolic Panel: Recent Labs  Lab 06/10/23 2018  NA 127*  K 2.9*  CL 88*   CO2 24  GLUCOSE 473*  BUN 8  CREATININE 0.60*  CALCIUM 8.4*   GFR: Estimated Creatinine Clearance: 92.1 mL/min (A) (by C-G formula based on SCr of 0.6 mg/dL (L)). Liver Function Tests: No results for input(s): "AST", "ALT", "ALKPHOS", "BILITOT", "PROT", "ALBUMIN" in the last 168 hours. No results for input(s): "LIPASE", "AMYLASE" in the last 168 hours. No results for input(s): "AMMONIA" in the last 168 hours. Coagulation Profile: Recent Labs  Lab 06/10/23 2317  INR 1.2   Cardiac Enzymes: No results for input(s): "CKTOTAL", "CKMB", "CKMBINDEX", "TROPONINI" in the last 168 hours. BNP (last 3 results) No results for input(s): "PROBNP" in the last 8760 hours. HbA1C: No results for input(s): "HGBA1C" in the last 72 hours. CBG: Recent Labs  Lab 06/10/23 2016 06/10/23 2115  GLUCAP 491* 344*   Lipid Profile: No results for input(s): "CHOL", "HDL", "LDLCALC", "TRIG", "CHOLHDL", "LDLDIRECT" in the last 72 hours. Thyroid Function Tests: No results for input(s): "TSH", "T4TOTAL", "FREET4", "T3FREE", "THYROIDAB" in the last 72 hours. Anemia Panel: No results for input(s): "VITAMINB12", "FOLATE", "FERRITIN", "TIBC", "IRON", "RETICCTPCT" in the last 72 hours. Urine analysis:    Component Value Date/Time   COLORURINE YELLOW (A) 06/10/2023 2051   APPEARANCEUR CLEAR (A) 06/10/2023 2051   APPEARANCEUR Clear 06/11/2014 0959   LABSPEC 1.029 06/10/2023 2051   LABSPEC 1.023 06/11/2014 0959   PHURINE 7.0 06/10/2023 2051   GLUCOSEU >=500 (A) 06/10/2023 2051   GLUCOSEU Negative 06/11/2014 0959   HGBUR NEGATIVE 06/10/2023 2051   BILIRUBINUR NEGATIVE 06/10/2023 2051   BILIRUBINUR Negative 06/11/2014 0959   KETONESUR NEGATIVE 06/10/2023 2051   PROTEINUR NEGATIVE 06/10/2023 2051   NITRITE NEGATIVE 06/10/2023 2051   LEUKOCYTESUR NEGATIVE 06/10/2023 2051   LEUKOCYTESUR Negative 06/11/2014 0959    Radiological Exams on Admission: CT ANGIO GI BLEED Result Date: 06/10/2023 CLINICAL DATA:   Rectal bleeding. EXAM: CTA ABDOMEN AND PELVIS WITHOUT AND WITH CONTRAST TECHNIQUE: Multidetector CT imaging of the abdomen and pelvis was performed using the standard protocol during bolus administration of intravenous contrast. Multiplanar reconstructed images and MIPs were obtained and reviewed to evaluate the vascular anatomy. RADIATION DOSE REDUCTION: This exam was performed according to the departmental dose-optimization program which includes automated exposure control, adjustment of the mA and/or kV according to patient size and/or use of iterative reconstruction technique. CONTRAST:  OMNIPAQUE  IOHEXOL  350 MG/ML SOLN COMPARISON:  None Available. FINDINGS: VASCULAR Aorta: Normal caliber aorta without aneurysm, dissection, vasculitis or significant stenosis. There is mild calcified atherosclerotic disease throughout the aorta. Celiac: Patent without evidence of aneurysm, dissection, vasculitis or significant stenosis. SMA: Patent without evidence of aneurysm, dissection, vasculitis or significant stenosis. Renals: Both renal arteries are patent without evidence of aneurysm, dissection, vasculitis, fibromuscular dysplasia or significant stenosis.  There is calcified atherosclerotic disease present. IMA: Patent without evidence of aneurysm, dissection, vasculitis or significant stenosis. Inflow: There is aneurysmal dilatation of the right common iliac artery measuring 2.3 cm. No dissection. There is calcified atherosclerotic disease throughout. No significant stenosis. Proximal Outflow: Bilateral common femoral and visualized portions of the superficial and profunda femoral arteries are patent without evidence of aneurysm, dissection, vasculitis or significant stenosis. Veins: No obvious venous abnormality within the limitations of this arterial phase study. Review of the MIP images confirms the above findings. NON-VASCULAR Lower chest: No acute abnormality. Hepatobiliary: No focal liver abnormality is seen.  No gallstones, gallbladder wall thickening, or biliary dilatation. Pancreas: Unremarkable. No pancreatic ductal dilatation or surrounding inflammatory changes. Spleen: Normal in size without focal abnormality. Adrenals/Urinary Tract: Adrenal glands are unremarkable. Kidneys are normal, without renal calculi, focal lesion, or hydronephrosis. Bladder is unremarkable. Stomach/Bowel: There is active gastrointestinal bleeding within the distal rectum approximately 3.5 cm proximal to the anise. There is some distal rectal wall thickening. No dilated bowel loops are seen. The appendix is not visualized. Lymphatic: No enlarged lymph nodes are seen. Reproductive: Prostate gland is within normal limits. Other: There is a small amount of free fluid throughout the abdomen and pelvis. There is mild body wall edema. There is no focal abdominal wall hernia. Musculoskeletal: Degenerative changes affect the spine. IMPRESSION: 1. Active gastrointestinal bleeding within the distal rectum. 2. 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 3. Rectal wall thickening worrisome for proctitis. 4. Small amount of ascites. 5. Body wall edema. Aortic Atherosclerosis (ICD10-I70.0). These results were called by telephone at the time of interpretation on 06/10/2023 at 11:46 pm to provider DAVID [facility] , who verbally acknowledged these results. Electronically Signed   By: Tyron Gallon M.D.   On: 06/10/2023 23:48     Data Reviewed: Relevant notes from primary care and specialist visits, past discharge summaries as available in EHR, including Care Everywhere. Prior diagnostic testing as pertinent to current admission diagnoses Updated medications and problem lists for reconciliation ED course, including vitals, labs, imaging, treatment and response to treatment Triage notes, nursing and pharmacy notes and ED provider's notes Notable results as noted in HPI   Assessment and Plan: * Hemorrhoids, internal, with bleeding Acute blood loss  anemia CTA showing acute rectal bleeding Patient being taken emergently to the OR Monitor hemoglobin and transfuse if needed Surgical consult to follow  Uncontrolled type 2 diabetes mellitus with hyperglycemia, without long-term current use of insulin  (HCC) Sliding scale insulin  coverage  Hypokalemia Received oral potassium from the ED  Aneurysm of common iliac artery (HCC) Will need vascular surveillance  Unhoused person [facility] consult        DVT prophylaxis: SCD  Consults: Surgery, Dr Charmel Cooter  Advance Care Planning:   Code Status: Prior   Family Communication: none  Disposition Plan: Back to previous home environment  Severity of Illness: The appropriate patient status for this patient is INPATIENT. Inpatient status is judged to be reasonable and necessary in order to provide the required intensity of service to ensure the patient's safety. The patient's presenting symptoms, physical exam findings, and initial radiographic and laboratory data in the context of their chronic comorbidities is felt to place them at high risk for further clinical deterioration. Furthermore, it is not anticipated that the patient will be medically stable for discharge from the hospital within 2 midnights of admission.   * I certify that at the point of admission it is my clinical judgment that the patient will  require inpatient hospital care spanning beyond 2 midnights from the point of admission due to high intensity of service, high risk for further deterioration and high frequency of surveillance required.*  Author: Lanetta Pion, MD 06/11/2023 3:39 AM  For on call review [email or web address].

## 2023-06-11 NOTE — Op Note (Signed)
 Preoperative diagnosis: Rectal bleeding   Postoperative diagnosis: Rectal bleeding due to an ulcer                                             Rectal polyp  Procedure: Rectal exam under anesthesia                     Bleeding controlled with ligation of bleeding of rectal ulcer                     Rectal polyp biopsy  Surgeon: Dr. Dortha Gauss  Anesthesia: General  Wound classification: Contaminated  Indications: Patient is a 63 y.o. male was found to have symptomatic profuse bleeding at the ED unable to be controlled with bedside anoscopy  Findings: 1.  Anal ulcer at the 3 o'clock position just proximal to the anal sphincter with active bleeding.  No sign of perianal abscess or fistulous track. 2.  Rectal polyp at the 11 o'clock position 4 cm from the anal verge 3. Adequate hemostasis  Description of procedure: The patient was brought to the operating room and spinal anesthesia was induced. Patient was placed in the prone jackknife position. A time-out was completed verifying correct patient, procedure, site, positioning, and implant(s) and/or special equipment prior to beginning this procedure. The buttocks were taped apart.  The perineum was prepped and draped in standard sterile fashion. Local anesthetic was injected as a perianal block. An anoscope was introduced and the anal canal was inspected.  There was a ulcerated lesion at the 3 o'clock position just proximal to the anal sphincter.  This showed active bleeding.  With the rectal biopsy forceps a sample of the ulcer was taken for biopsy.  With 2-0 Vicryl multiple interrupted stitches were used for ligation of ulcer to control bleeding.  Complete control of bleeding was achieved.  A small polyp was identified at the 11 o'clock position.  Biopsy of the polyp was done with rectal biopsy forcep.  Bleeding was controlled with cautery.  The anal canal was irrigated with saline without any further bleeding identified.  Surgifoam was left  in place.  A gauze pad was tucked between the gluteal folds.  The patient tolerated the procedure well and was taken to the postanesthesia care unit in stable condition.   Specimen: Rectal polyp                    Anal ulcer  Complications: none  EBL: 50 mL

## 2023-06-11 NOTE — Anesthesia Procedure Notes (Signed)
 Procedure Name: Intubation Date/Time: 06/11/2023 2:34 AM  Performed by: Mertie Abt, CRNAPre-anesthesia Checklist: Patient identified, Patient being monitored, Timeout performed, Emergency Drugs available and Suction available Patient Re-evaluated:Patient Re-evaluated prior to induction Oxygen Delivery Method: Circle system utilized Preoxygenation: Pre-oxygenation with 100% oxygen Induction Type: IV induction Ventilation: Mask ventilation without difficulty Laryngoscope Size: McGrath and 4 Grade View: Grade I Tube type: Oral Tube size: 7.0 mm Number of attempts: 1 Airway Equipment and Method: Stylet and Video-laryngoscopy Placement Confirmation: ETT inserted through vocal cords under direct vision, positive ETCO2 and breath sounds checked- equal and bilateral Secured at: 22 cm Tube secured with: Tape Dental Injury: Teeth and Oropharynx as per pre-operative assessment

## 2023-06-11 NOTE — Assessment & Plan Note (Signed)
 TOC consult ?

## 2023-06-11 NOTE — Transfer of Care (Signed)
 Immediate Anesthesia Transfer of Care Note  Patient: Richard Estes  Procedure(s) Performed: EXAM UNDER ANESTHESIA, RECTUM  Patient Location: PACU  Anesthesia Type:General  Level of Consciousness: awake  Airway & Oxygen Therapy: Patient Spontanous Breathing and Patient connected to face mask oxygen  Post-op Assessment: Report given to RN and Post -op Vital signs reviewed and stable  Post vital signs: Reviewed and stable  Last Vitals:  Vitals Value Taken Time  BP 104/80 06/11/23 0333  Temp 36.6 C 06/11/23 0333  Pulse 86 06/11/23 0342  Resp 12 06/11/23 0342  SpO2 100 % 06/11/23 0342  Vitals shown include unfiled device data.  Last Pain:  Vitals:   06/11/23 0333  TempSrc:   PainSc: Asleep         Complications: No notable events documented.

## 2023-06-11 NOTE — Assessment & Plan Note (Signed)
 Acute blood loss anemia CTA showing acute rectal bleeding Patient being taken emergently to the OR Monitor hemoglobin and transfuse if needed Surgical consult to follow

## 2023-06-11 NOTE — Hospital Course (Signed)
 Diabetes, with frequent ED visits for symptomatic hyperglycemia, being admitted for rectal bleeding that developed while being evaluated in the ED for hyperglycemia and hypokalemia.  He is being taken emergently to the OR and medicine admission requested ED course and data review:

## 2023-06-11 NOTE — Consult Note (Signed)
 SURGICAL CONSULTATION NOTE   HISTORY OF PRESENT ILLNESS (HPI):  63 y.o. male presented to [facility] ED for evaluation of hyperglycemia. Patient reports he had he has blood glucose check and he was found with 480 of glucose.  He said that he has been out of his diabetes medications.  He endorses feeling tired.  He denies abdominal pain.  He denies any pain radiation.  Denies any alleviating or aggravating factors.  At the ED, the CBG was 491.  CBC shows white blood cell count of 6.8 hemoglobin of 10.8.  He had a bowel movement in the ED and since then he has been having profuse bleeding.  Bleeding is bright red.  There is pain in the rectal area.  No radiation.  Aggravating factor is bowel movement.  There has been no alleviating factors.   During my evaluation I found the patient full of blood in the bed with abundant amount of clots.  There is definitely profuse active bleeding.  I tried to do an anoscopy at bedside but due to profuse bleeding I was unable to identify the source of bleeding.  He had a CTA of the abdomen pelvis that shows active bleeding 3 cm from the anal verge.  I personally evaluated the images.  Surgery is consulted by Dr. Karlynn Oyster in this context for evaluation and management of lower GI bleeding.  PAST MEDICAL HISTORY (PMH):  Past Medical History:  Diagnosis Date   Alcohol abuse    Diabetes mellitus without complication (HCC)      PAST SURGICAL HISTORY (PSH):  History reviewed. No pertinent surgical history.   MEDICATIONS:  Prior to Admission medications   Medication Sig Start Date End Date Taking? Authorizing Provider  Blood Glucose Monitoring Suppl (TRUE METRIX METER) w/Device KIT use to check blood glucose 10/08/22   Iloabachie, Chioma E, NP  EPINEPHrine  0.3 mg/0.3 mL IJ SOAJ injection Inject 0.3 mg into the muscle as needed for anaphylaxis. 10/08/22   Iloabachie, Chioma E, NP  Glucose Blood (BLOOD GLUCOSE TEST STRIPS) STRP Use as directed to check blood sugar 4 (four) times  daily. 10/08/22   Iloabachie, Chioma E, NP  TRUEplus Lancets 33G MISC Use as directed to check blood sugar up 4 (four) times daily. 10/08/22   Iloabachie, Chioma E, NP  metFORMIN  (GLUCOPHAGE ) 500 MG tablet Take 1 tablet (500 mg total) by mouth 2 (two) times daily with a meal. 03/18/23   Suellyn Emory, MD     ALLERGIES:  Allergies  Allergen Reactions   Bee Venom      SOCIAL HISTORY:  Social History   Socioeconomic History   Marital status: Legally Separated    Spouse name: Not on file   Number of children: Not on file   Years of education: Not on file   Highest education level: Not on file  Occupational History   Not on file  Tobacco Use   Smoking status: Every Day    Current packs/day: 1.00    Types: Cigarettes   Smokeless tobacco: Never   Tobacco comments:    Sometimes list  Vaping Use   Vaping status: Never Used  Substance and Sexual Activity   Alcohol use: Yes    Alcohol/week: 22.0 standard drinks of alcohol    Types: 12 Cans of beer, 10 Shots of liquor per week   Drug use: No    Comment: last used marijuana 08/09/16   Sexual activity: Not on file  Other Topics Concern   Not on file  Social History  Narrative   Not on file   Social Drivers of Health   Financial Resource Strain: High Risk (10/08/2022)   Overall Financial Resource Strain (CARDIA)    Difficulty of Paying Living Expenses: Very hard  Food Insecurity: Food Insecurity Present (10/08/2022)   Hunger Vital Sign    Worried About Running Out of Food in the Last Year: Often true    Ran Out of Food in the Last Year: Often true  Transportation Needs: No Transportation Needs (10/08/2022)   PRAPARE - Administrator, Civil Service (Medical): No    Lack of Transportation (Non-Medical): No  Physical Activity: Inactive (10/08/2022)   Exercise Vital Sign    Days of Exercise per Week: 0 days    Minutes of Exercise per Session: 0 min  Stress: No Stress Concern Present (10/08/2022)   [employer] of  Occupational Health - Occupational Stress Questionnaire    Feeling of Stress : Not at all  Social Connections: Moderately Isolated (10/08/2022)   Social Connection and Isolation Panel [NHANES]    Frequency of Communication with Friends and Family: Twice a week    Frequency of Social Gatherings with Friends and Family: Twice a week    Attends Religious Services: More than 4 times per year    Active Member of [employer] or Organizations: No    Attends Banker Meetings: Never    Marital Status: Never married  Intimate Partner Violence: Not At Risk (05/16/2023)   Received from [facility]   Humiliation, Afraid, Rape, and Kick questionnaire    Fear of Current or Ex-Partner: No    Emotionally Abused: No    Physically Abused: No    Sexually Abused: No      FAMILY HISTORY:  Family History  Problem Relation Age of Onset   Arthritis Mother      REVIEW OF SYSTEMS:  Constitutional: denies weight loss, fever, chills, or sweats.  Positive for fatigue Eyes: denies any other vision changes, history of eye injury  ENT: denies sore throat, hearing problems  Respiratory: denies shortness of breath, wheezing  Cardiovascular: denies chest pain, palpitations  Gastrointestinal: Denies abdominal pain, nausea and vomiting Genitourinary: denies burning with urination or urinary frequency Musculoskeletal: denies any other joint pains or cramps  Skin: denies any other rashes or skin discolorations  Neurological: denies any other headache, dizziness, weakness  Psychiatric: denies any other depression, anxiety   All other review of systems were negative   VITAL SIGNS:  Temp:  [97.8 F (36.6 C)] 97.8 F (36.6 C) (04/23 2013) Pulse Rate:  [76-84] 81 (04/23 2310) Resp:  [14-18] 14 (04/23 2310) BP: (113-145)/(85-100) 134/89 (04/23 2310) SpO2:  [100 %] 100 % (04/23 2310) Weight:  [68 kg] 68 kg (04/23 2014)     Height: 5\' 9"  (175.3 cm) Weight: 68 kg BMI (Calculated): 22.14   INTAKE/OUTPUT:   This shift: Total I/O In: 2100 [IV Piggyback:2100] Out: 175 [Urine:175]  Last 2 shifts: @IOLAST2SHIFTS @   PHYSICAL EXAM:  Constitutional:  -- Normal body habitus  -- Awake, alert, and oriented x3  Eyes:  -- Pupils equally round and reactive to light  -- No scleral icterus  Ear, nose, and throat:  -- No jugular venous distension  Pulmonary:  -- No crackles  -- Equal breath sounds bilaterally -- Breathing non-labored at rest Cardiovascular:  -- S1, S2 present  -- No pericardial rubs Gastrointestinal:  -- Abdomen soft, nontender, non-distended, no guarding or rebound tenderness -- Rectal exam with profuse bleeding.  Unable to identify the source of bleeding due to the amount of blood and no adequate equipment at bedside.  Adequate rectal tone. Musculoskeletal and Integumentary:  -- Wounds: None appreciated -- Extremities: B/L UE and LE FROM, hands and feet warm, no edema  Neurologic:  -- Motor function: intact and symmetric -- Sensation: intact and symmetric   Labs:     Latest Ref Rng & Units 06/10/2023    8:18 PM 03/18/2023    7:15 PM 09/15/2022    1:51 PM  CBC  WBC 4.0 - 10.5 K/uL 6.8  8.8  4.7   Hemoglobin 13.0 - 17.0 g/dL 16.1  09.6  04.5   Hematocrit 39.0 - 52.0 % 31.8  35.6  38.9   Platelets 150 - 400 K/uL 249  259  194       Latest Ref Rng & Units 06/10/2023    8:18 PM 03/18/2023    7:15 PM 09/15/2022    1:51 PM  CMP  Glucose 70 - 99 mg/dL 409  811  914   BUN 8 - 23 mg/dL 8  7  5    Creatinine 0.61 - 1.24 mg/dL 7.82  9.56  2.13   Sodium 135 - 145 mmol/L 127  136  136   Potassium 3.5 - 5.1 mmol/L 2.9  3.5  3.3   Chloride 98 - 111 mmol/L 88  99  100   CO2 22 - 32 mmol/L 24  23  25    Calcium 8.9 - 10.3 mg/dL 8.4  9.0  8.3     Imaging studies:  EXAM: CTA ABDOMEN AND PELVIS WITHOUT AND WITH CONTRAST   TECHNIQUE: Multidetector CT imaging of the abdomen and pelvis was performed using the standard protocol during bolus administration of intravenous contrast.  Multiplanar reconstructed images and MIPs were obtained and reviewed to evaluate the vascular anatomy.   RADIATION DOSE REDUCTION: This exam was performed according to the departmental dose-optimization program which includes automated exposure control, adjustment of the mA and/or kV according to patient size and/or use of iterative reconstruction technique.   CONTRAST:  OMNIPAQUE  IOHEXOL  350 MG/ML SOLN   COMPARISON:  None Available.   FINDINGS: VASCULAR   Aorta: Normal caliber aorta without aneurysm, dissection, vasculitis or significant stenosis. There is mild calcified atherosclerotic disease throughout the aorta.   Celiac: Patent without evidence of aneurysm, dissection, vasculitis or significant stenosis.   SMA: Patent without evidence of aneurysm, dissection, vasculitis or significant stenosis.   Renals: Both renal arteries are patent without evidence of aneurysm, dissection, vasculitis, fibromuscular dysplasia or significant stenosis. There is calcified atherosclerotic disease present.   IMA: Patent without evidence of aneurysm, dissection, vasculitis or significant stenosis.   Inflow: There is aneurysmal dilatation of the right common iliac artery measuring 2.3 cm. No dissection. There is calcified atherosclerotic disease throughout. No significant stenosis.   Proximal Outflow: Bilateral common femoral and visualized portions of the superficial and profunda femoral arteries are patent without evidence of aneurysm, dissection, vasculitis or significant stenosis.   Veins: No obvious venous abnormality within the limitations of this arterial phase study.   Review of the MIP images confirms the above findings.   NON-VASCULAR   Lower chest: No acute abnormality.   Hepatobiliary: No focal liver abnormality is seen. No gallstones, gallbladder wall thickening, or biliary dilatation.   Pancreas: Unremarkable. No pancreatic ductal dilatation or surrounding  inflammatory changes.   Spleen: Normal in size without focal abnormality.   Adrenals/Urinary Tract: Adrenal glands are unremarkable. Kidneys are normal,  without renal calculi, focal lesion, or hydronephrosis. Bladder is unremarkable.   Stomach/Bowel: There is active gastrointestinal bleeding within the distal rectum approximately 3.5 cm proximal to the anise. There is some distal rectal wall thickening. No dilated bowel loops are seen. The appendix is not visualized.   Lymphatic: No enlarged lymph nodes are seen.   Reproductive: Prostate gland is within normal limits.   Other: There is a small amount of free fluid throughout the abdomen and pelvis. There is mild body wall edema. There is no focal abdominal wall hernia.   Musculoskeletal: Degenerative changes affect the spine.   IMPRESSION: 1. Active gastrointestinal bleeding within the distal rectum. 2. 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 3. Rectal wall thickening worrisome for proctitis. 4. Small amount of ascites. 5. Body wall edema.   Aortic Atherosclerosis (ICD10-I70.0).   These results were called by telephone at the time of interpretation on 06/10/2023 at 11:46 pm to provider [facility] , who verbally acknowledged these results.     Electronically Signed   By: Tyron Gallon M.D.   On: 06/10/2023 23:48    Assessment/Plan:  63 y.o. male with lower GI bleeding, complicated by pertinent comorbidities including uncontrolled diabetes, hypokalemia, alcohol dependence.  Lower GI bleeding - As per CTA source most likely on the anal verge, possible hemorrhoid.  Patient without history of hemorrhoids - Will take to the operating room for rectal exam under anesthesia for bleeding control.  Possible hemorrhoidectomy - Will take the patient to the OR in an emergent basis due to severe active bleeding and unable to control source at bedside. - Expecting adequate control of bleeding, patient will be able to be discharged  once medically stable   Uncontrolled diabetes - Patient with hyperglycemia of 490 upon arrival to the ED.  Last CBG glucose is 244 - Diabetes management as per Hospitalist  Hypokalemia - Patient received 10 mK of potassium IV and 40 mill disturbance of potassium orally - Continue replacement as per primary team  Lucila Rye, MD

## 2023-06-11 NOTE — ED Notes (Signed)
 Patient placed in briefs to help contain clots coming out of rectum. Warm blankets provided.

## 2023-06-11 NOTE — Progress Notes (Signed)
 Interval events noted.  He has no complaints.  No rectal bleeding.   Vital signs are stable. Repeat potassium is 3 and magnesium  is 1.6, sodium 131. Hemoglobin is 8.8. Glucose levels are still significantly elevated.  Hemoglobin A1c is 14.3. Hemoglobin A1c was 9.68 months ago.   Briefly, Richard Estes is a 63 year old man with medical history significant for type II DM on metformin  (defaulted treatment because he ran out of metformin ), who presented to the hospital because of hyperglycemia.  In the ED, he was noted to have rectal bleeding from an internal hemorrhoid. He was evaluated by the general surgeon and he underwent ligation of bleeding rectal ulcer and rectal polyp biopsy.   Start Lantus  10 units daily.  Continue NovoLog  as needed for hyperglycemia.  Follow-up with diabetes coordinator. Replete potassium with oral potassium chloride  and monitor levels Replete magnesium  with IV magnesium  sulfate No evidence of iron deficiency anemia. No indication for blood transfusion at this time. Patient is homeless.  Consult TOC to assist with disposition.

## 2023-06-11 NOTE — Assessment & Plan Note (Signed)
 Received oral potassium from the ED

## 2023-06-11 NOTE — Hospital Course (Signed)
 Richard Estes

## 2023-06-12 ENCOUNTER — Other Ambulatory Visit: Payer: Self-pay

## 2023-06-12 ENCOUNTER — Encounter: Payer: Self-pay | Admitting: General Surgery

## 2023-06-12 DIAGNOSIS — T383X6A Underdosing of insulin and oral hypoglycemic [antidiabetic] drugs, initial encounter: Secondary | ICD-10-CM | POA: Diagnosis present

## 2023-06-12 DIAGNOSIS — E1165 Type 2 diabetes mellitus with hyperglycemia: Secondary | ICD-10-CM | POA: Diagnosis present

## 2023-06-12 DIAGNOSIS — F1020 Alcohol dependence, uncomplicated: Secondary | ICD-10-CM | POA: Diagnosis present

## 2023-06-12 DIAGNOSIS — D62 Acute posthemorrhagic anemia: Secondary | ICD-10-CM | POA: Diagnosis present

## 2023-06-12 DIAGNOSIS — Z5902 Unsheltered homelessness: Secondary | ICD-10-CM | POA: Diagnosis not present

## 2023-06-12 DIAGNOSIS — Z7984 Long term (current) use of oral hypoglycemic drugs: Secondary | ICD-10-CM | POA: Diagnosis not present

## 2023-06-12 DIAGNOSIS — F17210 Nicotine dependence, cigarettes, uncomplicated: Secondary | ICD-10-CM | POA: Diagnosis present

## 2023-06-12 DIAGNOSIS — K625 Hemorrhage of anus and rectum: Secondary | ICD-10-CM | POA: Diagnosis present

## 2023-06-12 DIAGNOSIS — Z635 Disruption of family by separation and divorce: Secondary | ICD-10-CM | POA: Diagnosis not present

## 2023-06-12 DIAGNOSIS — K621 Rectal polyp: Secondary | ICD-10-CM | POA: Diagnosis present

## 2023-06-12 DIAGNOSIS — Z5986 Financial insecurity: Secondary | ICD-10-CM | POA: Diagnosis not present

## 2023-06-12 DIAGNOSIS — Z91030 Bee allergy status: Secondary | ICD-10-CM | POA: Diagnosis not present

## 2023-06-12 DIAGNOSIS — K626 Ulcer of anus and rectum: Secondary | ICD-10-CM | POA: Diagnosis present

## 2023-06-12 DIAGNOSIS — K648 Other hemorrhoids: Secondary | ICD-10-CM | POA: Diagnosis present

## 2023-06-12 DIAGNOSIS — Z5948 Other specified lack of adequate food: Secondary | ICD-10-CM | POA: Diagnosis not present

## 2023-06-12 DIAGNOSIS — Z5941 Food insecurity: Secondary | ICD-10-CM | POA: Diagnosis not present

## 2023-06-12 DIAGNOSIS — E876 Hypokalemia: Secondary | ICD-10-CM | POA: Diagnosis present

## 2023-06-12 DIAGNOSIS — I723 Aneurysm of iliac artery: Secondary | ICD-10-CM | POA: Diagnosis present

## 2023-06-12 DIAGNOSIS — Z91148 Patient's other noncompliance with medication regimen for other reason: Secondary | ICD-10-CM | POA: Diagnosis not present

## 2023-06-12 DIAGNOSIS — E871 Hypo-osmolality and hyponatremia: Secondary | ICD-10-CM | POA: Diagnosis present

## 2023-06-12 LAB — CBC
HCT: 24.5 % — ABNORMAL LOW (ref 39.0–52.0)
Hemoglobin: 8.1 g/dL — ABNORMAL LOW (ref 13.0–17.0)
MCH: 30.1 pg (ref 26.0–34.0)
MCHC: 33.1 g/dL (ref 30.0–36.0)
MCV: 91.1 fL (ref 80.0–100.0)
Platelets: 190 10*3/uL (ref 150–400)
RBC: 2.69 MIL/uL — ABNORMAL LOW (ref 4.22–5.81)
RDW: 12.5 % (ref 11.5–15.5)
WBC: 9.1 10*3/uL (ref 4.0–10.5)
nRBC: 0 % (ref 0.0–0.2)

## 2023-06-12 LAB — BASIC METABOLIC PANEL WITH GFR
Anion gap: 6 (ref 5–15)
BUN: 9 mg/dL (ref 8–23)
CO2: 26 mmol/L (ref 22–32)
Calcium: 8.1 mg/dL — ABNORMAL LOW (ref 8.9–10.3)
Chloride: 99 mmol/L (ref 98–111)
Creatinine, Ser: 0.48 mg/dL — ABNORMAL LOW (ref 0.61–1.24)
GFR, Estimated: >60 mL/min (ref >60)
Glucose, Bld: 200 mg/dL — ABNORMAL HIGH (ref 70–99)
Potassium: 4 mmol/L (ref 3.5–5.1)
Sodium: 131 mmol/L — ABNORMAL LOW (ref 135–145)

## 2023-06-12 LAB — PREPARE RBC (CROSSMATCH)

## 2023-06-12 LAB — GLUCOSE, CAPILLARY
Glucose-Capillary: 227 mg/dL — ABNORMAL HIGH (ref 70–99)
Glucose-Capillary: 336 mg/dL — ABNORMAL HIGH (ref 70–99)
Glucose-Capillary: 164 mg/dL — ABNORMAL HIGH (ref 70–99)
Glucose-Capillary: 136 mg/dL — ABNORMAL HIGH (ref 70–99)

## 2023-06-12 LAB — ABO/RH: ABO/RH(D): A POS

## 2023-06-12 LAB — PHOSPHORUS: Phosphorus: 1.9 mg/dL — ABNORMAL LOW (ref 2.5–4.6)

## 2023-06-12 LAB — MAGNESIUM: Magnesium: 1.9 mg/dL (ref 1.7–2.4)

## 2023-06-12 LAB — SURGICAL PATHOLOGY

## 2023-06-12 MED ORDER — SODIUM CHLORIDE 0.9% IV SOLUTION: Freq: Once | INTRAVENOUS | Status: AC

## 2023-06-12 MED ORDER — LANCET DEVICE MISC
1.0000 | Freq: Three times a day (TID) | 0 refills | Status: AC
Start: 2023-06-12 — End: ?
  Filled 2023-06-12: qty 1, 30d supply, fill #0

## 2023-06-12 MED ORDER — METFORMIN HCL 850 MG PO TABS
850.0000 mg | ORAL_TABLET | Freq: Two times a day (BID) | ORAL | 0 refills | Status: DC
  Filled 2023-06-12: qty 60, 30d supply, fill #0

## 2023-06-12 MED ORDER — BLOOD GLUCOSE TEST VI STRP
1.0000 | ORAL_STRIP | Freq: Three times a day (TID) | 0 refills | Status: AC
  Filled 2023-06-12: qty 100, 34d supply, fill #0

## 2023-06-12 MED ORDER — GLIPIZIDE ER 5 MG PO TB24
5.0000 mg | ORAL_TABLET | Freq: Every day | ORAL | Status: DC
  Administered 2023-06-12 – 2023-06-13 (×2): 5 mg via ORAL
  Filled 2023-06-12 (×2): qty 1

## 2023-06-12 MED ORDER — K PHOS MONO-SOD PHOS DI & MONO 155-852-130 MG PO TABS
500.0000 mg | ORAL_TABLET | Freq: Three times a day (TID) | ORAL | Status: AC
  Administered 2023-06-12 (×2): 500 mg via ORAL
  Filled 2023-06-12 (×4): qty 2

## 2023-06-12 MED ORDER — GLIPIZIDE ER 10 MG PO TB24
10.0000 mg | ORAL_TABLET | Freq: Every day | ORAL | 0 refills | Status: DC
  Filled 2023-06-12: qty 30, 30d supply, fill #0

## 2023-06-12 MED ORDER — LANCETS MISC
1.0000 | Freq: Three times a day (TID) | 0 refills | Status: AC
  Filled 2023-06-12: qty 100, 34d supply, fill #0

## 2023-06-12 MED ORDER — BLOOD GLUCOSE MONITOR SYSTEM W/DEVICE KIT
1.0000 | PACK | Freq: Three times a day (TID) | 0 refills | Status: AC
  Filled 2023-06-12: qty 1, 30d supply, fill #0

## 2023-06-12 NOTE — Anesthesia Postprocedure Evaluation (Signed)
 Anesthesia Post Note  Patient: Richard Estes  Procedure(s) Performed: EXAM UNDER ANESTHESIA, RECTUM  Patient location during evaluation: PACU Anesthesia Type: General Level of consciousness: awake and alert Pain management: pain level controlled Vital Signs Assessment: post-procedure vital signs reviewed and stable Respiratory status: spontaneous breathing, nonlabored ventilation, respiratory function stable and patient connected to nasal cannula oxygen Cardiovascular status: blood pressure returned to baseline and stable Postop Assessment: no apparent nausea or vomiting Anesthetic complications: no  No notable events documented.   Last Vitals:  Vitals:   06/11/23 2352 06/12/23 0436  BP: 118/87 112/84  Pulse: 85 81  Resp: 18 18  Temp: 36.8 C 36.9 C  SpO2: 100% 100%    Last Pain:  Vitals:   06/12/23 0600  TempSrc:   PainSc: Asleep                 Enrique Harvest

## 2023-06-12 NOTE — Progress Notes (Signed)
 Patient ID: Richard Estes, male   DOB: January 08, 1961, 63 y.o.   MRN: [id number]     SURGICAL PROGRESS NOTE   Hospital Day(s): 1.   Interval History: Patient seen and examined, no acute events or new complaints overnight. Patient reports feeling weak.  He denies any rectal bleeding.  He denies rectal pain.  Vital signs in last 24 hours: [min-max] current  Temp:  [97.7 F (36.5 C)-99 F (37.2 C)] 97.7 F (36.5 C) (04/25 0827) Pulse Rate:  [81-94] 86 (04/25 0827) Resp:  [18] 18 (04/25 0436) BP: (102-129)/(76-93) 102/76 (04/25 0827) SpO2:  [98 %-100 %] 98 % (04/25 0827)     Height: 5\' 9"  (175.3 cm) Weight: 49.1 kg BMI (Calculated): 15.98   Physical Exam:  Constitutional: alert, cooperative and no distress  Respiratory: breathing non-labored at rest  Cardiovascular: regular rate and sinus rhythm  Gastrointestinal: soft, non-tender, and non-distended Rectal: Not examined adequate rectal tone.  No blood on the dorsal exam.  No active bleeding.  Labs:     Latest Ref Rng & Units 06/12/2023    3:24 AM 06/11/2023    7:43 AM 06/10/2023   11:17 PM  CBC  WBC 4.0 - 10.5 K/uL 9.1  11.6  6.5   Hemoglobin 13.0 - 17.0 g/dL 8.1  8.8  9.8   Hematocrit 39.0 - 52.0 % 24.5  26.1  29.5   Platelets 150 - 400 K/uL 190  216  217       Latest Ref Rng & Units 06/12/2023    3:24 AM 06/11/2023    9:25 AM 06/10/2023    8:18 PM  CMP  Glucose 70 - 99 mg/dL 409  811  914   BUN 8 - 23 mg/dL 9  8  8    Creatinine 0.61 - 1.24 mg/dL 7.82  9.56  2.13   Sodium 135 - 145 mmol/L 131  131  127   Potassium 3.5 - 5.1 mmol/L 4.0  3.0  2.9   Chloride 98 - 111 mmol/L 99  95  88   CO2 22 - 32 mmol/L 26  29  24    Calcium 8.9 - 10.3 mg/dL 8.1  7.8  8.4     Imaging studies: No new pertinent imaging studies   Assessment/Plan:  63 y.o. male with rectal bleeding from ulcer status post ligation of bleeder, complicated by pertinent comorbidities including uncontrolled diabetes, hypokalemia, alcohol dependence.   -Rectal exam  today without any sign of bleeding - Patient tolerating diet - Primary team order transfusion due to patient feeling weak with decreased hemoglobin of 8.1 compared to his baseline of 11. - Again, no sign of more active bleeding.  No further surgical intervention is needed. - May be discharged once medically stable.  Lucila Rye, MD

## 2023-06-12 NOTE — Progress Notes (Addendum)
 Progress Note    Richard Estes  ZOX:[id number] DOB: 1960/12/09  DOA: 06/10/2023 PCP: Pcp, No      Brief Narrative:    Medical records reviewed and are as summarized below:  Richard Estes is a 63 y.o. male with medical history significant for type II DM on metformin  (defaulted treatment because he ran out of metformin ), who presented to the hospital because of hyperglycemia.  Glucose level was 491 on admission.  In the ED, he was noted to have rectal bleeding from an internal hemorrhoid. He was evaluated by the general surgeon and he underwent ligation of bleeding rectal ulcer and rectal polyp biopsy.        Assessment/Plan:   Principal Problem:   Hemorrhoids, internal, with bleeding Active Problems:   ABLA (acute blood loss anemia)   Uncontrolled type 2 diabetes mellitus with hyperglycemia, without long-term current use of insulin  (HCC)   Hypokalemia   Aneurysm of common iliac artery (HCC)   Unhoused person    Body mass index is 15.99 kg/m.   Rectal bleeding secondary internal hemorrhoids: Resolved.  S/p ligation of bleeding rectal ulcer and rectal polyp biopsy.  No further intervention from surgeon.   Acute blood loss anemia, symptomatic: Hemoglobin down from 10.8-8.1.  Patient complains of general weakness and dizziness and he is concerned about "going out to the street".  Transfuse 1 unit of PRBCs and monitor hemoglobin.  Discussed risk, benefits and alternatives to blood transfusion.  He is agreeable to blood transfusion. Folate and vitamin B12 levels were normal Ferritin 629, iron 76, saturation ratio 26.  No evidence of iron deficiency.   Type II DM with hyperglycemia: Continue low-dose Lantus  while inpatient.  Start glipizide  5 mg daily.  NovoLog  as needed for hyperglycemia. Hemoglobin A1c is 14.3.  Glucose is uncontrolled.  Recommended insulin  therapy at discharge.  However, patient does not want insulin .  He prefers to continue with pills to  control blood sugar.  He will continue metformin  at discharge (increased dose from 500 to 850 mg twice daily).  He will also be started on glipizide  Prescription for glucometer and accessories has been provided as well   Hypokalemia: Improving.  Sodium up from 127-131. Hypokalemia: Improved Hypomagnesemia: Improved Hypophosphatemia: Replete with oral potassium phosphate    Medical nonadherence: Discussed the importance of medical adherence.  Discussed potential complications including but not limited to cardiovascular complications, amputations, kidney disease and neuropathy from uncontrolled diabetes.     Diet Order             Diet Carb Modified Fluid consistency: Thin; Room service appropriate? Yes  Diet effective now                            Consultants: General Surgeon  Procedures: ligation of bleeding rectal ulcer and rectal polyp biopsy on 06/11/2023.     Medications:    glipiZIDE   5 mg Oral Q breakfast   insulin  aspart  0-15 Units Subcutaneous TID WC   insulin  aspart  0-5 Units Subcutaneous QHS   insulin  glargine-yfgn  10 Units Subcutaneous Daily   phosphorus  500 mg Oral TID   Continuous Infusions:   Anti-infectives (From admission, onward)    None              Family Communication/Anticipated D/C date and plan/Code Status   DVT prophylaxis: SCD's Start: 06/11/23 0345 SCDs Start: 06/11/23 0344     Code Status: Full Code  Family Communication: None Disposition Plan: Plan to discharge home   Status is: Observation The patient will require care spanning > 2 midnights and should be moved to inpatient because: Symptomatic anemia       Subjective:   Interval events noted.  He complains of feeling very weak and tired.  He is a little dizzy.  He said he is concerned about "going out to the streets" like this  Objective:    Vitals:   06/11/23 2037 06/11/23 2352 06/12/23 0436 06/12/23 0827  BP: (!) 129/93 118/87 112/84  102/76  Pulse: 81 85 81 86  Resp: 18 18 18    Temp: 98.3 F (36.8 C) 98.2 F (36.8 C) 98.4 F (36.9 C) 97.7 F (36.5 C)  TempSrc: Oral Axillary Oral   SpO2: 100% 100% 100% 98%  Weight:      Height:       No data found.   Intake/Output Summary (Last 24 hours) at 06/12/2023 0847 Last data filed at 06/11/2023 2200 Gross per 24 hour  Intake 358 ml  Output --  Net 358 ml   Filed Weights   06/10/23 2014 06/11/23 0606  Weight: 68 kg 49.1 kg    Exam:  GEN: NAD SKIN: Warm and dry EYES: Pale, anicteric ENT: MMM CV: RRR PULM: CTA B ABD: soft, ND, NT, +BS CNS: AAO x 3, non focal EXT: No edema or tenderness        Data Reviewed:   I have personally reviewed following labs and imaging studies:  Labs: Labs show the following:   Basic Metabolic Panel: Recent Labs  Lab 06/10/23 2018 06/11/23 0925 06/12/23 0324  NA 127* 131* 131*  K 2.9* 3.0* 4.0  CL 88* 95* 99  CO2 24 29 26   GLUCOSE 473* 272* 200*  BUN 8 8 9   CREATININE 0.60* 0.63 0.48*  CALCIUM 8.4* 7.8* 8.1*  MG  --  1.6* 1.9  PHOS  --  2.3* 1.9*   GFR Estimated Creatinine Clearance: 66.5 mL/min (A) (by C-G formula based on SCr of 0.48 mg/dL (L)). Liver Function Tests: No results for input(s): "AST", "ALT", "ALKPHOS", "BILITOT", "PROT", "ALBUMIN" in the last 168 hours. No results for input(s): "LIPASE", "AMYLASE" in the last 168 hours. No results for input(s): "AMMONIA" in the last 168 hours. Coagulation profile Recent Labs  Lab 06/10/23 2317  INR 1.2    CBC: Recent Labs  Lab 06/10/23 2018 06/10/23 2317 06/11/23 0743 06/12/23 0324  WBC 6.8 6.5 11.6* 9.1  NEUTROABS  --  2.9  --   --   HGB 10.8* 9.8* 8.8* 8.1*  HCT 31.8* 29.5* 26.1* 24.5*  MCV 90.9 93.4 90.3 91.1  PLT 249 217 216 190   Cardiac Enzymes: No results for input(s): "CKTOTAL", "CKMB", "CKMBINDEX", "TROPONINI" in the last 168 hours. BNP (last 3 results) No results for input(s): "PROBNP" in the last 8760 hours. CBG: Recent Labs   Lab 06/11/23 1141 06/11/23 1640 06/11/23 2042 06/11/23 2246 06/12/23 0828  GLUCAP 255* 166* 224* 167* 227*   D-Dimer: No results for input(s): "DDIMER" in the last 72 hours. Hgb A1c: Recent Labs    06/11/23 0743  HGBA1C 14.3*   Lipid Profile: No results for input(s): "CHOL", "HDL", "LDLCALC", "TRIG", "CHOLHDL", "LDLDIRECT" in the last 72 hours. Thyroid function studies: No results for input(s): "TSH", "T4TOTAL", "T3FREE", "THYROIDAB" in the last 72 hours.  Invalid input(s): "FREET3" Anemia work up: Recent Labs    06/11/23 0925  VITAMINB12 447  FOLATE 7.4  FERRITIN 629*  TIBC 298  IRON 76   Sepsis Labs: Recent Labs  Lab 06/10/23 2018 06/10/23 2317 06/11/23 0743 06/12/23 0324  WBC 6.8 6.5 11.6* 9.1    Microbiology No results found for this or any previous visit (from the past 240 hours).  Procedures and diagnostic studies:  CT ANGIO GI BLEED Result Date: 06/10/2023 CLINICAL DATA:  Rectal bleeding. EXAM: CTA ABDOMEN AND PELVIS WITHOUT AND WITH CONTRAST TECHNIQUE: Multidetector CT imaging of the abdomen and pelvis was performed using the standard protocol during bolus administration of intravenous contrast. Multiplanar reconstructed images and MIPs were obtained and reviewed to evaluate the vascular anatomy. RADIATION DOSE REDUCTION: This exam was performed according to the departmental dose-optimization program which includes automated exposure control, adjustment of the mA and/or kV according to patient size and/or use of iterative reconstruction technique. CONTRAST:  OMNIPAQUE  IOHEXOL  350 MG/ML SOLN COMPARISON:  None Available. FINDINGS: VASCULAR Aorta: Normal caliber aorta without aneurysm, dissection, vasculitis or significant stenosis. There is mild calcified atherosclerotic disease throughout the aorta. Celiac: Patent without evidence of aneurysm, dissection, vasculitis or significant stenosis. SMA: Patent without evidence of aneurysm, dissection, vasculitis  or significant stenosis. Renals: Both renal arteries are patent without evidence of aneurysm, dissection, vasculitis, fibromuscular dysplasia or significant stenosis. There is calcified atherosclerotic disease present. IMA: Patent without evidence of aneurysm, dissection, vasculitis or significant stenosis. Inflow: There is aneurysmal dilatation of the right common iliac artery measuring 2.3 cm. No dissection. There is calcified atherosclerotic disease throughout. No significant stenosis. Proximal Outflow: Bilateral common femoral and visualized portions of the superficial and profunda femoral arteries are patent without evidence of aneurysm, dissection, vasculitis or significant stenosis. Veins: No obvious venous abnormality within the limitations of this arterial phase study. Review of the MIP images confirms the above findings. NON-VASCULAR Lower chest: No acute abnormality. Hepatobiliary: No focal liver abnormality is seen. No gallstones, gallbladder wall thickening, or biliary dilatation. Pancreas: Unremarkable. No pancreatic ductal dilatation or surrounding inflammatory changes. Spleen: Normal in size without focal abnormality. Adrenals/Urinary Tract: Adrenal glands are unremarkable. Kidneys are normal, without renal calculi, focal lesion, or hydronephrosis. Bladder is unremarkable. Stomach/Bowel: There is active gastrointestinal bleeding within the distal rectum approximately 3.5 cm proximal to the anise. There is some distal rectal wall thickening. No dilated bowel loops are seen. The appendix is not visualized. Lymphatic: No enlarged lymph nodes are seen. Reproductive: Prostate gland is within normal limits. Other: There is a small amount of free fluid throughout the abdomen and pelvis. There is mild body wall edema. There is no focal abdominal wall hernia. Musculoskeletal: Degenerative changes affect the spine. IMPRESSION: 1. Active gastrointestinal bleeding within the distal rectum. 2. 2.3 cm right common  iliac artery aneurysm. 3. Rectal wall thickening worrisome for proctitis. 4. Small amount of ascites. 5. Body wall edema. Aortic Atherosclerosis (ICD10-I70.0). These results were called by telephone at the time of interpretation on 06/10/2023 at 11:46 pm to provider DAVID [place] , who verbally acknowledged these results. Electronically Signed   By: Tyron Gallon M.D.   On: 06/10/2023 23:48               LOS: 1 day   Quint Chestnut  [facility]   Pager on [email or web address]. If 7PM-7AM, please contact night-coverage at [email or web address]     06/12/2023, 8:47 AM

## 2023-06-12 NOTE — Inpatient Diabetes Management (Signed)
 Inpatient Diabetes Program Recommendations  AACE/ADA: New Consensus Statement on Inpatient Glycemic Control (2015)  Target Ranges:  Prepandial:   less than 140 mg/dL      Peak postprandial:   less than 180 mg/dL (1-2 hours)      Critically ill patients:  140 - 180 mg/dL   Lab Results  Component Value Date   GLUCAP 227 (H) 06/12/2023   HGBA1C 14.3 (H) 06/11/2023    Review of Glycemic Control  Latest Reference Range & Units 06/11/23 16:40 06/11/23 20:42 06/11/23 22:46 06/12/23 08:28  Glucose-Capillary 70 - 99 mg/dL 454 (H) 098 (H) 119 (H) 227 (H)   Diabetes history: DM 2 Outpatient Diabetes medications: Metformin  - not taking Current orders for Inpatient glycemic control:  Novolog  0-15 units tid with meals and HS Semglee  10 units daily  Inpatient Diabetes Program Recommendations:   Blood sugars improved.  Will need DM oral medications at discharge.  Currently patient is homeless and no where to store insulin .   He also will need meter.  Sent secure message to [facility] RN as well as patient needs to establish care [facility] for continued follow up of DM.    Thanks,  Josefa Ni, RN, BC-ADM Inpatient Diabetes Coordinator Pager [phone]  (8a-5p)

## 2023-06-13 LAB — RENAL FUNCTION PANEL
Albumin: 2.7 g/dL — ABNORMAL LOW (ref 3.5–5.0)
Anion gap: 8 (ref 5–15)
BUN: 9 mg/dL (ref 8–23)
CO2: 26 mmol/L (ref 22–32)
Calcium: 8.2 mg/dL — ABNORMAL LOW (ref 8.9–10.3)
Chloride: 100 mmol/L (ref 98–111)
Creatinine, Ser: 0.48 mg/dL — ABNORMAL LOW (ref 0.61–1.24)
GFR, Estimated: >60 mL/min (ref >60)
Glucose, Bld: 212 mg/dL — ABNORMAL HIGH (ref 70–99)
Phosphorus: 3.4 mg/dL (ref 2.5–4.6)
Potassium: 3.9 mmol/L (ref 3.5–5.1)
Sodium: 134 mmol/L — ABNORMAL LOW (ref 135–145)

## 2023-06-13 LAB — CBC
HCT: 28.7 % — ABNORMAL LOW (ref 39.0–52.0)
Hemoglobin: 10 g/dL — ABNORMAL LOW (ref 13.0–17.0)
MCH: 30.7 pg (ref 26.0–34.0)
MCHC: 34.8 g/dL (ref 30.0–36.0)
MCV: 88 fL (ref 80.0–100.0)
Platelets: 185 10*3/uL (ref 150–400)
RBC: 3.26 MIL/uL — ABNORMAL LOW (ref 4.22–5.81)
RDW: 13.2 % (ref 11.5–15.5)
WBC: 9.7 10*3/uL (ref 4.0–10.5)
nRBC: 0 % (ref 0.0–0.2)

## 2023-06-13 LAB — TYPE AND SCREEN
ABO/RH(D): A POS
Antibody Screen: NEGATIVE
Unit division: 0

## 2023-06-13 LAB — BPAM RBC
Blood Product Expiration Date: 202505262359
ISSUE DATE / TIME: 202504251118
Unit Type and Rh: 6200

## 2023-06-13 LAB — GLUCOSE, CAPILLARY
Glucose-Capillary: 224 mg/dL — ABNORMAL HIGH (ref 70–99)
Glucose-Capillary: 278 mg/dL — ABNORMAL HIGH (ref 70–99)

## 2023-06-13 NOTE — Plan of Care (Signed)

## 2023-06-13 NOTE — Discharge Summary (Signed)
 Physician Discharge Summary   Patient: Richard Estes MRN: [id number] DOB: 1960-07-11  Admit date:     06/10/2023  Discharge date: 06/13/23  Discharge Physician: Sheril Dines   PCP: Pcp, No   Recommendations at discharge:  {Tip this will not be part of the note when signed- Example include specific recommendations for outpatient follow-up, pending tests to follow-up on. (Optional):26781}  ***  Discharge Diagnoses: Principal Problem:   Hemorrhoids, internal, with bleeding Active Problems:   ABLA (acute blood loss anemia)   Uncontrolled type 2 diabetes mellitus with hyperglycemia, without long-term current use of insulin  (HCC)   Hypokalemia   Aneurysm of common iliac artery (HCC)   Unhoused person   Rectal bleeding  Resolved Problems:   * No resolved hospital problems. [facility] Course: Diabetes, with frequent ED visits for symptomatic hyperglycemia, being admitted for rectal bleeding that developed while being evaluated in the ED for hyperglycemia and hypokalemia.  He is being taken emergently to the OR and medicine admission requested ED course and data review:  Assessment and Plan: * Hemorrhoids, internal, with bleeding Acute blood loss anemia CTA showing acute rectal bleeding Patient being taken emergently to the OR Monitor hemoglobin and transfuse if needed Surgical consult to follow  Uncontrolled type 2 diabetes mellitus with hyperglycemia, without long-term current use of insulin  (HCC) Sliding scale insulin  coverage  Hypokalemia Received oral potassium from the ED  Aneurysm of common iliac artery (HCC) Will need vascular surveillance  Unhoused person TOC consult      {Tip this will not be part of the note when signed Body mass index is 15.99 kg/m. , ,  (Optional):26781}  {(NOTE) Pain control PDMP Statment (Optional):26782} Consultants: *** Procedures performed: ***  Disposition: {Plan; Disposition:26390} Diet recommendation:  Discharge Diet  Orders (From admission, onward)     Start     Ordered   06/13/23 0000  Diet - low sodium heart healthy        06/13/23 0833   06/13/23 0000  Diet Carb Modified        06/13/23 0833           {Diet_Plan:26776} DISCHARGE MEDICATION: Allergies as of 06/13/2023       Reactions   Bee Venom         Medication List     TAKE these medications    albuterol  108 (90 Base) MCG/ACT inhaler Commonly known as: VENTOLIN  HFA Inhale 2 puffs into the lungs every 6 (six) hours as needed for shortness of breath or wheezing.   EPINEPHrine  0.3 mg/0.3 mL Soaj injection Commonly known as: EPI-PEN Inject 0.3 mg into the muscle as needed for anaphylaxis.   glipiZIDE  10 MG 24 hr tablet Commonly known as: GLUCOTROL  XL Take 1 tablet (10 mg total) by mouth daily with breakfast.   metFORMIN  850 MG tablet Commonly known as: GLUCOPHAGE  Take 1 tablet (850 mg total) by mouth 2 (two) times daily with a meal. What changed:  medication strength how much to take   True Metrix Blood Glucose Test test strip Generic drug: glucose blood Use as directed to check blood sugar 4 (four) times daily. What changed: Another medication with the same name was added. Make sure you understand how and when to take each.   True Metrix Blood Glucose Test test strip Generic drug: glucose blood Use 3 (three) times daily. Use as directed to check blood sugar. What changed: You were already taking a medication with the same name, and this prescription was added. Make  sure you understand how and when to take each.   True Metrix Meter w/Device Kit use to check blood glucose What changed: Another medication with the same name was added. Make sure you understand how and when to take each.   True Metrix Meter w/Device Kit Use 3 (three) times daily to check blood sugar What changed: You were already taking a medication with the same name, and this prescription was added. Make sure you understand how and when to take  each.   TRUEdraw Lancing Device Misc Use 3 (three) times daily to check blood sugar   TRUEplus Lancets 33G Misc Use as directed to check blood sugar up 4 (four) times daily. What changed: Another medication with the same name was added. Make sure you understand how and when to take each.   TRUEplus Lancets 33G Misc Use 3 (three) times daily. Use as directed to check blood sugar. What changed: You were already taking a medication with the same name, and this prescription was added. Make sure you understand how and when to take each.        Discharge Exam: Filed Weights   06/10/23 2014 06/11/23 0606  Weight: 68 kg 49.1 kg   ***  Condition at discharge: {DC Condition:26389}  The results of significant diagnostics from this hospitalization (including imaging, microbiology, ancillary and laboratory) are listed below for reference.   Imaging Studies: CT ANGIO GI BLEED Result Date: 06/10/2023 CLINICAL DATA:  Rectal bleeding. EXAM: CTA ABDOMEN AND PELVIS WITHOUT AND WITH CONTRAST TECHNIQUE: Multidetector CT imaging of the abdomen and pelvis was performed using the standard protocol during bolus administration of intravenous contrast. Multiplanar reconstructed images and MIPs were obtained and reviewed to evaluate the vascular anatomy. RADIATION DOSE REDUCTION: This exam was performed according to the departmental dose-optimization program which includes automated exposure control, adjustment of the mA and/or kV according to patient size and/or use of iterative reconstruction technique. CONTRAST:  OMNIPAQUE  IOHEXOL  350 MG/ML SOLN COMPARISON:  None Available. FINDINGS: VASCULAR Aorta: Normal caliber aorta without aneurysm, dissection, vasculitis or significant stenosis. There is mild calcified atherosclerotic disease throughout the aorta. Celiac: Patent without evidence of aneurysm, dissection, vasculitis or significant stenosis. SMA: Patent without evidence of aneurysm, dissection,  vasculitis or significant stenosis. Renals: Both renal arteries are patent without evidence of aneurysm, dissection, vasculitis, fibromuscular dysplasia or significant stenosis. There is calcified atherosclerotic disease present. IMA: Patent without evidence of aneurysm, dissection, vasculitis or significant stenosis. Inflow: There is aneurysmal dilatation of the right common iliac artery measuring 2.3 cm. No dissection. There is calcified atherosclerotic disease throughout. No significant stenosis. Proximal Outflow: Bilateral common femoral and visualized portions of the superficial and profunda femoral arteries are patent without evidence of aneurysm, dissection, vasculitis or significant stenosis. Veins: No obvious venous abnormality within the limitations of this arterial phase study. Review of the MIP images confirms the above findings. NON-VASCULAR Lower chest: No acute abnormality. Hepatobiliary: No focal liver abnormality is seen. No gallstones, gallbladder wall thickening, or biliary dilatation. Pancreas: Unremarkable. No pancreatic ductal dilatation or surrounding inflammatory changes. Spleen: Normal in size without focal abnormality. Adrenals/Urinary Tract: Adrenal glands are unremarkable. Kidneys are normal, without renal calculi, focal lesion, or hydronephrosis. Bladder is unremarkable. Stomach/Bowel: There is active gastrointestinal bleeding within the distal rectum approximately 3.5 cm proximal to the anise. There is some distal rectal wall thickening. No dilated bowel loops are seen. The appendix is not visualized. Lymphatic: No enlarged lymph nodes are seen. Reproductive: Prostate gland is within normal limits. Other:  There is a small amount of free fluid throughout the abdomen and pelvis. There is mild body wall edema. There is no focal abdominal wall hernia. Musculoskeletal: Degenerative changes affect the spine. IMPRESSION: 1. Active gastrointestinal bleeding within the distal rectum. 2. 2.3 cm  right common iliac artery aneurysm. 3. Rectal wall thickening worrisome for proctitis. 4. Small amount of ascites. 5. Body wall edema. Aortic Atherosclerosis (ICD10-I70.0). These results were called by telephone at the time of interpretation on 06/10/2023 at 11:46 pm to provider DAVID [facility] , who verbally acknowledged these results. Electronically Signed   By: Tyron Gallon M.D.   On: 06/10/2023 23:48    Microbiology: Results for orders placed or performed during the hospital encounter of 03/18/23  Resp panel by RT-PCR (RSV, Flu A&B, Covid) Anterior Nasal Swab     Status: None   Collection Time: 03/18/23  7:14 PM   Specimen: Anterior Nasal Swab  Result Value Ref Range Status   SARS Coronavirus 2 by RT PCR NEGATIVE NEGATIVE Final    Comment: (NOTE) SARS-CoV-2 target nucleic acids are NOT DETECTED.  The SARS-CoV-2 RNA is generally detectable in upper respiratory specimens during the acute phase of infection. The lowest concentration of SARS-CoV-2 viral copies this assay can detect is 138 copies/mL. A negative result does not preclude SARS-Cov-2 infection and should not be used as the sole basis for treatment or other patient management decisions. A negative result may occur with  improper specimen collection/handling, submission of specimen other than nasopharyngeal swab, presence of viral mutation(s) within the areas targeted by this assay, and inadequate number of viral copies(<138 copies/mL). A negative result must be combined with clinical observations, patient history, and epidemiological information. The expected result is Negative.  Fact Sheet for Patients:  BloggerCourse.com  Fact Sheet for Healthcare Providers:  SeriousBroker.it  This test is no t yet approved or cleared by the [place]  FDA and  has been authorized for detection and/or diagnosis of SARS-CoV-2 by FDA under an Emergency Use Authorization (EUA). This EUA will  remain  in effect (meaning this test can be used) for the duration of the COVID-19 declaration under Section 564(b)(1) of the Act, 21 U.S.C.section 360bbb-3(b)(1), unless the authorization is terminated  or revoked sooner.       Influenza A by PCR NEGATIVE NEGATIVE Final   Influenza B by PCR NEGATIVE NEGATIVE Final    Comment: (NOTE) The Xpert Xpress SARS-CoV-2/FLU/RSV plus assay is intended as an aid in the diagnosis of influenza from Nasopharyngeal swab specimens and should not be used as a sole basis for treatment. Nasal washings and aspirates are unacceptable for Xpert Xpress SARS-CoV-2/FLU/RSV testing.  Fact Sheet for Patients: BloggerCourse.com  Fact Sheet for Healthcare Providers: SeriousBroker.it  This test is not yet approved or cleared by the [place]  FDA and has been authorized for detection and/or diagnosis of SARS-CoV-2 by FDA under an Emergency Use Authorization (EUA). This EUA will remain in effect (meaning this test can be used) for the duration of the COVID-19 declaration under Section 564(b)(1) of the Act, 21 U.S.C. section 360bbb-3(b)(1), unless the authorization is terminated or revoked.     Resp Syncytial Virus by PCR NEGATIVE NEGATIVE Final    Comment: (NOTE) Fact Sheet for Patients: BloggerCourse.com  Fact Sheet for Healthcare Providers: SeriousBroker.it  This test is not yet approved or cleared by the [place]  FDA and has been authorized for detection and/or diagnosis of SARS-CoV-2 by FDA under an Emergency Use Authorization (EUA). This EUA will remain in effect (  meaning this test can be used) for the duration of the COVID-19 declaration under Section 564(b)(1) of the Act, 21 U.S.C. section 360bbb-3(b)(1), unless the authorization is terminated or revoked.  Performed at [facility], [address] [place], [place] [address]      Labs: CBC: Recent Labs  Lab 06/10/23 2018 06/10/23 2317 06/11/23 0743 06/12/23 0324 06/13/23 0443  WBC 6.8 6.5 11.6* 9.1 9.7  NEUTROABS  --  2.9  --   --   --   HGB 10.8* 9.8* 8.8* 8.1* 10.0*  HCT 31.8* 29.5* 26.1* 24.5* 28.7*  MCV 90.9 93.4 90.3 91.1 88.0  PLT 249 217 216 190 185   Basic Metabolic Panel: Recent Labs  Lab 06/10/23 2018 06/11/23 0925 06/12/23 0324 06/13/23 0443  NA 127* 131* 131* 134*  K 2.9* 3.0* 4.0 3.9  CL 88* 95* 99 100  CO2 24 29 26 26   GLUCOSE 473* 272* 200* 212*  BUN 8 8 9 9   CREATININE 0.60* 0.63 0.48* 0.48*  CALCIUM 8.4* 7.8* 8.1* 8.2*  MG  --  1.6* 1.9  --   PHOS  --  2.3* 1.9* 3.4   Liver Function Tests: Recent Labs  Lab 06/13/23 0443  ALBUMIN 2.7*   CBG: Recent Labs  Lab 06/12/23 0828 06/12/23 1204 06/12/23 1600 06/12/23 2016 06/13/23 0741  GLUCAP 227* 336* 164* 136* 224*    Discharge time spent: {LESS THAN/GREATER THAN:26388} 30 minutes.  Signed: Sheril Dines, MD [facility] 06/13/2023

## 2023-06-13 NOTE — TOC Transition Note (Signed)
 Transition of Care [facility]) - Discharge Note   Patient Details  Name: Richard Estes MRN: [id number] Date of Birth: September 22, 1960  Transition of Care [facility]) CM/SW Contact:  Areta Beer, RN Phone Number: 06/13/2023, 10:49 AM   Clinical Narrative: 4/26: Patient has discharge orders in for today. Transportation resources added to AVS though patient stated would take bus on discharge on prior CM assessment. Patient was given SDOH alert resources and shelter resources but is choosing to return to streets. Does not have a PCP, was referred to financial navigator to confirm insurance, and potentials per initial CM assessment note. [facility] information given. No active contact phone number. Brother listed as emergency contact.   Katheryn Pandy MSN RN CM  RN Case Manager [place]  Transitions of Care Direct Dial: [phone] (Weekends Only) [facility] Main Office Phone: [phone] [facility] Fax: [phone] [email or web address]    Final next level of care: Other (comment) (Patient homeless, will discharge back to where he came from.) Barriers to Discharge: Barriers Resolved   Patient Goals and CMS Choice            Discharge Placement                       Discharge Plan and Services Additional resources added to the After Visit Summary for       Post Acute Care Choice: NA          DME Arranged: N/A DME Agency: NA       HH Arranged: NA HH Agency: NA        Social Drivers of Health (SDOH) Interventions SDOH Screenings   Food Insecurity: Food Insecurity Present (10/08/2022)  Housing: Medium Risk (10/08/2022)  Transportation Needs: No Transportation Needs (10/08/2022)  Utilities: Not At Risk (10/08/2022)  Alcohol Screen: High Risk (10/08/2022)  Depression (PHQ2-9): Low Risk  (10/08/2022)  Financial Resource Strain: High Risk (10/08/2022)  Physical Activity: Inactive (10/08/2022)  Social Connections: Moderately Isolated (10/08/2022)  Stress: No Stress Concern Present  (10/08/2022)  Tobacco Use: High Risk (06/10/2023)  Health Literacy: Adequate Health Literacy (10/08/2022)     Readmission Risk Interventions     No data to display

## 2023-06-15 ENCOUNTER — Other Ambulatory Visit: Payer: Self-pay

## 2023-06-16 NOTE — Congregational Nurse Program (Signed)
  Dept: [phone]   Congregational Nurse Program Note  Date of Encounter: 06/16/2023 Client to [facility] day center with request for assistance with [facility] an [facility] and assistance with a medicaid application. Client was recently hospitalized, did receive his medication at discharge. He is currently prescribed metformin  and glipizide . He voiced understanding of when to take them, his is not checking his blood sugar. He did receive a glucometer at discharge and did have it with him, he declined to have his blood sugar checked. Client does plan to return to [facility] tomorrow, RN will continue to provide education regarding the need for blood sugar monitoring. Juvenal Opoka BSN, RN Past Medical History: Past Medical History:  Diagnosis Date   Alcohol abuse    Diabetes mellitus without complication [facility])     Encounter Details:  Community Questionnaire - 06/16/23 1130       Questionnaire   Ask client: Do you give verbal consent for me to treat you today? Yes    Student Assistance N/A    Location Patient Event organiser    Encounter Setting CN site    Population Status Unhoused    Insurance Uninsured ([facility]/Care Connects/Self-Pay/Medicaid Family Planning)   BS/BC through the exchange   Insurance/Financial Assistance Referral Medicaid   RN to assist with Medicaid application.   Medication Patient Medications Reviewed    Medical Provider Yes   [facility]   Screening Referrals Made N/A    Medical Referrals Made [facility] PCP/Clinic   [facility] call placed for [facility]   Medical Appointment Completed N/A    CNP Interventions Educate;Advocate/Support;Navigate Healthcare System;Case Management    Screenings CN Performed N/A    ED Visit Averted N/A    Life-Saving Intervention Made N/A

## 2023-06-18 ENCOUNTER — Other Ambulatory Visit: Payer: Self-pay

## 2023-06-18 ENCOUNTER — Ambulatory Visit: Payer: Self-pay | Admitting: Gerontology

## 2023-06-18 ENCOUNTER — Encounter: Payer: Self-pay | Admitting: Gerontology

## 2023-06-18 VITALS — BP 108/71 | HR 96 | Temp 98.3°F | Resp 18 | Wt 126.1 lb

## 2023-06-18 DIAGNOSIS — Z91030 Bee allergy status: Secondary | ICD-10-CM

## 2023-06-18 DIAGNOSIS — Z7689 Persons encountering health services in other specified circumstances: Secondary | ICD-10-CM

## 2023-06-18 DIAGNOSIS — E119 Type 2 diabetes mellitus without complications: Secondary | ICD-10-CM

## 2023-06-18 DIAGNOSIS — F1010 Alcohol abuse, uncomplicated: Secondary | ICD-10-CM

## 2023-06-18 LAB — GLUCOSE, POCT (MANUAL RESULT ENTRY)
POC Glucose: 58 mg/dL — AB (ref 70–99)
POC Glucose: 90 mg/dL (ref 70–99)

## 2023-06-18 MED ORDER — METFORMIN HCL 850 MG PO TABS
850.0000 mg | ORAL_TABLET | Freq: Two times a day (BID) | ORAL | 0 refills | Status: DC
  Filled 2023-06-18: qty 60, 30d supply, fill #0

## 2023-06-18 MED ORDER — GLIPIZIDE ER 10 MG PO TB24
10.0000 mg | ORAL_TABLET | Freq: Every day | ORAL | 0 refills | Status: DC
  Filled 2023-06-18: qty 30, 30d supply, fill #0

## 2023-06-18 MED ORDER — EPINEPHRINE 0.3 MG/0.3ML IJ SOAJ
0.3000 mg | INTRAMUSCULAR | 5 refills | Status: AC | PRN
Start: 2023-06-18 — End: ?
  Filled 2023-06-18: qty 2, fill #0

## 2023-06-18 NOTE — Patient Instructions (Signed)
 Diabetes Mellitus and Nutrition, Adult When you have diabetes, or diabetes mellitus, it is very important to have healthy eating habits because your blood sugar (glucose) levels are greatly affected by what you eat and drink. Eating healthy foods in the right amounts, at about the same times every day, can help you: Manage your blood glucose. Lower your risk of heart disease. Improve your blood pressure. Reach or maintain a healthy weight. What can affect my meal plan? Every person with diabetes is different, and each person has different needs for a meal plan. Your health care provider may recommend that you work with a dietitian to make a meal plan that is best for you. Your meal plan may vary depending on factors such as: The calories you need. The medicines you take. Your weight. Your blood glucose, blood pressure, and cholesterol levels. Your activity level. Other health conditions you have, such as heart or kidney disease. How do carbohydrates affect me? Carbohydrates, also called carbs, affect your blood glucose level more than any other type of food. Eating carbs raises the amount of glucose in your blood. It is important to know how many carbs you can safely have in each meal. This is different for every person. Your dietitian can help you calculate how many carbs you should have at each meal and for each snack. How does alcohol affect me? Alcohol can cause a decrease in blood glucose (hypoglycemia), especially if you use insulin  or take certain diabetes medicines by mouth. Hypoglycemia can be a life-threatening condition. Symptoms of hypoglycemia, such as sleepiness, dizziness, and confusion, are similar to symptoms of having too much alcohol. Do not drink alcohol if: Your health care provider tells you not to drink. You are pregnant, may be pregnant, or are planning to become pregnant. If you drink alcohol: Limit how much you have to: 0-1 drink a day for women. 0-2 drinks a day  for men. Know how much alcohol is in your drink. In the [place] one drink equals one 12 oz bottle of beer (355 mL), one 5 oz glass of wine (148 mL), or one 1 oz glass of hard liquor (44 mL). Keep yourself hydrated with water, diet soda, or unsweetened iced tea. Keep in mind that regular soda, juice, and other mixers may contain a lot of sugar and must be counted as carbs. What are tips for following this plan?  Reading food labels Start by checking the serving size on the Nutrition Facts label of packaged foods and drinks. The number of calories and the amount of carbs, fats, and other nutrients listed on the label are based on one serving of the item. Many items contain more than one serving per package. Check the total grams (g) of carbs in one serving. Check the number of grams of saturated fats and trans fats in one serving. Choose foods that have a low amount or none of these fats. Check the number of milligrams (mg) of salt (sodium) in one serving. Most people should limit total sodium intake to less than 2,300 mg per day. Always check the nutrition information of foods labeled as "low-fat" or "nonfat." These foods may be higher in added sugar or refined carbs and should be avoided. Talk to your dietitian to identify your daily goals for nutrients listed on the label. Shopping Avoid buying canned, pre-made, or processed foods. These foods tend to be high in fat, sodium, and added sugar. Shop around the outside edge of the grocery store. This is where you  will most often find fresh fruits and vegetables, bulk grains, fresh meats, and fresh dairy products. Cooking Use low-heat cooking methods, such as baking, instead of high-heat cooking methods, such as deep frying. Cook using healthy oils, such as olive, canola, or sunflower oil. Avoid cooking with butter, cream, or high-fat meats. Meal planning Eat meals and snacks regularly, preferably at the same times every day. Avoid going long periods  of time without eating. Eat foods that are high in fiber, such as fresh fruits, vegetables, beans, and whole grains. Eat 4-6 oz (112-168 g) of lean protein each day, such as lean meat, chicken, fish, eggs, or tofu. One ounce (oz) (28 g) of lean protein is equal to: 1 oz (28 g) of meat, chicken, or fish. 1 egg.  cup (62 g) of tofu. Eat some foods each day that contain healthy fats, such as avocado, nuts, seeds, and fish. What foods should I eat? Fruits Berries. Apples. Oranges. Peaches. Apricots. Plums. Grapes. Mangoes. Papayas. Pomegranates. Kiwi. Cherries. Vegetables Leafy greens, including lettuce, spinach, kale, chard, collard greens, mustard greens, and cabbage. Beets. Cauliflower. Broccoli. Carrots. Green beans. Tomatoes. Peppers. Onions. Cucumbers. Brussels sprouts. Grains Whole grains, such as whole-wheat or whole-grain bread, crackers, tortillas, cereal, and pasta. Unsweetened oatmeal. Quinoa. Brown or wild rice. Meats and other proteins Seafood. Poultry without skin. Lean cuts of poultry and beef. Tofu. Nuts. Seeds. Dairy Low-fat or fat-free dairy products such as milk, yogurt, and cheese. The items listed above may not be a complete list of foods and beverages you can eat and drink. Contact a dietitian for more information. What foods should I avoid? Fruits Fruits canned with syrup. Vegetables Canned vegetables. Frozen vegetables with butter or cream sauce. Grains Refined white flour and flour products such as bread, pasta, snack foods, and cereals. Avoid all processed foods. Meats and other proteins Fatty cuts of meat. Poultry with skin. Breaded or fried meats. Processed meat. Avoid saturated fats. Dairy Full-fat yogurt, cheese, or milk. Beverages Sweetened drinks, such as soda or iced tea. The items listed above may not be a complete list of foods and beverages you should avoid. Contact a dietitian for more information. Questions to ask a health care provider Do I need  to meet with a certified diabetes care and education specialist? Do I need to meet with a dietitian? What number can I call if I have questions? When are the best times to check my blood glucose? Where to find more information: American Diabetes Association: diabetes.org Academy of Nutrition and Dietetics: eatright.[employer] of Diabetes and Digestive and Kidney Diseases: StageSync.si Association of Diabetes Care & Education Specialists: diabeteseducator.org Summary It is important to have healthy eating habits because your blood sugar (glucose) levels are greatly affected by what you eat and drink. It is important to use alcohol carefully. A healthy meal plan will help you manage your blood glucose and lower your risk of heart disease. Your health care provider may recommend that you work with a dietitian to make a meal plan that is best for you. This information is not intended to replace advice given to you by your health care provider. Make sure you discuss any questions you have with your health care provider. Document Revised: 09/06/2019 Document Reviewed: 09/07/2019 Elsevier Patient Education  2024 Elsevier Inc.Diabetes Action Plan A diabetes action plan is a way for you to manage your symptoms of diabetes, also called diabetes mellitus. The plan is color-coded to guide you on what actions to take based on any  symptoms you're having. If you have symptoms in the red zone, you need medical care right away. If you have symptoms in the yellow zone, your diabetes isn't under control, and you may need to make some changes. If you have symptoms in the green zone, you're doing well. Understanding diabetes can take time. Follow the treatment plan that you created with your health care provider. Know the target range for your blood sugar, also called glucose. Review your plan each time you visit your provider. The target range for my blood sugar level is __________________________  mg/dL. Red zone Get medical help right away if you have any of the following symptoms: A blood sugar test result that's below 54 mg/dL (3 mmol/L). A blood sugar test result that's at or above 240 mg/dL (32.4 mmol/L) for 2 days in a row along with: Extreme thirst and frequent peeing. Confusion or trouble thinking clearly. Moderate or large ketone levels in your pee (urine). Feeling tired or having no energy. Trouble breathing. Sickness or a fever for 2 or more days that's not getting better. These symptoms may be an emergency. Call 911 right away. Do not wait to see if the symptoms will go away. Do not drive yourself to the hospital. If you have very low blood sugar, also called severe hypoglycemia, and you can't eat or drink, you may need glucagon. Make sure a family member or close friend knows how to check your blood sugar and how to give you glucagon. You may need to be treated in a hospital for this condition. Yellow zone If you have any of the following symptoms, your diabetes isn't under control, and you may need to make some changes: A blood sugar test result that's at or above 240 mg/dL (40.1 mmol/L) for 2 days in a row. Blood sugar test results that are below 70 mg/dL (3.9 mmol/L). Other symptoms of hypoglycemia, such as: Shaking or feeling light-headed. Confusion or irritability. Feeling hungry. Having a fast heartbeat. If you have any yellow zone symptoms: Treat your hypoglycemia by eating or drinking 15 grams of a rapid-acting carbohydrate. Follow the 15:15 rule: Take 15 grams of a rapid-acting carbohydrate, such as: 1 tube of glucose gel. 4 glucose pills. 4 oz (120 mL) of fruit juice. 4 oz (120 mL) of regular (not diet) soda. Check your blood sugar again 15 minutes after you take the carbohydrate. If the second blood sugar test is still at or below 70 mg/dL (3.9 mmol/L), take 15 grams of a carbohydrate again. If your blood sugar doesn't increase above 70 mg/dL (3.9  mmol/L) after 3 tries, get medical help right away. After your blood sugar returns to normal, eat a meal or a snack within 1 hour. Keep taking your daily medicines as told by your provider. Check your blood sugar more often than you normally would. Write down your results. Call your provider if you have trouble keeping your blood sugar in your target range. Green zone These signs mean you're doing well and can continue what you're doing to manage your diabetes: Your blood sugar is within your personal target range. For most people, a blood sugar level before a meal should be 80-130 mg/dL (4.4-7.2 mmol/L). You feel well, and you're able to do daily activities. If you're in the green zone, continue to manage your diabetes as told by your provider. To do this: Eat a healthy diet. Exercise regularly. Check your blood sugar as told. Take your medicines only as told. Where to find more information  American Diabetes Association (ADA): diabetes.org Association of Diabetes Care & Education Specialists (ADCES): adces.org/diabetes-education-dsmes This information is not intended to replace advice given to you by your health care provider. Make sure you discuss any questions you have with your health care provider. Document Revised: 09/24/2022 Document Reviewed: 09/24/2022 Elsevier Patient Education  2024 [employer].

## 2023-06-18 NOTE — Congregational Nurse Program (Signed)
  Dept: [phone]   Congregational Nurse Program Note  Date of Encounter: 06/18/2023 Client to [facility] day center for RN visit. Client has an [facility]. Medicaid application completed and dropped off on Tuesday 4/29. [employer] passes given for transportation assistance. Medication box filled for this evening through Monday evening for client. He does not keep his metformin  and glipizide  with him, but leaves them locked in the RN office. Client again declined having his blood  glucose checked. He reports "I'm not eating sweets". He does continue to drink alcohol daily. Juvenal Opoka BSN, RN Past Medical History: Past Medical History:  Diagnosis Date   Alcohol abuse    Diabetes mellitus without complication [facility])     Encounter Details:  Community Questionnaire - 06/18/23 1205       Questionnaire   Ask client: Do you give verbal consent for me to treat you today? Yes    Student Assistance N/A    Location Patient Event organiser    Encounter Setting CN site    Population Status Unhoused    Insurance Uninsured ([facility] Card/Care Connects/Self-Pay/Medicaid Family Planning)   BS/BC through the exchange   Insurance/Financial Assistance Referral Medicaid   RN to assist with Medicaid application.   Medication Patient Medications Reviewed    Medical Provider Yes   [facility]   Screening Referrals Made N/A    Medical Referrals Made [facility] PCP/Clinic   [facility]. call placed for apt   Medical Appointment Completed N/A    CNP Interventions Educate;Advocate/Support;Navigate Healthcare System;Case Management    Screenings CN Performed N/A    ED Visit Averted N/A    Life-Saving Intervention Made N/A

## 2023-06-18 NOTE — Progress Notes (Signed)
 Established Patient Office Visit  Subjective   Patient ID: Richard Estes, male    DOB: 03/01/1960  Age: 63 y.o. MRN: [id number]  No chief complaint on file.   HPI  Richard Estes is 63 years old [place] American male with medical history of diabetes who presents to the clinic for hospital discharge follow up visit. Currently, he was discharged from the Hospital on 06/13/23. During his hospital course, he was treated for uncontrolled diabetes, hemorrhoids, and rectal bleeding.  He had hemorrhoidectomy with Eldred Grego, MD on 06/11/2023. He states he is doing good after surgery. Last Hgb A1c 14.3% and glucose 212 mg/dL on 9/62/9528. He states he is homeless. He sees the Congregational Nurses at [facility] daily to check his blood glucose, but per Nurses note he declined for his blood glucose to be checked today. At today's visit, his blood glucose was 58 mg/dL. He states that he's hungry and weak. He states he ate partial lunch food. Provided him with chips and soda , and his blood glucose was 90 mg/dL when rechecked.  His Bp 108/71 mmHg, Hr 96/minutes. He denies headache, chest pain, shortness of breath, and body ache. He states he has been smoking for over 40 years and does have a plan to quit smoking. He also drinks alcohol, and denies the desire to cut back. Overall, he states that he is doing good and offer no further complaints.    Patient Active Problem List   Diagnosis Date Noted   Rectal bleeding 06/12/2023   Uncontrolled type 2 diabetes mellitus with hyperglycemia, without long-term current use of insulin  (HCC) 06/11/2023   Unhoused person 06/11/2023   Hemorrhoids, internal, with bleeding 06/11/2023   ABLA (acute blood loss anemia) 06/11/2023   Hypokalemia 06/11/2023   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   Encounter to establish care 04/03/2022   Smoking 04/03/2022   Alcohol abuse 04/03/2022   Past Medical History:  Diagnosis Date   Alcohol  abuse    Diabetes mellitus without complication (HCC)    Past Surgical History:  Procedure Laterality Date   RECTAL EXAM UNDER ANESTHESIA N/A 06/11/2023   Procedure: EXAM UNDER ANESTHESIA, RECTUM;  Surgeon: Eldred Grego, MD;  Location: [facility] ORS;  Service: General;  Laterality: N/A;   Social History   Tobacco Use   Smoking status: Every Day    Current packs/day: 1.00    Types: Cigarettes   Smokeless tobacco: Never   Tobacco comments:    Sometimes list  Vaping Use   Vaping status: Never Used  Substance Use Topics   Alcohol use: Yes    Alcohol/week: 22.0 standard drinks of alcohol    Types: 12 Cans of beer, 10 Shots of liquor per week   Drug use: No    Comment: last used marijuana 08/09/16   Family Status  Relation Name Status   Mother  Alive   Father  Deceased  No partnership data on file   Allergies  Allergen Reactions   Bee Venom    Review of Systems  Constitutional: Negative.   HENT: Negative.    Eyes: Negative.   Respiratory: Negative.    Cardiovascular: Negative.   Gastrointestinal: Negative.   Genitourinary: Negative.   Musculoskeletal: Negative.   Skin: Negative.   Neurological: Negative.   Endo/Heme/Allergies: Negative.   Psychiatric/Behavioral: Negative.        Objective:     BP 108/71 (BP Location: Left Arm, Patient Position: Sitting, Cuff Size: Normal)   Pulse 96  Temp 98.3 F (36.8 C) (Oral)   Resp 18   Wt 126 lb 1.6 oz (57.2 kg)   SpO2 97%   BMI 18.62 kg/m  BP Readings from Last 3 Encounters:  06/18/23 108/71  06/13/23 (!) 128/91  03/18/23 (!) 126/90   Wt Readings from Last 3 Encounters:  06/18/23 126 lb 1.6 oz (57.2 kg)  06/11/23 108 lb 3.9 oz (49.1 kg)  10/08/22 146 lb (66.2 kg)     Physical Exam Constitutional:      Appearance: Normal appearance.  HENT:     Head: Normocephalic.  Eyes:     Pupils: Pupils are equal, round, and reactive to light.  Cardiovascular:     Rate and Rhythm: Normal rate and regular rhythm.   Pulmonary:     Effort: Pulmonary effort is normal.     Breath sounds: Normal breath sounds.  Abdominal:     General: Abdomen is flat. Bowel sounds are normal.     Palpations: Abdomen is soft.  Musculoskeletal:        General: Normal range of motion.     Cervical back: Normal range of motion and neck supple.  Skin:    General: Skin is warm and dry.  Neurological:     General: No focal deficit present.     Mental Status: He is alert and oriented to person, place, and time.  Psychiatric:        Mood and Affect: Mood normal.        Behavior: Behavior normal.    No results found for any visits on 06/18/23.  Last CBC Lab Results  Component Value Date   WBC 9.7 06/13/2023   HGB 10.0 (L) 06/13/2023   HCT 28.7 (L) 06/13/2023   MCV 88.0 06/13/2023   MCH 30.7 06/13/2023   RDW 13.2 06/13/2023   PLT 185 06/13/2023   Last metabolic panel Lab Results  Component Value Date   GLUCOSE 212 (H) 06/13/2023   NA 134 (L) 06/13/2023   K 3.9 06/13/2023   CL 100 06/13/2023   CO2 26 06/13/2023   BUN 9 06/13/2023   CREATININE 0.48 (L) 06/13/2023   GFRNONAA >60 06/13/2023   CALCIUM 8.2 (L) 06/13/2023   PHOS 3.4 06/13/2023   PROT 7.2 12/25/2016   ALBUMIN 2.7 (L) 06/13/2023   LABGLOB 2.7 12/25/2016   AGRATIO 1.7 12/25/2016   BILITOT 0.4 12/25/2016   ALKPHOS 58 12/25/2016   AST 27 12/25/2016   ALT 25 12/25/2016   ANIONGAP 8 06/13/2023   Last lipids Lab Results  Component Value Date   CHOL 157 12/25/2016   HDL 36 (L) 12/25/2016   LDLCALC 90 12/25/2016   TRIG 154 (H) 12/25/2016   CHOLHDL 4.4 12/25/2016   Last hemoglobin A1c Lab Results  Component Value Date   HGBA1C 14.3 (H) 06/11/2023   Last thyroid functions No results found for: "TSH", "T3TOTAL", "T4TOTAL", "THYROIDAB" Last vitamin D No results found for: "25OHVITD2", "25OHVITD3", "VD25OH" Last vitamin B12 and Folate Lab Results  Component Value Date   VITAMINB12 447 06/11/2023   FOLATE 7.4 06/11/2023      The  ASCVD Risk score (Arnett DK, et al., 2019) failed to calculate for the following reasons:   Cannot find a previous HDL lab   Cannot find a previous total cholesterol lab    Assessment & Plan:  1. Diabetes mellitus without complication (HCC) (Primary) His last Hgb A1c 14.3% on 4/24/205. His goal <7%. At today's visit, his glucose was 58 mg/dL. He was instructed  to check glucose daily and bring log at the next clinical visit. He will continue his current medications. - metFORMIN  (GLUCOPHAGE ) 850 MG tablet; Take 1 tablet (850 mg total) by mouth 2 (two) times daily with a meal.  Dispense: 60 tablet; Refill: 0 - POCT Glucose (CBG); Future - POCT Glucose (CBG); Future - POCT Glucose (CBG) - POCT Glucose (CBG)  2. Encounter to establish care  He was encouraged to follow low concentrated sweat diet and exercise as tolerated, as well as check his glucose and feet daily. He was instructed to take his medications as prescribed.   3. Alcohol abuse He was encouraged to quit drinking alcohol. 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  - EPINEPHrine  0.3 mg/0.3 mL IJ SOAJ injection; Inject 0.3 mg into the muscle as needed for anaphylaxis.  Dispense: 2 each; Refill: 5  Follow up in 2 weeks (07/02/2023) in clinic    Debbrah Faith, NP

## 2023-06-19 ENCOUNTER — Other Ambulatory Visit: Payer: Self-pay

## 2023-06-19 ENCOUNTER — Observation Stay
Admission: EM | Admit: 2023-06-19 | Discharge: 2023-06-21 | Disposition: A | Discharge status: Home / Self Care | Payer: MEDICAID | Attending: Internal Medicine | Admitting: Internal Medicine

## 2023-06-19 ENCOUNTER — Encounter
Admission: EM | Disposition: A | Discharge status: Home / Self Care | Payer: Self-pay | Source: Home / Self Care | Attending: Emergency Medicine

## 2023-06-19 ENCOUNTER — Emergency Department: Payer: MEDICAID | Admitting: Certified Registered"

## 2023-06-19 ENCOUNTER — Emergency Department: Payer: MEDICAID

## 2023-06-19 DIAGNOSIS — Z79899 Other long term (current) drug therapy: Secondary | ICD-10-CM | POA: Insufficient documentation

## 2023-06-19 DIAGNOSIS — E119 Type 2 diabetes mellitus without complications: Secondary | ICD-10-CM | POA: Diagnosis not present

## 2023-06-19 DIAGNOSIS — E871 Hypo-osmolality and hyponatremia: Secondary | ICD-10-CM | POA: Insufficient documentation

## 2023-06-19 DIAGNOSIS — E876 Hypokalemia: Secondary | ICD-10-CM | POA: Insufficient documentation

## 2023-06-19 DIAGNOSIS — K626 Ulcer of anus and rectum: Secondary | ICD-10-CM

## 2023-06-19 DIAGNOSIS — K922 Gastrointestinal hemorrhage, unspecified: Secondary | ICD-10-CM | POA: Diagnosis not present

## 2023-06-19 DIAGNOSIS — Z7984 Long term (current) use of oral hypoglycemic drugs: Secondary | ICD-10-CM | POA: Diagnosis not present

## 2023-06-19 DIAGNOSIS — F1010 Alcohol abuse, uncomplicated: Secondary | ICD-10-CM | POA: Diagnosis not present

## 2023-06-19 DIAGNOSIS — K625 Hemorrhage of anus and rectum: Secondary | ICD-10-CM | POA: Diagnosis present

## 2023-06-19 DIAGNOSIS — F17210 Nicotine dependence, cigarettes, uncomplicated: Secondary | ICD-10-CM | POA: Insufficient documentation

## 2023-06-19 HISTORY — PX: RECTAL EXAM UNDER ANESTHESIA: SHX6399

## 2023-06-19 LAB — CBC WITH DIFFERENTIAL/PLATELET
Abs Immature Granulocytes: 0.04 10*3/uL (ref 0.00–0.07)
Basophils Absolute: 0.1 10*3/uL (ref 0.0–0.1)
Basophils Relative: 1 %
Eosinophils Absolute: 0.1 10*3/uL (ref 0.0–0.5)
Eosinophils Relative: 1 %
HCT: 25.3 % — ABNORMAL LOW (ref 39.0–52.0)
Hemoglobin: 8.4 g/dL — ABNORMAL LOW (ref 13.0–17.0)
Immature Granulocytes: 1 %
Lymphocytes Relative: 36 %
Lymphs Abs: 3.1 10*3/uL (ref 0.7–4.0)
MCH: 30.7 pg (ref 26.0–34.0)
MCHC: 33.2 g/dL (ref 30.0–36.0)
MCV: 92.3 fL (ref 80.0–100.0)
Monocytes Absolute: 0.9 10*3/uL (ref 0.1–1.0)
Monocytes Relative: 11 %
Neutro Abs: 4.4 10*3/uL (ref 1.7–7.7)
Neutrophils Relative %: 50 %
Platelets: 250 10*3/uL (ref 150–400)
RBC: 2.74 MIL/uL — ABNORMAL LOW (ref 4.22–5.81)
RDW: 12.5 % (ref 11.5–15.5)
Smear Review: NORMAL
WBC: 8.5 10*3/uL (ref 4.0–10.5)
nRBC: 0 % (ref 0.0–0.2)

## 2023-06-19 LAB — COMPREHENSIVE METABOLIC PANEL WITH GFR
ALT: 102 U/L — ABNORMAL HIGH (ref 0–44)
AST: 114 U/L — ABNORMAL HIGH (ref 15–41)
Albumin: 2.9 g/dL — ABNORMAL LOW (ref 3.5–5.0)
Alkaline Phosphatase: 76 U/L (ref 38–126)
Anion gap: 11 (ref 5–15)
BUN: 7 mg/dL — ABNORMAL LOW (ref 8–23)
CO2: 26 mmol/L (ref 22–32)
Calcium: 8.2 mg/dL — ABNORMAL LOW (ref 8.9–10.3)
Chloride: 92 mmol/L — ABNORMAL LOW (ref 98–111)
Creatinine, Ser: 0.64 mg/dL (ref 0.61–1.24)
GFR, Estimated: >60 mL/min (ref >60)
Glucose, Bld: 140 mg/dL — ABNORMAL HIGH (ref 70–99)
Potassium: 2.7 mmol/L — CL (ref 3.5–5.1)
Sodium: 129 mmol/L — ABNORMAL LOW (ref 135–145)
Total Bilirubin: 0.6 mg/dL (ref 0.0–1.2)
Total Protein: 6.6 g/dL (ref 6.5–8.1)

## 2023-06-19 LAB — CBG MONITORING, ED
Glucose-Capillary: 155 mg/dL — ABNORMAL HIGH (ref 70–99)
Glucose-Capillary: 139 mg/dL — ABNORMAL HIGH (ref 70–99)
Glucose-Capillary: 314 mg/dL — ABNORMAL HIGH (ref 70–99)

## 2023-06-19 LAB — HEMOGLOBIN AND HEMATOCRIT, BLOOD
HCT: 31.4 % — ABNORMAL LOW (ref 39.0–52.0)
Hemoglobin: 10.6 g/dL — ABNORMAL LOW (ref 13.0–17.0)

## 2023-06-19 LAB — CBC
HCT: 34.4 % — ABNORMAL LOW (ref 39.0–52.0)
Hemoglobin: 11.3 g/dL — ABNORMAL LOW (ref 13.0–17.0)
MCH: 29.7 pg (ref 26.0–34.0)
MCHC: 32.8 g/dL (ref 30.0–36.0)
MCV: 90.3 fL (ref 80.0–100.0)
Platelets: 270 10*3/uL (ref 150–400)
RBC: 3.81 MIL/uL — ABNORMAL LOW (ref 4.22–5.81)
RDW: 13.9 % (ref 11.5–15.5)
WBC: 9.9 10*3/uL (ref 4.0–10.5)
nRBC: 0 % (ref 0.0–0.2)

## 2023-06-19 LAB — BASIC METABOLIC PANEL WITH GFR
Anion gap: 19 — ABNORMAL HIGH (ref 5–15)
BUN: 7 mg/dL — ABNORMAL LOW (ref 8–23)
CO2: 20 mmol/L — ABNORMAL LOW (ref 22–32)
Calcium: 8.2 mg/dL — ABNORMAL LOW (ref 8.9–10.3)
Chloride: 94 mmol/L — ABNORMAL LOW (ref 98–111)
Creatinine, Ser: 0.61 mg/dL (ref 0.61–1.24)
GFR, Estimated: >60 mL/min (ref >60)
Glucose, Bld: 174 mg/dL — ABNORMAL HIGH (ref 70–99)
Potassium: 3.5 mmol/L (ref 3.5–5.1)
Sodium: 133 mmol/L — ABNORMAL LOW (ref 135–145)

## 2023-06-19 LAB — MAGNESIUM
Magnesium: 1.3 mg/dL — ABNORMAL LOW (ref 1.7–2.4)
Magnesium: 1.3 mg/dL — ABNORMAL LOW (ref 1.7–2.4)

## 2023-06-19 LAB — PREPARE RBC (CROSSMATCH)

## 2023-06-19 LAB — PHOSPHORUS: Phosphorus: 4.2 mg/dL (ref 2.5–4.6)

## 2023-06-19 LAB — PROTIME-INR
INR: 1.1 (ref 0.8–1.2)
Prothrombin Time: 14.8 s (ref 11.4–15.2)

## 2023-06-19 LAB — GLUCOSE, CAPILLARY
Glucose-Capillary: 232 mg/dL — ABNORMAL HIGH (ref 70–99)
Glucose-Capillary: 221 mg/dL — ABNORMAL HIGH (ref 70–99)
Glucose-Capillary: 182 mg/dL — ABNORMAL HIGH (ref 70–99)

## 2023-06-19 LAB — POTASSIUM: Potassium: 4.6 mmol/L (ref 3.5–5.1)

## 2023-06-19 SURGERY — EXAM UNDER ANESTHESIA, RECTUM
Anesthesia: General

## 2023-06-19 MED ORDER — GELATIN ABSORBABLE 12-7 MM EX MISC
CUTANEOUS | Status: AC
  Filled 2023-06-19: qty 1

## 2023-06-19 MED ORDER — POTASSIUM CHLORIDE CRYS ER 20 MEQ PO TBCR
EXTENDED_RELEASE_TABLET | ORAL | Status: AC
  Filled 2023-06-19: qty 2

## 2023-06-19 MED ORDER — GLYCOPYRROLATE 0.2 MG/ML IJ SOLN
INTRAMUSCULAR | Status: DC | PRN
  Administered 2023-06-19: .2 mg via INTRAVENOUS

## 2023-06-19 MED ORDER — TRANEXAMIC ACID-NACL 1000-0.7 MG/100ML-% IV SOLN
1000.0000 mg | INTRAVENOUS | Status: AC
  Administered 2023-06-19: 1000 mg via INTRAVENOUS
  Filled 2023-06-19: qty 100

## 2023-06-19 MED ORDER — GELATIN ABSORBABLE 100 EX MISC
CUTANEOUS | Status: DC | PRN
  Administered 2023-06-19: 1

## 2023-06-19 MED ORDER — FENTANYL CITRATE (PF) 100 MCG/2ML IJ SOLN
INTRAMUSCULAR | Status: DC | PRN
  Administered 2023-06-19 (×2): 50 ug via INTRAVENOUS

## 2023-06-19 MED ORDER — SUCCINYLCHOLINE CHLORIDE 200 MG/10ML IV SOSY
PREFILLED_SYRINGE | INTRAVENOUS | Status: AC
  Filled 2023-06-19: qty 10

## 2023-06-19 MED ORDER — IOHEXOL 350 MG/ML SOLN
100.0000 mL | Freq: Once | INTRAVENOUS | Status: AC | PRN
  Administered 2023-06-19: 100 mL via INTRAVENOUS

## 2023-06-19 MED ORDER — PHENYLEPHRINE 80 MCG/ML (10ML) SYRINGE FOR IV PUSH (FOR BLOOD PRESSURE SUPPORT)
PREFILLED_SYRINGE | INTRAVENOUS | Status: DC | PRN
  Administered 2023-06-19 (×3): 160 ug via INTRAVENOUS

## 2023-06-19 MED ORDER — LACTATED RINGERS IV BOLUS
1000.0000 mL | Freq: Once | INTRAVENOUS | Status: AC
  Administered 2023-06-19: 1000 mL via INTRAVENOUS

## 2023-06-19 MED ORDER — INSULIN ASPART 100 UNIT/ML IJ SOLN
INTRAMUSCULAR | Status: AC
  Filled 2023-06-19: qty 1

## 2023-06-19 MED ORDER — OXYCODONE HCL 5 MG PO TABS: 5.0000 mg | ORAL_TABLET | Freq: Once | ORAL | Status: DC | PRN

## 2023-06-19 MED ORDER — ONDANSETRON HCL 4 MG/2ML IJ SOLN
INTRAMUSCULAR | Status: DC | PRN
  Administered 2023-06-19: 4 mg via INTRAVENOUS

## 2023-06-19 MED ORDER — MAGNESIUM SULFATE 2 GM/50ML IV SOLN
2.0000 g | Freq: Once | INTRAVENOUS | Status: AC
  Administered 2023-06-19: 2 g via INTRAVENOUS
  Filled 2023-06-19: qty 50

## 2023-06-19 MED ORDER — POLYETHYLENE GLYCOL 3350 17 G PO PACK
17.0000 g | PACK | Freq: Every day | ORAL | Status: DC
  Administered 2023-06-19 – 2023-06-20 (×2): 17 g via ORAL
  Filled 2023-06-19 (×4): qty 1

## 2023-06-19 MED ORDER — PROPOFOL 10 MG/ML IV BOLUS
INTRAVENOUS | Status: DC | PRN
  Administered 2023-06-19: 200 mg via INTRAVENOUS

## 2023-06-19 MED ORDER — SUGAMMADEX SODIUM 200 MG/2ML IV SOLN
INTRAVENOUS | Status: DC | PRN
  Administered 2023-06-19: 150 mg via INTRAVENOUS

## 2023-06-19 MED ORDER — LIDOCAINE HCL (CARDIAC) PF 100 MG/5ML IV SOSY
PREFILLED_SYRINGE | INTRAVENOUS | Status: DC | PRN
  Administered 2023-06-19: 100 mg via INTRAVENOUS

## 2023-06-19 MED ORDER — DEXAMETHASONE SODIUM PHOSPHATE 10 MG/ML IJ SOLN
INTRAMUSCULAR | Status: DC | PRN
  Administered 2023-06-19: 10 mg via INTRAVENOUS

## 2023-06-19 MED ORDER — SODIUM CHLORIDE 0.9 % IV SOLN: Freq: Once | INTRAVENOUS | Status: AC

## 2023-06-19 MED ORDER — FENTANYL CITRATE (PF) 100 MCG/2ML IJ SOLN
INTRAMUSCULAR | Status: AC
  Filled 2023-06-19: qty 2

## 2023-06-19 MED ORDER — LIDOCAINE HCL (PF) 2 % IJ SOLN
INTRAMUSCULAR | Status: AC
  Filled 2023-06-19: qty 5

## 2023-06-19 MED ORDER — PROPOFOL 10 MG/ML IV BOLUS
INTRAVENOUS | Status: AC
  Filled 2023-06-19: qty 20

## 2023-06-19 MED ORDER — FENTANYL CITRATE (PF) 100 MCG/2ML IJ SOLN: 25.0000 ug | INTRAMUSCULAR | Status: DC | PRN

## 2023-06-19 MED ORDER — OXYCODONE HCL 5 MG PO TABS: 5.0000 mg | ORAL_TABLET | ORAL | Status: DC | PRN

## 2023-06-19 MED ORDER — SODIUM BICARBONATE 8.4 % IV SOLN
INTRAVENOUS | Status: AC
  Filled 2023-06-19: qty 50

## 2023-06-19 MED ORDER — INSULIN ASPART 100 UNIT/ML IJ SOLN: 0.0000 [IU] | Freq: Every day | INTRAMUSCULAR | Status: DC

## 2023-06-19 MED ORDER — ACETAMINOPHEN 500 MG PO TABS: 1000.0000 mg | ORAL_TABLET | Freq: Four times a day (QID) | ORAL | Status: DC | PRN

## 2023-06-19 MED ORDER — ALBUTEROL SULFATE (2.5 MG/3ML) 0.083% IN NEBU: 2.5000 mg | INHALATION_SOLUTION | Freq: Four times a day (QID) | RESPIRATORY_TRACT | Status: DC | PRN

## 2023-06-19 MED ORDER — OXYCODONE HCL 5 MG/5ML PO SOLN: 5.0000 mg | Freq: Once | ORAL | Status: DC | PRN

## 2023-06-19 MED ORDER — ROCURONIUM BROMIDE 100 MG/10ML IV SOLN
INTRAVENOUS | Status: DC | PRN
  Administered 2023-06-19: 30 mg via INTRAVENOUS

## 2023-06-19 MED ORDER — BUPIVACAINE LIPOSOME 1.3 % IJ SUSP
INTRAMUSCULAR | Status: DC | PRN
Start: 2023-06-19 — End: 2023-06-19
  Administered 2023-06-19: 10 mL

## 2023-06-19 MED ORDER — DEXAMETHASONE SODIUM PHOSPHATE 10 MG/ML IJ SOLN
INTRAMUSCULAR | Status: AC
  Filled 2023-06-19: qty 1

## 2023-06-19 MED ORDER — ONDANSETRON HCL 4 MG/2ML IJ SOLN
INTRAMUSCULAR | Status: AC
Start: 2023-06-19 — End: ?
  Filled 2023-06-19: qty 2

## 2023-06-19 MED ORDER — SUCCINYLCHOLINE CHLORIDE 200 MG/10ML IV SOSY
PREFILLED_SYRINGE | INTRAVENOUS | Status: DC | PRN
  Administered 2023-06-19: 100 mg via INTRAVENOUS

## 2023-06-19 MED ORDER — BUPIVACAINE LIPOSOME 1.3 % IJ SUSP
INTRAMUSCULAR | Status: AC
Start: 2023-06-19 — End: ?
  Filled 2023-06-19: qty 20

## 2023-06-19 MED ORDER — POTASSIUM CHLORIDE 10 MEQ/100ML IV SOLN: 10.0000 meq | Freq: Once | INTRAVENOUS | Status: DC

## 2023-06-19 MED ORDER — HYDROMORPHONE HCL 1 MG/ML IJ SOLN: 0.5000 mg | INTRAMUSCULAR | Status: DC | PRN

## 2023-06-19 MED ORDER — POTASSIUM CHLORIDE IN NACL 20-0.9 MEQ/L-% IV SOLN
INTRAVENOUS | Status: DC
  Filled 2023-06-19 (×3): qty 1000

## 2023-06-19 MED ORDER — INSULIN ASPART 100 UNIT/ML IJ SOLN
0.0000 [IU] | Freq: Three times a day (TID) | INTRAMUSCULAR | Status: DC
  Administered 2023-06-19: 15 [IU] via SUBCUTANEOUS
  Administered 2023-06-19: 7 [IU] via SUBCUTANEOUS
  Administered 2023-06-20: 11 [IU] via SUBCUTANEOUS
  Administered 2023-06-20: 7 [IU] via SUBCUTANEOUS
  Administered 2023-06-20: 4 [IU] via SUBCUTANEOUS
  Administered 2023-06-21: 3 [IU] via SUBCUTANEOUS
  Filled 2023-06-19 (×6): qty 1

## 2023-06-19 MED ORDER — SODIUM CHLORIDE 0.9 % IV SOLN: 10.0000 mL/h | Freq: Once | INTRAVENOUS | Status: DC

## 2023-06-19 MED ORDER — GLYCOPYRROLATE 0.2 MG/ML IJ SOLN
INTRAMUSCULAR | Status: AC
Start: 2023-06-19 — End: ?
  Filled 2023-06-19: qty 1

## 2023-06-19 MED ORDER — POTASSIUM CHLORIDE CRYS ER 20 MEQ PO TBCR
40.0000 meq | EXTENDED_RELEASE_TABLET | ORAL | Status: AC
  Administered 2023-06-19 (×3): 40 meq via ORAL

## 2023-06-19 MED ORDER — BUPIVACAINE-EPINEPHRINE (PF) 0.5% -1:200000 IJ SOLN
INTRAMUSCULAR | Status: AC
  Filled 2023-06-19: qty 30

## 2023-06-19 MED ORDER — BUPIVACAINE-EPINEPHRINE 0.5% -1:200000 IJ SOLN
INTRAMUSCULAR | Status: DC | PRN
  Administered 2023-06-19: 30 mL

## 2023-06-19 SURGICAL SUPPLY — 28 items
BRIEF MESH DISP 2XL (UNDERPADS AND DIAPERS) ×1 IMPLANT
DRAPE PERI LITHO V/GYN (MISCELLANEOUS) ×1 IMPLANT
DRSG GAUZE FLUFF 36X18 (GAUZE/BANDAGES/DRESSINGS) ×1 IMPLANT
ELECTRODE CAUTERY BLDE TIP 2.5 (TIP) ×1 IMPLANT
ELECTRODE REM PT RTRN 9FT ADLT (ELECTROSURGICAL) ×1 IMPLANT
GAUZE 4X4 16PLY ~~LOC~~+RFID DBL (SPONGE) IMPLANT
GLOVE SURG SYN 7.0 (GLOVE) ×1 IMPLANT
GLOVE SURG SYN 7.0 PF PI (GLOVE) ×1 IMPLANT
GLOVE SURG SYN 7.5 E (GLOVE) ×1 IMPLANT
GLOVE SURG SYN 7.5 PF PI (GLOVE) ×1 IMPLANT
GOWN STRL REUS W/ TWL LRG LVL3 (GOWN DISPOSABLE) ×2 IMPLANT
KIT TURNOVER KIT A (KITS) ×1 IMPLANT
LABEL OR SOLS (LABEL) ×1 IMPLANT
MANIFOLD NEPTUNE II (INSTRUMENTS) ×1 IMPLANT
NDL HYPO 22X1.5 SAFETY MO (MISCELLANEOUS) ×1 IMPLANT
NEEDLE HYPO 22X1.5 SAFETY MO (MISCELLANEOUS) ×1 IMPLANT
NS IRRIG 500ML POUR BTL (IV SOLUTION) ×1 IMPLANT
PACK BASIN MINOR ARMC (MISCELLANEOUS) ×1 IMPLANT
PAD ABD DERMACEA PRESS 5X9 (GAUZE/BANDAGES/DRESSINGS) IMPLANT
PAD OB MATERNITY 11 LF (PERSONAL CARE ITEMS) ×1 IMPLANT
PAD PREP OB/GYN DISP 24X41 (PERSONAL CARE ITEMS) ×1 IMPLANT
SOLUTION PREP PVP 2OZ (MISCELLANEOUS) ×1 IMPLANT
SURGILUBE 2OZ TUBE FLIPTOP (MISCELLANEOUS) ×1 IMPLANT
SUT VIC AB 2-0 SH 27XBRD (SUTURE) ×2 IMPLANT
SYR 10ML LL (SYRINGE) ×2 IMPLANT
TAPE TRANSPORE STRL 2 31045 (GAUZE/BANDAGES/DRESSINGS) IMPLANT
TRAP FLUID SMOKE EVACUATOR (MISCELLANEOUS) ×1 IMPLANT
WATER STERILE IRR 500ML POUR (IV SOLUTION) ×1 IMPLANT

## 2023-06-19 NOTE — Consult Note (Addendum)
 Date of Consultation:  06/19/2023  Requesting Physician:  Lynnda Sas, MD  Reason for Consultation:  Rectal bleeding  History of Present Illness: Richard Estes is a 63 y.o. male presenting with significant rectal bleeding.  He was admitted on 06/10/23 with hyperglycemia and was found to have a very significantly elevated A1c of 14.3.  The next day, he had significant bleeding per rectum and Dr. Dortha Gauss took him to the OR for exam under anesthesia and suture ligation of an anal ulcer with active bleeding.  He was discharged on 06/13/23.  He presented for follow up to walk in clinic yesterday at which time he was not bleeding, but then presented to the ED overnight with profuse rectal bleeding again.  He reported that he was just needing to have a bowel movement, but it was all blood that came out.  He denies being constipated at home. His Hgb was down to 8.4 from 10 on his day of discharge.  He is also hypokalemic, hyponatremic, hypomagnesemic.  He does have a history of alcohol abuse and reports he drinks 6 beers a day, and sometimes more.  Denies any drug abuse.  Past Medical History: Past Medical History:  Diagnosis Date   Alcohol abuse    Diabetes mellitus without complication (HCC)      Past Surgical History: Past Surgical History:  Procedure Laterality Date   RECTAL EXAM UNDER ANESTHESIA N/A 06/11/2023   Procedure: EXAM UNDER ANESTHESIA, RECTUM;  Surgeon: Eldred Grego, MD;  Location: [facility] ORS;  Service: General;  Laterality: N/A;    Home Medications: Prior to Admission medications   Medication Sig Start Date End Date Taking? Authorizing Provider  albuterol  (VENTOLIN  HFA) 108 (90 Base) MCG/ACT inhaler Inhale 2 puffs into the lungs every 6 (six) hours as needed for shortness of breath or wheezing. 12/23/22 12/23/23  [provider]  Blood Glucose Monitoring Suppl (BLOOD GLUCOSE MONITOR SYSTEM) w/Device KIT Use 3 (three) times daily to check blood sugar 06/12/23    Sheril Dines, MD  Blood Glucose Monitoring Suppl (TRUE METRIX METER) w/Device KIT use to check blood glucose 10/08/22   Iloabachie, Chioma E, NP  EPINEPHrine  0.3 mg/0.3 mL IJ SOAJ injection Inject 0.3 mg into the muscle as needed for anaphylaxis. 06/18/23   Iloabachie, Chioma E, NP  glipiZIDE  (GLUCOTROL  XL) 10 MG 24 hr tablet Take 1 tablet (10 mg total) by mouth daily with breakfast. 06/18/23   Debbrah Faith, NP  Glucose Blood (BLOOD GLUCOSE TEST STRIPS) STRP Use 3 (three) times daily. Use as directed to check blood sugar. 06/12/23   Sheril Dines, MD  Glucose Blood (BLOOD GLUCOSE TEST STRIPS) STRP Use as directed to check blood sugar 4 (four) times daily. 10/08/22   Iloabachie, Chioma E, NP  Lancet Device MISC Use 3 (three) times daily to check blood sugar 06/12/23   Sheril Dines, MD  TRUEplus Lancets 33G MISC Use as directed to check blood sugar up 4 (four) times daily. 10/08/22   Iloabachie, Chioma E, NP  Lancets MISC Use 3 (three) times daily. Use as directed to check blood sugar. 06/12/23   Sheril Dines, MD  metFORMIN  (GLUCOPHAGE ) 850 MG tablet Take 1 tablet (850 mg total) by mouth 2 (two) times daily with a meal. 06/18/23   Debbrah Faith, NP    Allergies: Allergies  Allergen Reactions   Bee Venom     Social History:  reports that he has been smoking cigarettes. He has never used smokeless tobacco. He reports current alcohol use of  about 22.0 standard drinks of alcohol per week. He reports that he does not use drugs.   Family History: Family History  Problem Relation Age of Onset   Arthritis Mother     Review of Systems: Review of Systems  Constitutional:  Negative for chills and fever.  HENT:  Negative for hearing loss.   Respiratory:  Negative for shortness of breath.   Cardiovascular:  Negative for chest pain.  Gastrointestinal:  Positive for blood in stool. Negative for abdominal pain, nausea and vomiting.  Genitourinary:  Negative for dysuria.  Musculoskeletal:  Negative for myalgias.     Physical Exam BP (!) 136/99   Pulse 88   Temp 98.7 F (37.1 C) (Tympanic)   Resp 16   Ht 5\' 9"  (1.753 m)   Wt 66.4 kg   SpO2 100%   BMI 21.62 kg/m  CONSTITUTIONAL: No acute distress HEENT:  Normocephalic, atraumatic, extraocular motion intact. RESPIRATORY:  Normal respiratory effort without pathologic use of accessory muscles. CARDIOVASCULAR: Regular rhythm and rate GI: The abdomen is soft, non-distended, non-tender.  RECTAL:  Unable to do an appropriate rectal exam.  He has adult diapers in place and when removing them, he has a significant amount of blood and clots.  Unable to localize source of bleeding at this point. MUSCULOSKELETAL:  Normal muscle strength and tone in all four extremities.  No peripheral edema or cyanosis. SKIN: Skin turgor is normal. There are no pathologic skin lesions.  NEUROLOGIC:  Motor and sensation is grossly normal.  Cranial nerves are grossly intact. PSYCH:  Alert and oriented to person, place and time. Affect is normal.  Laboratory Analysis: Results for orders placed or performed during the hospital encounter of 06/19/23 (from the past 24 hours)  Type and screen [facility]     Status: None (Preliminary result)   Collection Time: 06/19/23  1:40 AM  Result Value Ref Range   ABO/RH(D) A POS    Antibody Screen NEG    Sample Expiration 06/22/2023,2359    Unit Number [id number]    Blood Component Type RED CELLS,LR    Unit division 00    Status of Unit ISSUED    Transfusion Status OK TO TRANSFUSE    Crossmatch Result COMPATIBLE    Unit Number [id number]    Blood Component Type RED CELLS,LR    Unit division 00    Status of Unit ISSUED    Transfusion Status OK TO TRANSFUSE    Crossmatch Result COMPATIBLE    Unit Number [id number]    Blood Component Type RBC LR PHER1    Unit division 00    Status of Unit ALLOCATED    Transfusion Status OK TO TRANSFUSE    Crossmatch Result      Compatible Performed at [facility], [address] [place], [place] [address]    Unit Number [id number]    Blood Component Type RED CELLS,LR    Unit division 00    Status of Unit ALLOCATED    Transfusion Status OK TO TRANSFUSE    Crossmatch Result Compatible   Comprehensive metabolic panel     Status: Abnormal   Collection Time: 06/19/23  1:41 AM  Result Value Ref Range   Sodium 129 (L) 135 - 145 mmol/L   Potassium 2.7 (LL) 3.5 - 5.1 mmol/L   Chloride 92 (L) 98 - 111 mmol/L   CO2 26 22 - 32 mmol/L   Glucose, Bld 140 (H) 70 - 99 mg/dL   BUN 7 (  L) 8 - 23 mg/dL   Creatinine, Ser 6.96 0.61 - 1.24 mg/dL   Calcium 8.2 (L) 8.9 - 10.3 mg/dL   Total Protein 6.6 6.5 - 8.1 g/dL   Albumin 2.9 (L) 3.5 - 5.0 g/dL   AST 295 (H) 15 - 41 U/L   ALT 102 (H) 0 - 44 U/L   Alkaline Phosphatase 76 38 - 126 U/L   Total Bilirubin 0.6 0.0 - 1.2 mg/dL   GFR, Estimated >28 >41 mL/min   Anion gap 11 5 - 15  CBC WITH DIFFERENTIAL     Status: Abnormal   Collection Time: 06/19/23  1:41 AM  Result Value Ref Range   WBC 8.5 4.0 - 10.5 K/uL   RBC 2.74 (L) 4.22 - 5.81 MIL/uL   Hemoglobin 8.4 (L) 13.0 - 17.0 g/dL   HCT 32.4 (L) 40.1 - 02.7 %   MCV 92.3 80.0 - 100.0 fL   MCH 30.7 26.0 - 34.0 pg   MCHC 33.2 30.0 - 36.0 g/dL   RDW 25.3 66.4 - 40.3 %   Platelets 250 150 - 400 K/uL   nRBC 0.0 0.0 - 0.2 %   Neutrophils Relative % 50 %   Neutro Abs 4.4 1.7 - 7.7 K/uL   Lymphocytes Relative 36 %   Lymphs Abs 3.1 0.7 - 4.0 K/uL   Monocytes Relative 11 %   Monocytes Absolute 0.9 0.1 - 1.0 K/uL   Eosinophils Relative 1 %   Eosinophils Absolute 0.1 0.0 - 0.5 K/uL   Basophils Relative 1 %   Basophils Absolute 0.1 0.0 - 0.1 K/uL   WBC Morphology MORPHOLOGY UNREMARKABLE    RBC Morphology MORPHOLOGY UNREMARKABLE    Smear Review Normal platelet morphology    Immature Granulocytes 1 %   Abs Immature Granulocytes 0.04 0.00 - 0.07 K/uL  Protime-INR     Status: None   Collection Time: 06/19/23  1:41 AM  Result Value Ref Range    Prothrombin Time 14.8 11.4 - 15.2 seconds   INR 1.1 0.8 - 1.2  Magnesium      Status: Abnormal   Collection Time: 06/19/23  1:41 AM  Result Value Ref Range   Magnesium  1.3 (L) 1.7 - 2.4 mg/dL  Prepare RBC     Status: None (Preliminary result)   Collection Time: 06/19/23  2:27 AM  Result Value Ref Range   Order Confirmation PENDING   Prepare RBC (crossmatch)     Status: None   Collection Time: 06/19/23  2:31 AM  Result Value Ref Range   Order Confirmation      ORDER PROCESSED BY BLOOD BANK Performed at [facility], [address] [place], [place] [address]     Imaging: CT ANGIO GI BLEED Result Date: 06/19/2023 EXAM: CTA ABDOMEN AND PELVIS WITH AND WITHOUT CONTRAST 06/19/2023 02:01:53 AM TECHNIQUE: CTA images of the abdomen and pelvis with and without intravenous contrast. Three-dimensional MIP/volume rendered formations were performed. Automated exposure control, iterative reconstruction, and/or weight based adjustment of the mA/kV was utilized to reduce the radiation dose to as low as reasonably achievable. COMPARISON: 06/10/2023 CLINICAL HISTORY: Acute severe hematochezia after recent rectal surgery. BIB ems for rectal bleeding, [facility] was not bleeding at that time. Had Sx last week for internal hemorrhoids. Went to have BM about 1hr pta per pt "nothing but blood". Pt had to have blood transfusion last week after sx. During triage pt states "I can just feel it coming out." FINDINGS: VASCULATURE: Atherosclerotic calcifications  of the abdominal aorta and branch vessels, although patent. 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 GI BLEED: Mild rectal wall thickening with active extravasation in the lower rectum following contrast administration (series 9 / image 202). AORTA: No acute finding. No abdominal aortic aneurysm. No dissection. CELIAC TRUNK: No acute finding. No occlusion or significant stenosis. SUPERIOR MESEMTRIC ARTERY: No acute finding. No  occlusion or significant stenosis. INFERIOR MESENTERIC ARTERY: No acute finding. No occlusion or significant stenosis. RENAL ARTERIES: No acute finding. No occlusion or significant stenosis. 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 ABDOMEN/PELVIS: LOWER CHEST: Visualized portion of the lower chest demonstrates no acute abnormality. HEPATOBILIARY: Liver enhances normally without evidence of intrahepatic biliary ductal dilatation. The gallbladder is unremarkable. SPLEEN: The spleen is unremarkable. PANCREAS: The pancreas is unremarkable. ADRENAL GLANDS: Bilateral adrenal glands demonstrate no acute abnormality. KIDNEYS, URETERS AND BLADDER: No stones in the kidneys or ureters. No evidence of hydronephrosis. No evidence of perinephric or periureteral stranding. Urinary bladder is unremarkable. GI AND BOWEL: Stomach and duodenal sweep demonstrate no acute abnormality. There is no evidence of bowel obstruction. No evidence of abnormal bowel wall thickening or distension. No evidence of acute appendicitis. REPRODUCTIVE: Reproductive organs are unremarkable. PERITONEUM AND RETROPERITONEUM: No evidence of ascites or free air. Aorta is normal in caliber. LYMPH NODES: No evidence of lymphadenopathy. BONES AND SOFT TISSUES: No acute abnormality of the bones. No acute soft tissue abnormality. IMPRESSION: 1. Mild rectal wall thickening with active lower rectal bleeding. Electronically signed by: Zadie Herter MD 06/19/2023 02:15 AM EDT RP Workstation: [id number]    Assessment and Plan: This is a 63 y.o. male with recurrent rectal bleeding.  --The patient had recurrence of profuse rectal bleeding.  He has required emergent transfusion of pRBC in the ER.  CTA shows again active lower rectal bleeding with mild rectal wall thickening.  Likely bleeding from same area as before.   --Will have to take him emergently to OR for another exam under anesthesia and ligation of arterial bleed.  Despite of his  electrolytes being deranged, unfortunately he is losing blood significantly and cannot wait for appropriate correction.  Will declare this as an emergency. --Case discussed with patient and he's willing to proceed. --Have discussed with Dr. Achilles Holes about admitting to medical team given his significantly uncontrolled diabetes.  I spent 60 minutes dedicated to the care of this patient on the date of this encounter to include pre-visit review of records, face-to-face time with the patient discussing diagnosis and management, and any post-visit coordination of care.   Marene Shape, MD  [facility]:  [phone]

## 2023-06-19 NOTE — Anesthesia Preprocedure Evaluation (Addendum)
 Anesthesia Evaluation  Patient identified by MRN, date of birth, ID band Patient awake    Reviewed: Allergy & Precautions, NPO status , Patient's Chart, lab work & pertinent test results  Airway Mallampati: III  TM Distance: >3 FB Neck ROM: full    Dental  (+) Chipped, Dental Advidsory Given   Pulmonary Current Smoker   Pulmonary exam normal        Cardiovascular negative cardio ROS Normal cardiovascular exam     Neuro/Psych negative neurological ROS  negative psych ROS   GI/Hepatic negative GI ROS, Neg liver ROS,,,  Endo/Other  diabetes    Renal/GU      Musculoskeletal   Abdominal   Peds  Hematology negative hematology ROS (+)   Anesthesia Other Findings Patient's potassium is 2.7. Surgeon has declared the case emergent and we will proceed.   Past Medical History: No date: Alcohol abuse No date: Diabetes mellitus without complication (HCC)  History reviewed. No pertinent surgical history.  BMI    Body Mass Index: 22.15 kg/m      Reproductive/Obstetrics negative OB ROS                             Anesthesia Physical Anesthesia Plan  ASA: 3 and emergent  Anesthesia Plan: General ETT   Post-op Pain Management:    Induction: Intravenous and Rapid sequence  PONV Risk Score and Plan: 2 and Ondansetron , Dexamethasone  and Midazolam   Airway Management Planned: Oral ETT and LMA  Additional Equipment:   Intra-op Plan:   Post-operative Plan: Extubation in OR  Informed Consent: I have reviewed the patients History and Physical, chart, labs and discussed the procedure including the risks, benefits and alternatives for the proposed anesthesia with the patient or authorized representative who has indicated his/her understanding and acceptance.     Dental Advisory Given  Plan Discussed with: Anesthesiologist, CRNA and Surgeon  Anesthesia Plan Comments: (Patient consented for  risks of anesthesia including but not limited to:  - adverse reactions to medications - damage to eyes, teeth, lips or other oral mucosa - nerve damage due to positioning  - sore throat or hoarseness - Damage to heart, brain, nerves, lungs, other parts of body or loss of life  Patient voiced understanding and assent.)       Anesthesia Quick Evaluation

## 2023-06-19 NOTE — H&P (Addendum)
 History and Physical    CAN DETTMAN NWG:[id number] DOB: [birth year] DOA: 06/19/2023  PCP: Pcp, No (Confirm with patient/family/NH records and if not entered, this has to be entered at [facility] point of entry) Patient coming from: Home  I have personally briefly reviewed patient's old medical records in [facility]  Chief Complaint: Feeling ok  HPI: Richard Estes is a 63 y.o. 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  Patient was hospitalized last week for rectal bleeding, and worsening of baseline anemia, underwent ligation of rectal ulcer and rectal polyp biopsy and patient was given PRBC x 1, hemoglobin stabilized and patient sent home.  Since then, patient has been stable until yesterday evening, patient went to the bathroom and started to pass bright red blood nonpalpable called EMS.  Denied any abdominal pain no nausea vomiting or diarrhea.  On arrival in the ED, blood work found hemoglobin 8.4 compared to baseline 10.0 of 6 days ago, and patient continued to experience rectal bleeding, and patient taken to the OR for emergency surgical intervention.  During the procedure, it was found that patient active ulcer bleeding, which was cauterized and suture ligation of an ulcer bleeding.  Patient was given PRBC x 2 during the procedure.  He tolerated the surgery well and pain is controlled and no more bleeding.  Review of Systems: As per HPI otherwise 14 point review of systems negative.    Past Medical History:  Diagnosis Date   Alcohol abuse    Diabetes mellitus without complication (HCC)     Past Surgical History:  Procedure Laterality Date   RECTAL EXAM UNDER ANESTHESIA N/A 06/11/2023   Procedure: EXAM UNDER ANESTHESIA, RECTUM;  Surgeon: Eldred Grego, MD;  Location: [facility] ORS;  Service: General;  Laterality: N/A;     reports that he has been  smoking cigarettes. He has never used smokeless tobacco. He reports current alcohol use of about 22.0 standard drinks of alcohol per week. He reports that he does not use drugs.  Allergies  Allergen Reactions   Bee Venom     Family History  Problem Relation Age of Onset   Arthritis Mother      Prior to Admission medications   Medication Sig Start Date End Date Taking? Authorizing Provider  albuterol  (VENTOLIN  HFA) 108 (90 Base) MCG/ACT inhaler Inhale 2 puffs into the lungs every 6 (six) hours as needed for shortness of breath or wheezing. 12/23/22 12/23/23  [provider]  Blood Glucose Monitoring Suppl (BLOOD GLUCOSE MONITOR SYSTEM) w/Device KIT Use 3 (three) times daily to check blood sugar 06/12/23   Sheril Dines, MD  Blood Glucose Monitoring Suppl (TRUE METRIX METER) w/Device KIT use to check blood glucose 10/08/22   Iloabachie, Chioma E, NP  EPINEPHrine  0.3 mg/0.3 mL IJ SOAJ injection Inject 0.3 mg into the muscle as needed for anaphylaxis. 06/18/23   Iloabachie, Chioma E, NP  glipiZIDE  (GLUCOTROL  XL) 10 MG 24 hr tablet Take 1 tablet (10 mg total) by mouth daily with breakfast. 06/18/23   Debbrah Faith, NP  Glucose Blood (BLOOD GLUCOSE TEST STRIPS) STRP Use 3 (three) times daily. Use as directed to check blood sugar. 06/12/23   Sheril Dines, MD  Glucose Blood (BLOOD GLUCOSE TEST STRIPS) STRP Use as directed to check blood sugar 4 (four) times daily. 10/08/22   Iloabachie, Chioma E, NP  Lancet Device MISC Use 3 (three) times daily to  check blood sugar 06/12/23   Sheril Dines, MD  TRUEplus Lancets 33G MISC Use as directed to check blood sugar up 4 (four) times daily. 10/08/22   Iloabachie, Chioma E, NP  Lancets MISC Use 3 (three) times daily. Use as directed to check blood sugar. 06/12/23   Sheril Dines, MD  metFORMIN  (GLUCOPHAGE ) 850 MG tablet Take 1 tablet (850 mg total) by mouth 2 (two) times daily with a meal. 06/18/23   Debbrah Faith, NP    Physical Exam: Vitals:   06/19/23 0632 06/19/23  0659 06/19/23 0730 06/19/23 0745  BP: (!) 141/103 (!) 145/94 (!) 139/97 138/87  Pulse: 89 84 77 72  Resp: 11 12 20 10   Temp:      TempSrc:      SpO2: 99% 99% 100% 100%  Weight:      Height:        Constitutional: NAD, calm, comfortable Vitals:   06/19/23 0632 06/19/23 0659 06/19/23 0730 06/19/23 0745  BP: (!) 141/103 (!) 145/94 (!) 139/97 138/87  Pulse: 89 84 77 72  Resp: 11 12 20 10   Temp:      TempSrc:      SpO2: 99% 99% 100% 100%  Weight:      Height:       Eyes: PERRL, lids and conjunctivae normal ENMT: Mucous membranes are moist. Posterior pharynx clear of any exudate or lesions.Normal dentition.  Neck: normal, supple, no masses, no thyromegaly Respiratory: clear to auscultation bilaterally, no wheezing, no crackles. Normal respiratory effort. No accessory muscle use.  Cardiovascular: Regular rate and rhythm, no murmurs / rubs / gallops. No extremity edema. 2+ pedal pulses. No carotid bruits.  Abdomen: no tenderness, no masses palpated. No hepatosplenomegaly. Bowel sounds positive.  Musculoskeletal: no clubbing / cyanosis. No joint deformity upper and lower extremities. Good ROM, no contractures. Normal muscle tone.  Skin: no rashes, lesions, ulcers. No induration Neurologic: CN 2-12 grossly intact. Sensation intact, DTR normal. Strength 5/5 in all 4.  Psychiatric: Normal judgment and insight. Alert and oriented x 3. Normal mood.     Labs on Admission: I have personally reviewed following labs and imaging studies  CBC: Recent Labs  Lab 06/13/23 0443 06/19/23 0141 06/19/23 0743  WBC 9.7 8.5 9.9  NEUTROABS  --  4.4  --   HGB 10.0* 8.4* 11.3*  HCT 28.7* 25.3* 34.4*  MCV 88.0 92.3 90.3  PLT 185 250 270   Basic Metabolic Panel: Recent Labs  Lab 06/13/23 0443 06/19/23 0141 06/19/23 0715  NA 134* 129* 133*  K 3.9 2.7* 3.5  CL 100 92* 94*  CO2 26 26 20*  GLUCOSE 212* 140* 174*  BUN 9 7* 7*  CREATININE 0.48* 0.64 0.61  CALCIUM 8.2* 8.2* 8.2*  MG  --  1.3*  1.3*  PHOS 3.4  --  4.2   GFR: Estimated Creatinine Clearance: 89.9 mL/min (by C-G formula based on SCr of 0.61 mg/dL). Liver Function Tests: Recent Labs  Lab 06/13/23 0443 06/19/23 0141  AST  --  114*  ALT  --  102*  ALKPHOS  --  76  BILITOT  --  0.6  PROT  --  6.6  ALBUMIN 2.7* 2.9*   No results for input(s): "LIPASE", "AMYLASE" in the last 168 hours. No results for input(s): "AMMONIA" in the last 168 hours. Coagulation Profile: Recent Labs  Lab 06/19/23 0141  INR 1.1   Cardiac Enzymes: No results for input(s): "CKTOTAL", "CKMB", "CKMBINDEX", "TROPONINI" in the last 168 hours. BNP (last 3  results) No results for input(s): "PROBNP" in the last 8760 hours. HbA1C: No results for input(s): "HGBA1C" in the last 72 hours. CBG: Recent Labs  Lab 06/12/23 2016 06/13/23 0741 06/13/23 1146 06/19/23 0403 06/19/23 0637  GLUCAP 136* 224* 278* 155* 139*   Lipid Profile: No results for input(s): "CHOL", "HDL", "LDLCALC", "TRIG", "CHOLHDL", "LDLDIRECT" in the last 72 hours. Thyroid Function Tests: No results for input(s): "TSH", "T4TOTAL", "FREET4", "T3FREE", "THYROIDAB" in the last 72 hours. Anemia Panel: No results for input(s): "VITAMINB12", "FOLATE", "FERRITIN", "TIBC", "IRON", "RETICCTPCT" in the last 72 hours. Urine analysis:    Component Value Date/Time   COLORURINE YELLOW (A) 06/10/2023 2051   APPEARANCEUR CLEAR (A) 06/10/2023 2051   APPEARANCEUR Clear 06/11/2014 0959   LABSPEC 1.029 06/10/2023 2051   LABSPEC 1.023 06/11/2014 0959   PHURINE 7.0 06/10/2023 2051   GLUCOSEU >=500 (A) 06/10/2023 2051   GLUCOSEU Negative 06/11/2014 0959   HGBUR NEGATIVE 06/10/2023 2051   BILIRUBINUR NEGATIVE 06/10/2023 2051   BILIRUBINUR Negative 06/11/2014 0959   KETONESUR NEGATIVE 06/10/2023 2051   PROTEINUR NEGATIVE 06/10/2023 2051   NITRITE NEGATIVE 06/10/2023 2051   LEUKOCYTESUR NEGATIVE 06/10/2023 2051   LEUKOCYTESUR Negative 06/11/2014 0959    Radiological Exams on  Admission: CT ANGIO GI BLEED Result Date: 06/19/2023 EXAM: CTA ABDOMEN AND PELVIS WITH AND WITHOUT CONTRAST 06/19/2023 02:01:53 AM TECHNIQUE: CTA images of the abdomen and pelvis with and without intravenous contrast. Three-dimensional MIP/volume rendered formations were performed. Automated exposure control, iterative reconstruction, and/or weight based adjustment of the mA/kV was utilized to reduce the radiation dose to as low as reasonably achievable. COMPARISON: 06/10/2023 CLINICAL HISTORY: Acute severe hematochezia after recent rectal surgery. BIB ems for rectal bleeding, [facility] was not bleeding at that time. Had Sx last week for internal hemorrhoids. Went to have BM about 1hr pta per pt "nothing but blood". Pt had to have blood transfusion last week after sx. During triage pt states "I can just feel it coming out." FINDINGS: VASCULATURE: Atherosclerotic calcifications of the abdominal aorta and branch vessels, although patent. 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 GI BLEED: Mild rectal wall thickening with active extravasation in the lower rectum following contrast administration (series 9 / image 202). AORTA: No acute finding. No abdominal aortic aneurysm. No dissection. CELIAC TRUNK: No acute finding. No occlusion or significant stenosis. SUPERIOR MESEMTRIC ARTERY: No acute finding. No occlusion or significant stenosis. INFERIOR MESENTERIC ARTERY: No acute finding. No occlusion or significant stenosis. RENAL ARTERIES: No acute finding. No occlusion or significant stenosis. 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 ABDOMEN/PELVIS: LOWER CHEST: Visualized portion of the lower chest demonstrates no acute abnormality. HEPATOBILIARY: Liver enhances normally without evidence of intrahepatic biliary ductal dilatation. The gallbladder is unremarkable. SPLEEN: The spleen is unremarkable. PANCREAS: The pancreas is unremarkable. ADRENAL GLANDS: Bilateral adrenal glands  demonstrate no acute abnormality. KIDNEYS, URETERS AND BLADDER: No stones in the kidneys or ureters. No evidence of hydronephrosis. No evidence of perinephric or periureteral stranding. Urinary bladder is unremarkable. GI AND BOWEL: Stomach and duodenal sweep demonstrate no acute abnormality. There is no evidence of bowel obstruction. No evidence of abnormal bowel wall thickening or distension. No evidence of acute appendicitis. REPRODUCTIVE: Reproductive organs are unremarkable. PERITONEUM AND RETROPERITONEUM: No evidence of ascites or free air. Aorta is normal in caliber. LYMPH NODES: No evidence of lymphadenopathy. BONES AND SOFT TISSUES: No acute abnormality of the bones. No acute soft tissue abnormality. IMPRESSION: 1. Mild rectal wall thickening with  active lower rectal bleeding. Electronically signed by: Zadie Herter MD 06/19/2023 02:15 AM EDT RP Workstation: [id number]    EKG: Independently reviewed.   Assessment/Plan Principal Problem:   Lower GI bleeding Active Problems:   Acute GI bleeding   Anal ulcer  (please populate well all problems here in Problem List. (For example, if patient is on BP meds at home and you resume or decide to hold them, it is a problem that needs to be her. Same for CAD, COPD, HLD and so on)  Acute blood loss anemia Recurrent rectal polyp ulcer bleeding - Status post anal polyp ulcer sutured and ligation - Status post PRBC x 2, repeat H&H this morning showed hemoglobin improved to 11.2 - Discussed with Dr. Mauri Sous, recommend diet, trend H&H -Normal iron study on last admission - Review of pathology study from last admission biopsy of the anal polyps ulcer, showed CMV negative and tubular adenoma without high-grade dysplasia.  Hypokalemia, acute on chronic - IV and p.o. replacement - Recheck level this afternoon  Hypomagnesemia - IV replacement  IIDM -SSI - Review of record from last admission showed patient most recently A1c 14.3 and unfortunately  patient is homeless and unable to accommodate refrigerate insulin   Hyponatremia - Chronic, level stable  Question of medical nonadherence -Will discussed with patient regarding the importance of adherence to current medication to avoid complications from diabetes  DVT prophylaxis: SCD Code Status: Full code Family Communication: None at bedside Disposition Plan: Expect less than 2 midnight hospital stay Consults called: General Surgeon Admission status: PCU observation   [place] MD [facility] Pager [phone]  06/19/2023, 9:37 AM  This

## 2023-06-19 NOTE — Transfer of Care (Signed)
 Immediate Anesthesia Transfer of Care Note  Patient: Richard Estes  Procedure(s) Performed: EXAM UNDER ANESTHESIA, RECTUM  Patient Location: PACU  Anesthesia Type:General  Level of Consciousness: awake  Airway & Oxygen Therapy: Patient Spontanous Breathing and Patient connected to face mask oxygen  Post-op Assessment: Report given to RN and Post -op Vital signs reviewed and stable  Post vital signs: Reviewed  Last Vitals:  Vitals Value Taken Time  BP    Temp    Pulse    Resp 12 06/19/23 0544  SpO2    Vitals shown include unfiled device data.  Last Pain:  Vitals:   06/19/23 0317  TempSrc: Tympanic  PainSc:          Complications: No notable events documented.

## 2023-06-19 NOTE — ED Triage Notes (Addendum)
 BIB ems for rectal bleeding, was [facility] was not bleeding at that time. Had Sx last week for internal hemorrhoids. Went to have BM about 1hr pta per pt "nothing but blood"  Pt had to have blood transfusion last week after sx.   During triage pt states "I can just feel it coming out."  Denies dizziness, shob, pain

## 2023-06-19 NOTE — Discharge Instructions (Addendum)
 General Surgery Discharge Instructions: Very important to avoid constipation.  Please take Miralax  once daily or stool softener with goal to have a soft bowel movement daily without straining.   Do not insert anything into the rectum/anal canal while the ulcer is healing.  It is very important to avoid any additional injury. Please refrain from drinking alcohol as this can contribute to further bleeding. Please avoid NSAIDs such as ibuprofen (Advil), naproxen (Aleve), Aspirin, Goody's powder, BC powder.  These can also contribute to further bleeding.

## 2023-06-19 NOTE — Progress Notes (Signed)
 Patient resting in bed without c/o's.  Lunch tray provided. No rectal bleeding noted. Waiting on bed assignment.

## 2023-06-19 NOTE — ED Provider Notes (Signed)
 [facility] Provider Note    Event Date/Time   First MD Initiated Contact with Patient 06/19/23 0103     (approximate)   History   Rectal Bleeding   HPI Richard Estes is a 63 y.o. male whose medical history is notable for chronic alcohol abuse, generally uncontrolled diabetes, recent diagnosis of hemorrhoids, and recent severe rectal arterial bleed requiring surgery.  This occurred about a week ago.  The patient is also unhoused which is a complicating factor in his ongoing medical care.  The patient presents tonight for a recurrence of the rectal bleeding.  He states that he had a small amount of rectal bleeding after the surgery but then it stopped.  He has been fine for several days but tonight he went to have a bowel movement and had another large volume output of bright red blood as well as clots.  This is similar to what happened to him the last time when he required emergent surgery and blood transfusion.  He said he is not in pain except it being tender right around his anus.  He has no abdominal pain, no nausea or vomiting, no lightheadedness, dizziness, nor passing out.  However, he said he has no control over the blood that is coming out and anytime he accidentally coughs or just do the active breathing it causes more blood to come out.     Physical Exam   Triage Vital Signs: ED Triage Vitals  Encounter Vitals Group     BP --      Systolic BP Percentile --      Diastolic BP Percentile --      Pulse Rate 06/19/23 0103 95     Resp 06/19/23 0103 20     Temp 06/19/23 0103 98.1 F (36.7 C)     Temp Source 06/19/23 0103 Oral     SpO2 06/19/23 0103 100 %     Weight 06/19/23 0104 66.4 kg (146 lb 6.2 oz)     Height 06/19/23 0104 1.753 m (5\' 9" )     Head Circumference --      Peak Flow --      Pain Score 06/19/23 0104 0     Pain Loc --      Pain Education --      Exclude from Growth Chart --     Most recent vital signs: Vitals:   06/19/23  0232 06/19/23 0317  BP: (!) 140/96 (!) 136/99  Pulse: 91 88  Resp: (!) 21 16  Temp:  98.7 F (37.1 C)  SpO2: 100% 100%    General: Awake, appearance of chronic illness but not acutely in distress, nontoxic appearance, patient is fully aware, oriented, and even making jokes with me. CV:  Good peripheral perfusion.  Regular rate and rhythm. Resp:  Normal effort. Speaking easily and comfortably, no accessory muscle usage nor intercostal retractions.   Abd:  No distention.  No tenderness to palpation of the abdomen. Other:  Rectal exam notable for external hemorrhoid, nonbleeding, but a steady stream of bright red blood coming from his rectum.  Some tenderness to palpation of the anus.  When the patient bears down and then occasionally just without any specific action, a large volume of blood clots and additional blood will come out of his rectum and anus.   ED Results / Procedures / Treatments   Labs (all labs ordered are listed, but only abnormal results are displayed) Labs Reviewed  COMPREHENSIVE METABOLIC PANEL WITH GFR -  Abnormal; Notable for the following components:      Result Value   Sodium 129 (*)    Potassium 2.7 (*)    Chloride 92 (*)    Glucose, Bld 140 (*)    BUN 7 (*)    Calcium 8.2 (*)    Albumin 2.9 (*)    AST 114 (*)    ALT 102 (*)    All other components within normal limits  CBC WITH DIFFERENTIAL/PLATELET - Abnormal; Notable for the following components:   RBC 2.74 (*)    Hemoglobin 8.4 (*)    HCT 25.3 (*)    All other components within normal limits  MAGNESIUM  - Abnormal; Notable for the following components:   Magnesium  1.3 (*)    All other components within normal limits  PROTIME-INR  TYPE AND SCREEN  PREPARE RBC (CROSSMATCH)  PREPARE RBC (CROSSMATCH)     EKG  ED ECG REPORT I, Lynnda Sas, the attending physician, personally viewed and interpreted this ECG.  Date: 06/19/2023 EKG Time: 1:23 AM Rate: 92 Rhythm: normal sinus rhythm QRS Axis:  normal Intervals: normal ST/T Wave abnormalities: normal Narrative Interpretation: no evidence of acute ischemia    RADIOLOGY See ED course for details   PROCEDURES:  Critical Care performed: Yes, see critical care procedure note(s)  .Critical Care  Performed by: Lynnda Sas, MD Authorized by: Lynnda Sas, MD   Critical care provider statement:    Critical care time (minutes):  60   Critical care time was exclusive of:  Separately billable procedures and treating other patients   Critical care was necessary to treat or prevent imminent or life-threatening deterioration of the following conditions: acute arterial rectal bleeding requiring blood products.   Critical care was time spent personally by me on the following activities:  Development of treatment plan with patient or surrogate, evaluation of patient's response to treatment, examination of patient, obtaining history from patient or surrogate, ordering and performing treatments and interventions, ordering and review of laboratory studies, ordering and review of radiographic studies, pulse oximetry, re-evaluation of patient's condition and review of old charts .1-3 Lead EKG Interpretation  Performed by: Lynnda Sas, MD Authorized by: Lynnda Sas, MD     Interpretation: normal     ECG rate:  95   ECG rate assessment: normal     Rhythm: sinus rhythm     Ectopy: none     Conduction: normal       IMPRESSION / MDM / ASSESSMENT AND PLAN / ED COURSE  I reviewed the triage vital signs and the nursing notes.                              Differential diagnosis includes, but is not limited to, recurrence of rectal ulceration, neoplasm, hemorrhoidal bleed, diverticular bleed.  Patient's presentation is most consistent with acute presentation with potential threat to life or bodily function.  Labs/studies ordered: Type and screen, CMP, pro time-INR, magnesium  level, CBC with differential, CTA GI bleed  protocol  Interventions/Medications given:  Medications  0.9 %  sodium chloride  infusion (has no administration in time range)  0.9 %  sodium chloride  infusion (has no administration in time range)  potassium chloride  10 mEq in 100 mL IVPB (has no administration in time range)  tranexamic acid  (CYKLOKAPRON ) IVPB 1,000 mg (0 mg Intravenous Stopped 06/19/23 0153)  lactated ringers bolus 1,000 mL (1,000 mLs Intravenous New Bag/Given 06/19/23 0201)  iohexol  (OMNIPAQUE ) 350 MG/ML  injection 100 mL (100 mLs Intravenous Contrast Given 06/19/23 0145)    (Note:  hospital course my include additional interventions and/or labs/studies not listed above.)   Patient is passing a large volume of blood per rectum.  I reviewed the medical records including the operative note by Dr. Charmel Cooter written on 06/11/23 and the d/c summary written by Dr. Leory Rands on 06/13/23, confirming the history is provided by the patient, the presence of the acute arterial bleed in the rectum, etc.  The patient's labs are reassuring tonight in general except that his hemoglobin has dropped about a point and a half since his last hemoglobin, which was obtained after his blood transfusion(s) in the hospital.  I anticipate the patient will need emergent surgical intervention but I will verify the presence of active extravasation and arterial bleeding through another CTA GI bleeding protocol.  I have asked the CT technologist to proceed emergently without waiting for labs given that the patient's creatinine was fine within the last week.  Patient is stable for CT scan but will likely need blood products as soon as he comes back.  Before the CT scan I am giving him TXA 1 g IV.  He verified with me that he is not on blood thinners and I see no evidence of thinners in his medical history.  The patient is on the cardiac monitor to evaluate for evidence of arrhythmia and/or significant heart rate changes.   Clinical Course as of 06/19/23 0404  Fri Jun 19, 2023  0234 I personally viewed and interpreted the patient's CTA and I can appreciate active bleeding in the rectum.  The radiologist interpreted the study and also confirmed the active extravasation.  I consulted immediately by phone with Dr. Mauri Sous who is on-call for general surgery.  I explained the information.  He reviewed the case and the CT scan and agreed he needs to take the patient emergently to the operating room.  He lives about 45 minutes away but he will call the operating room and let them know to prepare.  I will consult the hospitalist to admit the patient given his other chronic medical issues, similar to what was done last time.  I am updating the patient as well. [CF]  0235 Given the patient's active bleeding and the fact it will be at least 45 minutes before the surgeon can be here, I am proceeding with emergency blood administration of 1 unit with a plan for another unit after the type and screen is ready and with 2 units prepared ahead.  Received consent from the patient. [CF]  0402 (Delayed documentation due to multiple critical patients in the ED) patient was hemodynamically stable at the time of transfer to the operating room awaiting Dr. Carmen Chol arrival.  He had received blood products as documented in the nursing documentation and his blood pressure was stable at 136/99 and patient remained awake and alert with no change in mental status.  I initially consulted Dr. Achilles Holes with the hospitalist service, but upon further reflection and a three-way conversation between Dr. Achilles Holes, Dr. Mauri Sous, and myself, we agreed that it makes the most sense for Dr. Mauri Sous to consult the hospitalist service after the patient is out of the operating room and his needed level of care (hospitalist service versus PCCM team) is more clear.  I removed my consult to the hospitalist service and Dr. Mauri Sous will handle the admission plans either via himself or the hospitalist service in the postop  setting. [CF]  Clinical Course User Index [CF] Lynnda Sas, MD     FINAL CLINICAL IMPRESSION(S) / ED DIAGNOSES   Final diagnoses:  Acute GI bleeding  Chronic alcohol abuse  Hypokalemia  Hyponatremia     Rx / DC Orders   ED Discharge Orders     None        Note:  This document was prepared using Dragon voice recognition software and may include unintentional dictation errors.   Lynnda Sas, MD 06/19/23 [phone]

## 2023-06-19 NOTE — Anesthesia Procedure Notes (Signed)
 Procedure Name: Intubation Date/Time: 06/19/2023 4:20 AM  Performed by: Kavin Parsley, CRNAPre-anesthesia Checklist: Patient identified, Patient being monitored, Timeout performed, Emergency Drugs available and Suction available Patient Re-evaluated:Patient Re-evaluated prior to induction Oxygen Delivery Method: Circle system utilized Preoxygenation: Pre-oxygenation with 100% oxygen Induction Type: IV induction and Rapid sequence Laryngoscope Size: 3 and McGrath Grade View: Grade I Tube type: Oral Tube size: 7.5 mm Number of attempts: 1 Airway Equipment and Method: Stylet and Video-laryngoscopy Placement Confirmation: ETT inserted through vocal cords under direct vision, positive ETCO2 and breath sounds checked- equal and bilateral Secured at: 21 cm Tube secured with: Tape Dental Injury: Teeth and Oropharynx as per pre-operative assessment

## 2023-06-19 NOTE — Plan of Care (Signed)

## 2023-06-19 NOTE — Op Note (Signed)
 Procedure Date:  06/19/2023  Pre-operative Diagnosis:  Active lower GI bleeding  Post-operative Diagnosis: Active lower GI bleeding from anal ulcer  Procedure:  Exam under Anesthesia, cauterization and suture ligation of anal ulcer bleeding.  Surgeon:  Marene Shape, MD  Anesthesia:  General endotracheal  Estimated Blood Loss:  10 ml  Specimens:  None  Complications:  None  Indications for Procedure:  This is a 63 y.o. male with a history of prior anal ulcer bleeding, s/p ligation with Dr. Dortha Gauss on 06/11/23, presenting with recurrent profuse bleeding.  The risks of bleeding, infection, bowel injury, and need for further procedures were all discussed with the patient and he was willing to proceed.   Description of Procedure: The patient was correctly identified in the preoperative area and brought into the operating room.  The patient was placed supine with VTE prophylaxis in place.  Appropriate time-outs were performed.  Anesthesia was induced and the patient was intubated.  Appropriate antibiotics were infused.  The patient was then placed in high lithotomy position.   The perianal area was prepped and draped in usual sterile fashion.  The sphincter was digitally dilated.  Then the anoscope was inserted and the anal canal was evaluated, revealing copious amount of bright red blood and blood clots in the anal canal and lower rectum.  This was suctioned and irrigated.  We were then able to see prior anal ulcer in the left lateral aspect of the distal anal canal.  He had two sutures placed in his prior surgery, but had an area in the proximal and posterior edge of this ulcer which was actively bleeding.  The bivalve retractor was inserted.  LigaSure and Bovie cautery were used to cauterize the friable edges and bleeding.  This was then reinforced with three 2-0 Vicryl sutures along the posterior and proximal edges.  No further bleeding was noted.  The anal canal was then irrigated with  saline, no bleeding was noted.  40 ml of Exparel  solution with 0.5% bupivacaine  and epi was infiltrated into the perianal area and as bilateral pudendal blocks.  A large gelfoam gauze was rolled and inserted into the anal canal for further hemostasis.  The perianal area was cleaned and dressed with fluffed gauze and mesh underwear.  The patient was emerged from anesthesia and extubated and brought to the recovery room for further management.  The patient tolerated the procedure well and all counts were correct at the end of the case.   Marene Shape, MD

## 2023-06-20 LAB — BASIC METABOLIC PANEL WITH GFR
Anion gap: 9 (ref 5–15)
BUN: 11 mg/dL (ref 8–23)
CO2: 24 mmol/L (ref 22–32)
Calcium: 8 mg/dL — ABNORMAL LOW (ref 8.9–10.3)
Chloride: 98 mmol/L (ref 98–111)
Creatinine, Ser: 0.56 mg/dL — ABNORMAL LOW (ref 0.61–1.24)
GFR, Estimated: >60 mL/min (ref >60)
Glucose, Bld: 228 mg/dL — ABNORMAL HIGH (ref 70–99)
Potassium: 4.8 mmol/L (ref 3.5–5.1)
Sodium: 131 mmol/L — ABNORMAL LOW (ref 135–145)

## 2023-06-20 LAB — TYPE AND SCREEN
ABO/RH(D): A POS
Antibody Screen: NEGATIVE
Unit division: 0
Unit division: 0
Unit division: 0
Unit division: 0

## 2023-06-20 LAB — BPAM RBC
Blood Product Expiration Date: 202505222359
Blood Product Expiration Date: 202505222359
Blood Product Expiration Date: 202506042359
Blood Product Expiration Date: 202506042359
ISSUE DATE / TIME: 202505020240
ISSUE DATE / TIME: 202505020240
Unit Type and Rh: 5100
Unit Type and Rh: 5100
Unit Type and Rh: 6200
Unit Type and Rh: 6200

## 2023-06-20 LAB — PREPARE RBC (CROSSMATCH)

## 2023-06-20 LAB — GLUCOSE, CAPILLARY
Glucose-Capillary: 195 mg/dL — ABNORMAL HIGH (ref 70–99)
Glucose-Capillary: 257 mg/dL — ABNORMAL HIGH (ref 70–99)
Glucose-Capillary: 209 mg/dL — ABNORMAL HIGH (ref 70–99)
Glucose-Capillary: 148 mg/dL — ABNORMAL HIGH (ref 70–99)

## 2023-06-20 LAB — MAGNESIUM: Magnesium: 1.8 mg/dL (ref 1.7–2.4)

## 2023-06-20 LAB — CBC
HCT: 32.8 % — ABNORMAL LOW (ref 39.0–52.0)
Hemoglobin: 10.8 g/dL — ABNORMAL LOW (ref 13.0–17.0)
MCH: 29.3 pg (ref 26.0–34.0)
MCHC: 32.9 g/dL (ref 30.0–36.0)
MCV: 88.9 fL (ref 80.0–100.0)
Platelets: UNDETERMINED 10*3/uL (ref 150–400)
RBC: 3.69 MIL/uL — ABNORMAL LOW (ref 4.22–5.81)
RDW: 14.2 % (ref 11.5–15.5)
WBC: 11.9 10*3/uL — ABNORMAL HIGH (ref 4.0–10.5)
nRBC: 0 % (ref 0.0–0.2)

## 2023-06-20 MED ORDER — HYDROMORPHONE HCL 1 MG/ML IJ SOLN: 0.5000 mg | INTRAMUSCULAR | Status: DC | PRN

## 2023-06-20 NOTE — Progress Notes (Signed)
 Patient ID: Richard Estes, male   DOB: Feb 29, 1960, 63 y.o.   MRN: [id number]     SURGICAL PROGRESS NOTE   Hospital Day(s): 0.   Interval History: Patient seen and examined, no acute events or new complaints overnight.  No rectal pain.  Endorses minimal bleeding on bowel movement.  No profuse bleeding.  No pain radiation.  No alleviating or aggravating factors.  Vital signs in last 24 hours: [min-max] current  Temp:  [97.7 F (36.5 C)-98.9 F (37.2 C)] 97.8 F (36.6 C) (05/03 0812) Pulse Rate:  [70-99] 70 (05/03 0812) Resp:  [11-20] 16 (05/03 0812) BP: (117-138)/(83-99) 127/99 (05/03 0812) SpO2:  [99 %-100 %] 100 % (05/03 0812)     Height: 5\' 9"  (175.3 cm) Weight: 66.4 kg BMI (Calculated): 21.61   Physical Exam:  Constitutional: alert, cooperative and no distress  Respiratory: breathing non-labored at rest  Cardiovascular: regular rate and sinus rhythm  Gastrointestinal: soft, non-tender, and non-distended Rectal: Rectal exam without blood.  Dried blood on external gauze.  Adequate rectal tone.  Labs:     Latest Ref Rng & Units 06/20/2023    4:05 AM 06/19/2023    4:02 PM 06/19/2023    7:43 AM  CBC  WBC 4.0 - 10.5 K/uL 11.9   9.9   Hemoglobin 13.0 - 17.0 g/dL 03.5  00.9  38.1   Hematocrit 39.0 - 52.0 % 32.8  31.4  34.4   Platelets 150 - 400 K/uL PLATELET CLUMPS NOTED ON SMEAR, UNABLE TO ESTIMATE   270       Latest Ref Rng & Units 06/19/2023    4:02 PM 06/19/2023    7:15 AM 06/19/2023    1:41 AM  CMP  Glucose 70 - 99 mg/dL  829  937   BUN 8 - 23 mg/dL  7  7   Creatinine 1.69 - 1.24 mg/dL  6.78  9.38   Sodium 101 - 145 mmol/L  133  129   Potassium 3.5 - 5.1 mmol/L 4.6  3.5  2.7   Chloride 98 - 111 mmol/L  94  92   CO2 22 - 32 mmol/L  20  26   Calcium 8.9 - 10.3 mg/dL  8.2  8.2   Total Protein 6.5 - 8.1 g/dL   6.6   Total Bilirubin 0.0 - 1.2 mg/dL   0.6   Alkaline Phos 38 - 126 U/L   76   AST 15 - 41 U/L   114   ALT 0 - 44 U/L   102     Imaging studies: No new pertinent  imaging studies   Assessment/Plan:  63 y.o. male with rectal bleeding 1 Day Post-Op s/p exam under anesthesia with bleeding control.  -Today patient with physical exam without sign of recurrent bleeding. - Hemoglobin today is 10.8 which was 10.6 yesterday.  These are another sign of bleeding control - Glucose much better controlled than previous admission - Patient can be discharged from surgical standpoint if medically stable  Lucila Rye, MD

## 2023-06-20 NOTE — Progress Notes (Signed)
 Progress Note   Patient: Richard Estes BJY:[id number] DOB: 02/28/1960 DOA: 06/19/2023     0 DOS: the patient was seen and examined on 06/20/2023   Brief hospital course: HPI on admission: " Eren D Agerton is a 63 y.o. 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   Patient was hospitalized last week for rectal bleeding, and worsening of baseline anemia, underwent ligation of rectal ulcer and rectal polyp biopsy and patient was given PRBC x 1, hemoglobin stabilized and patient sent home.  Since then, patient has been stable until yesterday evening, patient went to the bathroom and started to pass bright red blood nonpalpable called EMS. ..." See H&P for full HPI on admission & ED course.  Patient was admitted and General Surgery consulted. 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 --General surgery following --Taken to OR on admission 5/2 - see Op Note --Trend Hbg and transfuse if < 7 or if having recurrent rapid blood loss and hemodynamic changes   Hypokalemia, acute on chronic - replaced on admission --Monitor BMP, replace K as needed   Hypomagnesemia - replaced on admission --Monitor Mg and replace as needed   Insulin -dependent type 2 diabetes Last A1c 14.3% (during recent admission) Unfortunately patient is homeless and unable to accommodate refrigerate insulin  --Sliding Scale Novolog    Hyponatremia - Chronic, stable --Monitor BMP   Question of medical nonadherence Dr. Jeane Miguel, admitting hospitalist, discussed with  patient regarding the importance of adherence to current medication to avoid complications from diabetes      Subjective: Pt resting in bed this AM on rounds. He reports being weak and tired today, otherwise feeling better.  No other acute complaints, just wants to rest today.   Physical Exam: Vitals:   06/19/23 1458 06/19/23 1953 06/20/23 0341 06/20/23 0812  BP: 131/86 122/83 (!) 138/97 (!) 127/99  Pulse: 80 72 79 70  Resp: 16 16 16 16   Temp: 98.9 F (37.2 C) 98.3 F (36.8 C) 97.8 F (36.6 C) 97.8 F (36.6 C)  TempSrc: Oral Oral    SpO2: 100% 100% 100% 100%  Weight:      Height:       General exam: awake, alert, no acute distress, thin HEENT: moist mucus membranes, hearing grossly normal  Respiratory system: CTAB, no wheezes, rales or rhonchi, normal respiratory effort. Cardiovascular system: normal S1/S2, RRR,  no pedal edema.   Gastrointestinal system: soft, NT, ND Central nervous system: A&O x3. no gross focal neurologic deficits, normal speech Extremities: moves all, no edema, normal tone Skin: dry, intact, normal temperature Psychiatry: normal mood, congruent affect, judgement and insight appear normal   Data Reviewed:  Notable labs --   Na 131, glucose 228, Cr 0.56, Ca 8.0 WBC 11.9, Hbg 11.3 >> 10.6 >> 10.8 stable  Family Communication: None Present. Pt is able to update.  Disposition: Status is: Observation Patient remains admitted requiring further close monitoring for recurrent bleeding.   Planned Discharge Destination: Home    Time spent: 42 minutes  Author: Montey Apa, DO 06/20/2023 1:01 PM  For on call review  [email or web address]

## 2023-06-21 LAB — BASIC METABOLIC PANEL WITH GFR
Anion gap: 9 (ref 5–15)
BUN: 8 mg/dL (ref 8–23)
CO2: 25 mmol/L (ref 22–32)
Calcium: 8.4 mg/dL — ABNORMAL LOW (ref 8.9–10.3)
Chloride: 97 mmol/L — ABNORMAL LOW (ref 98–111)
Creatinine, Ser: 0.56 mg/dL — ABNORMAL LOW (ref 0.61–1.24)
GFR, Estimated: >60 mL/min (ref >60)
Glucose, Bld: 136 mg/dL — ABNORMAL HIGH (ref 70–99)
Potassium: 4.5 mmol/L (ref 3.5–5.1)
Sodium: 131 mmol/L — ABNORMAL LOW (ref 135–145)

## 2023-06-21 LAB — MAGNESIUM: Magnesium: 1.6 mg/dL — ABNORMAL LOW (ref 1.7–2.4)

## 2023-06-21 LAB — CBC
HCT: 32.7 % — ABNORMAL LOW (ref 39.0–52.0)
Hemoglobin: 10.9 g/dL — ABNORMAL LOW (ref 13.0–17.0)
MCH: 29.8 pg (ref 26.0–34.0)
MCHC: 33.3 g/dL (ref 30.0–36.0)
MCV: 89.3 fL (ref 80.0–100.0)
Platelets: 160 10*3/uL (ref 150–400)
RBC: 3.66 MIL/uL — ABNORMAL LOW (ref 4.22–5.81)
RDW: 13.4 % (ref 11.5–15.5)
WBC: 8.4 10*3/uL (ref 4.0–10.5)
nRBC: 0 % (ref 0.0–0.2)

## 2023-06-21 LAB — GLUCOSE, CAPILLARY: Glucose-Capillary: 146 mg/dL — ABNORMAL HIGH (ref 70–99)

## 2023-06-21 MED ORDER — MAGNESIUM SULFATE 2 GM/50ML IV SOLN
2.0000 g | Freq: Once | INTRAVENOUS | Status: AC
  Administered 2023-06-21: 2 g via INTRAVENOUS
  Filled 2023-06-21: qty 50

## 2023-06-21 NOTE — Discharge Summary (Signed)
 Physician Discharge Summary   Patient: Richard Estes MRN: [id number] DOB: May 18, 1960  Admit date:     06/19/2023  Discharge date: 06/21/2023  Discharge Physician: Montey Apa   PCP: Pcp, No   Recommendations at discharge:    Follow up with surgery as instructed / scheduled Follow up with Primary Care in 1-2 weeks Repeat CBC, CMP at follow up   Discharge Diagnoses: Principal Problem:   Lower GI bleeding Active Problems:   Rectal bleeding   Acute GI bleeding   Anal ulcer  Resolved Problems:   * No resolved hospital problems. *  Hospital Course:  HPI on admission: " Richard Estes is a 63 y.o. 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   Patient was hospitalized last week for rectal bleeding, and worsening of baseline anemia, underwent ligation of rectal ulcer and rectal polyp biopsy and patient was given PRBC x 1, hemoglobin stabilized and patient sent home.  Since then, patient has been stable until yesterday evening, patient went to the bathroom and started to pass bright red blood nonpalpable called EMS. ..." See H&P for full HPI on admission & ED course.   Patient was admitted and General Surgery consulted. Patient was taken to the OR, underwent cauterization and suture ligation of bleeding anal ulcer with Dr. Mauri Sous on 5/2.  Further hospital course and management as outlined below.  5/4 -- pt feels well this AM, no acute complaints.  No further episodes of bleeding, reports having a normal BM.  Pt is medically stable, cleared by surgery and requests discharge this morning.     Assessment and Plan:  Acute blood loss anemia Recurrent rectal polyp ulcer bleeding Status post anal polyp ulcer sutured and ligation Status post PRBC x 2 -- hemoglobin improved to 11.2 Normal iron study on last admission Pathology from last admission biopsy of  the anal polyps ulcer, showed CMV negative and tubular adenoma without high-grade dysplasia. --General surgery consulted --Taken to OR on admission 5/2 - see Op Note --No further bleeding post-op --Repeat CBC in 1-2 weeks   Hypokalemia, acute on chronic - replaced on admission --Monitor BMP, replace K as needed   Hypomagnesemia - replaced on admission --Monitor Mg and replace as needed   Insulin -dependent type 2 diabetes Last A1c 14.3% (during recent admission) Unfortunately patient is homeless and unable to accommodate refrigerate insulin  --Covered with Sliding Scale Novolog  during admission   Hyponatremia - Chronic, stable --Monitor BMP   Question of medical nonadherence Dr. Jeane Miguel, admitting hospitalist, discussed with patient regarding the importance of adherence to current medication to avoid complications from diabetes         Consultants: surgery Procedures performed: as above  Disposition: Home Diet recommendation:  CARB MODIFIED  DISCHARGE MEDICATION: Allergies as of 06/21/2023       Reactions   Other Anaphylaxis   Bee Venom         Medication List     TAKE these medications    albuterol  108 (90 Base) MCG/ACT inhaler Commonly known as: VENTOLIN  HFA Inhale 2 puffs into the lungs every 6 (six) hours as needed for shortness of breath or wheezing.   EPINEPHrine  0.3 mg/0.3 mL Soaj injection Commonly known as: EPI-PEN Inject 0.3 mg into the muscle as needed for anaphylaxis.   glipiZIDE  10 MG 24 hr tablet Commonly known as: GLUCOTROL  XL Take 1 tablet (10 mg total) by mouth daily  with breakfast.   metFORMIN  850 MG tablet Commonly known as: GLUCOPHAGE  Take 1 tablet (850 mg total) by mouth 2 (two) times daily with a meal.   True Metrix Blood Glucose Test test strip Generic drug: glucose blood Use as directed to check blood sugar 4 (four) times daily.   True Metrix Blood Glucose Test test strip Generic drug: glucose blood Use 3 (three) times daily.  Use as directed to check blood sugar.   True Metrix Meter w/Device Kit use to check blood glucose   True Metrix Meter w/Device Kit Use 3 (three) times daily to check blood sugar   TRUEdraw Lancing Device Misc Use 3 (three) times daily to check blood sugar   TRUEplus Lancets 33G Misc Use as directed to check blood sugar up 4 (four) times daily.   TRUEplus Lancets 33G Misc Use 3 (three) times daily. Use as directed to check blood sugar.        Follow-up Information     Piscoya, Volanda Gruber, MD Follow up in 2 week(s).   Specialty: General Surgery Contact information: [address] [place] [address] [phone]                Discharge Exam: Richard Estes Weights   06/19/23 0104  Weight: 66.4 kg   General exam: awake, alert, no acute distress Respiratory system: CTAB, no wheezes, rales or rhonchi, normal respiratory effort. Cardiovascular system: normal S1/S2, RRR, no pedal edema.   Gastrointestinal system: soft, NT, ND Central nervous system: A&O x4. no gross focal neurologic deficits, normal speech Extremities: moves all, no edema, normal tone Skin: dry, intact, normal temperature, normal color, No rashes, lesions or ulcers Psychiatry: normal mood, congruent affect, judgement and insight appear normal  Condition at discharge: stable  The results of significant diagnostics from this hospitalization (including imaging, microbiology, ancillary and laboratory) are listed below for reference.   Imaging Studies: CT ANGIO GI BLEED Result Date: 06/19/2023 EXAM: CTA ABDOMEN AND PELVIS WITH AND WITHOUT CONTRAST 06/19/2023 02:01:53 AM TECHNIQUE: CTA images of the abdomen and pelvis with and without intravenous contrast. Three-dimensional MIP/volume rendered formations were performed. Automated exposure control, iterative reconstruction, and/or weight based adjustment of the mA/kV was utilized to reduce the radiation dose to as low as reasonably achievable.  COMPARISON: 06/10/2023 CLINICAL HISTORY: Acute severe hematochezia after recent rectal surgery. BIB ems for rectal bleeding, [facility] was not bleeding at that time. Had Sx last week for internal hemorrhoids. Went to have BM about 1hr pta per pt "nothing but blood". Pt had to have blood transfusion last week after sx. During triage pt states "I can just feel it coming out." FINDINGS: VASCULATURE: Atherosclerotic calcifications of the abdominal aorta and branch vessels, although patent. 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 GI BLEED: Mild rectal wall thickening with active extravasation in the lower rectum following contrast administration (series 9 / image 202). AORTA: No acute finding. No abdominal aortic aneurysm. No dissection. CELIAC TRUNK: No acute finding. No occlusion or significant stenosis. SUPERIOR MESEMTRIC ARTERY: No acute finding. No occlusion or significant stenosis. INFERIOR MESENTERIC ARTERY: No acute finding. No occlusion or significant stenosis. RENAL ARTERIES: No acute finding. No occlusion or significant stenosis. 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 ABDOMEN/PELVIS: LOWER CHEST: Visualized portion of the lower chest demonstrates no acute abnormality. HEPATOBILIARY: Liver enhances normally without evidence of intrahepatic biliary ductal dilatation. The gallbladder is unremarkable. SPLEEN: The spleen is unremarkable. PANCREAS: The pancreas is unremarkable. ADRENAL GLANDS:  Bilateral adrenal glands demonstrate no acute abnormality. KIDNEYS, URETERS AND BLADDER: No stones in the kidneys or ureters. No evidence of hydronephrosis. No evidence of perinephric or periureteral stranding. Urinary bladder is unremarkable. GI AND BOWEL: Stomach and duodenal sweep demonstrate no acute abnormality. There is no evidence of bowel obstruction. No evidence of abnormal bowel wall thickening or distension. No evidence of acute appendicitis. REPRODUCTIVE: Reproductive  organs are unremarkable. PERITONEUM AND RETROPERITONEUM: No evidence of ascites or free air. Aorta is normal in caliber. LYMPH NODES: No evidence of lymphadenopathy. BONES AND SOFT TISSUES: No acute abnormality of the bones. No acute soft tissue abnormality. IMPRESSION: 1. Mild rectal wall thickening with active lower rectal bleeding. Electronically signed by: Zadie Herter MD 06/19/2023 02:15 AM EDT RP Workstation: [id number]   CT ANGIO GI BLEED Result Date: 06/10/2023 CLINICAL DATA:  Rectal bleeding. EXAM: CTA ABDOMEN AND PELVIS WITHOUT AND WITH CONTRAST TECHNIQUE: Multidetector CT imaging of the abdomen and pelvis was performed using the standard protocol during bolus administration of intravenous contrast. Multiplanar reconstructed images and MIPs were obtained and reviewed to evaluate the vascular anatomy. RADIATION DOSE REDUCTION: This exam was performed according to the departmental dose-optimization program which includes automated exposure control, adjustment of the mA and/or kV according to patient size and/or use of iterative reconstruction technique. CONTRAST:  OMNIPAQUE  IOHEXOL  350 MG/ML SOLN COMPARISON:  None Available. FINDINGS: VASCULAR Aorta: Normal caliber aorta without aneurysm, dissection, vasculitis or significant stenosis. There is mild calcified atherosclerotic disease throughout the aorta. Celiac: Patent without evidence of aneurysm, dissection, vasculitis or significant stenosis. SMA: Patent without evidence of aneurysm, dissection, vasculitis or significant stenosis. Renals: Both renal arteries are patent without evidence of aneurysm, dissection, vasculitis, fibromuscular dysplasia or significant stenosis. There is calcified atherosclerotic disease present. IMA: Patent without evidence of aneurysm, dissection, vasculitis or significant stenosis. Inflow: There is aneurysmal dilatation of the right common iliac artery measuring 2.3 cm. No dissection. There is calcified  atherosclerotic disease throughout. No significant stenosis. Proximal Outflow: Bilateral common femoral and visualized portions of the superficial and profunda femoral arteries are patent without evidence of aneurysm, dissection, vasculitis or significant stenosis. Veins: No obvious venous abnormality within the limitations of this arterial phase study. Review of the MIP images confirms the above findings. NON-VASCULAR Lower chest: No acute abnormality. Hepatobiliary: No focal liver abnormality is seen. No gallstones, gallbladder wall thickening, or biliary dilatation. Pancreas: Unremarkable. No pancreatic ductal dilatation or surrounding inflammatory changes. Spleen: Normal in size without focal abnormality. Adrenals/Urinary Tract: Adrenal glands are unremarkable. Kidneys are normal, without renal calculi, focal lesion, or hydronephrosis. Bladder is unremarkable. Stomach/Bowel: There is active gastrointestinal bleeding within the distal rectum approximately 3.5 cm proximal to the anise. There is some distal rectal wall thickening. No dilated bowel loops are seen. The appendix is not visualized. Lymphatic: No enlarged lymph nodes are seen. Reproductive: Prostate gland is within normal limits. Other: There is a small amount of free fluid throughout the abdomen and pelvis. There is mild body wall edema. There is no focal abdominal wall hernia. Musculoskeletal: Degenerative changes affect the spine. IMPRESSION: 1. Active gastrointestinal bleeding within the distal rectum. 2. 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 3. Rectal wall thickening worrisome for proctitis. 4. Small amount of ascites. 5. Body wall edema. Aortic Atherosclerosis (ICD10-I70.0). These results were called by telephone at the time of interpretation on 06/10/2023 at 11:46 pm to provider [facility]ID [facility] , who verbally acknowledged these results. Electronically Signed   By: Tyron Gallon M.D.   On:  06/10/2023 23:48    Microbiology: Results for orders  placed or performed during the hospital encounter of 03/18/23  Resp panel by RT-PCR (RSV, Flu A&B, Covid) Anterior Nasal Swab     Status: None   Collection Time: 03/18/23  7:14 PM   Specimen: Anterior Nasal Swab  Result Value Ref Range Status   SARS Coronavirus 2 by RT PCR NEGATIVE NEGATIVE Final    Comment: (NOTE) SARS-CoV-2 target nucleic acids are NOT DETECTED.  The SARS-CoV-2 RNA is generally detectable in upper respiratory specimens during the acute phase of infection. The lowest concentration of SARS-CoV-2 viral copies this assay can detect is 138 copies/mL. A negative result does not preclude SARS-Cov-2 infection and should not be used as the sole basis for treatment or other patient management decisions. A negative result may occur with  improper specimen collection/handling, submission of specimen other than nasopharyngeal swab, presence of viral mutation(s) within the areas targeted by this assay, and inadequate number of viral copies(<138 copies/mL). A negative result must be combined with clinical observations, patient history, and epidemiological information. The expected result is Negative.  Fact Sheet for Patients:  BloggerCourse.com  Fact Sheet for Healthcare Providers:  SeriousBroker.it  This test is no t yet approved or cleared by the [place]  FDA and  has been authorized for detection and/or diagnosis of SARS-CoV-2 by FDA under an Emergency Use Authorization (EUA). This EUA will remain  in effect (meaning this test can be used) for the duration of the COVID-19 declaration under Section 564(b)(1) of the Act, 21 U.S.C.section 360bbb-3(b)(1), unless the authorization is terminated  or revoked sooner.       Influenza A by PCR NEGATIVE NEGATIVE Final   Influenza B by PCR NEGATIVE NEGATIVE Final    Comment: (NOTE) The Xpert Xpress SARS-CoV-2/FLU/RSV plus assay is intended as an aid in the diagnosis of  influenza from Nasopharyngeal swab specimens and should not be used as a sole basis for treatment. Nasal washings and aspirates are unacceptable for Xpert Xpress SARS-CoV-2/FLU/RSV testing.  Fact Sheet for Patients: BloggerCourse.com  Fact Sheet for Healthcare Providers: SeriousBroker.it  This test is not yet approved or cleared by the [place]  FDA and has been authorized for detection and/or diagnosis of SARS-CoV-2 by FDA under an Emergency Use Authorization (EUA). This EUA will remain in effect (meaning this test can be used) for the duration of the COVID-19 declaration under Section 564(b)(1) of the Act, 21 U.S.C. section 360bbb-3(b)(1), unless the authorization is terminated or revoked.     Resp Syncytial Virus by PCR NEGATIVE NEGATIVE Final    Comment: (NOTE) Fact Sheet for Patients: BloggerCourse.com  Fact Sheet for Healthcare Providers: SeriousBroker.it  This test is not yet approved or cleared by the [place]  FDA and has been authorized for detection and/or diagnosis of SARS-CoV-2 by FDA under an Emergency Use Authorization (EUA). This EUA will remain in effect (meaning this test can be used) for the duration of the COVID-19 declaration under Section 564(b)(1) of the Act, 21 U.S.C. section 360bbb-3(b)(1), unless the authorization is terminated or revoked.  Performed at [facility], [address] [place], [place] [address]     Labs: CBC: Recent Labs  Lab 06/20/23 0405 06/21/23 0416  WBC 11.9* 8.4  HGB 10.8* 10.9*  HCT 32.8* 32.7*  MCV 88.9 89.3  PLT PLATELET CLUMPS NOTED ON SMEAR, UNABLE TO ESTIMATE 160   Basic Metabolic Panel: Recent Labs  Lab 06/20/23 0405 06/21/23 0416  NA 131* 131*  K 4.8 4.5  CL 98 97*  CO2 24 25  GLUCOSE 228* 136*  BUN 11 8  CREATININE 0.56* 0.56*  CALCIUM 8.0* 8.4*  MG 1.8 1.6*   Liver Function  Tests: No results for input(s): "AST", "ALT", "ALKPHOS", "BILITOT", "PROT", "ALBUMIN" in the last 168 hours.  CBG: Recent Labs  Lab 06/20/23 0814 06/20/23 1221 06/20/23 1644 06/20/23 2104 06/21/23 0839  GLUCAP 195* 257* 209* 148* 146*    Discharge time spent: less than 30 minutes.  Signed: Montey Apa, DO [facility] 06/26/2023

## 2023-06-21 NOTE — Progress Notes (Signed)
 Patient is packed up and coming to the door ready to leave. He is getting irritable.  I started working on discharge papers as soon as the MD completed them. I was waiting for TOC to bring the cab voucher for the patient but he insisted on walking to the entrance. He is refusing to wait in the room for the cab voucher. TOC met me and the patient in the  medical mall. Cab called by TOC. Patient refused to wait in the room for the cab and insisted on waiting outside at the entrance.

## 2023-06-22 ENCOUNTER — Encounter: Payer: Self-pay | Admitting: Surgery

## 2023-06-23 NOTE — Congregational Nurse Program (Signed)
  Dept: [phone]   Congregational Nurse Program Note  Date of Encounter: 06/23/2023 Client to [facility] day center, nurse led clinic for his blood sugar mediations. He is unhoused and is concerned his medications will get stolen or lost. He is currently taking metformin  850 mg BID and glipizide  mg daily. Client continues to delay his blood glucose monitoring. [facility] and will be seen again in 2 weeks. RN to assist with making the appointment. Medicaid application completed last week, awaiting approval. Juvenal Opoka BSN, RN Past Medical History: Past Medical History:  Diagnosis Date   Alcohol abuse    Diabetes mellitus without complication [facility])     Encounter Details:  Community Questionnaire - 06/23/23 1328       Questionnaire   Ask client: Do you give verbal consent for me to treat you today? Yes    Student Assistance N/A    Location Patient Event organiser    Encounter Setting CN site    Population Status Unhoused    Insurance Uninsured ([employer]s/Self-Pay/Medicaid Family Planning)   BS/BC through the exchange   Insurance/Financial Assistance Referral N/A   RN to assist with Medicaid application.   Medication Patient Medications Reviewed    Medical Provider [facility]   [facility]   Screening Referrals Made N/A    Medical Referrals Made N/A   [facility] call [facility] for [facility]   Medical Appointment Completed N/A    CNP Interventions Educate;Advocate/Support    Screenings CN Performed N/A    ED Visit Averted N/A    Life-Saving Intervention Made N/A

## 2023-06-29 NOTE — Anesthesia Postprocedure Evaluation (Signed)
 Anesthesia Post Note  Patient: Richard Estes  Procedure(s) Performed: EXAM UNDER ANESTHESIA, RECTUM  Patient location during evaluation: PACU Anesthesia Type: General Level of consciousness: awake and alert Pain management: pain level controlled Vital Signs Assessment: post-procedure vital signs reviewed and stable Respiratory status: spontaneous breathing, nonlabored ventilation, respiratory function stable and patient connected to nasal cannula oxygen Cardiovascular status: blood pressure returned to baseline and stable Postop Assessment: no apparent nausea or vomiting Anesthetic complications: no  No notable events documented.   Last Vitals:  Vitals:   06/21/23 0555 06/21/23 0853  BP: 130/85 (!) 130/99  Pulse: 75 82  Resp: 18 18  Temp: 37 C 36.5 C  SpO2:  100%    Last Pain:  Vitals:   06/21/23 0853  TempSrc: Oral  PainSc:                  Enrique Harvest

## 2023-07-01 DIAGNOSIS — E119 Type 2 diabetes mellitus without complications: Secondary | ICD-10-CM

## 2023-07-01 LAB — GLUCOSE, POCT (MANUAL RESULT ENTRY): POC Glucose: 213 mg/dL — AB (ref 70–99)

## 2023-07-01 NOTE — Congregational Nurse Program (Signed)
  Dept: [phone]   Congregational Nurse Program Note  Date of Encounter: 07/01/2023 Client to [facility] day center, nurse led clinic with request for blood glucose reading. Non fasting blood glucose 213. He reports he is taking his metformin  500 mg twice a day and the glipizide  10 mg daily. He felt his blood sugar was elevated because he ate cake on Sunday. He hsa a [facility]. Medicaid is still pending at this time. Juvenal Opoka BSN, RN Past Medical History: Past Medical History:  Diagnosis Date   Alcohol abuse    Diabetes mellitus without complication [facility])     Encounter Details:  Community Questionnaire - 07/01/23 1000       Questionnaire   Ask client: Do you give verbal consent for me to treat you today? Yes    Student Assistance N/A    Location Patient Served  [facility]    Encounter Setting CN site    Population Status Unknown   curently staying with his son   Insurance Uninsured ([employer]s/Self-Pay/Medicaid Family Planning)   BS/BC through the exchange   Insurance/Financial Assistance Referral N/A   RN to assist with Medicaid application.   Medication N/A    Medical Provider [facility]   [facility]   Screening Referrals Made N/A    Medical Referrals Made N/A   [facility] call [facility] for [facility]   Medical Appointment Completed N/A    CNP Interventions Educate;Advocate/Support    Screenings CN Performed Blood Glucose    ED Visit Averted N/A    Life-Saving Intervention Made N/A

## 2023-07-02 ENCOUNTER — Ambulatory Visit: Payer: Self-pay | Admitting: Gerontology

## 2023-07-03 ENCOUNTER — Encounter: Payer: MEDICAID | Admitting: Surgery

## 2023-08-11 DIAGNOSIS — E119 Type 2 diabetes mellitus without complications: Secondary | ICD-10-CM

## 2023-08-11 LAB — GLUCOSE, POCT (MANUAL RESULT ENTRY): POC Glucose: 422 mg/dL — AB (ref 70–99)

## 2023-08-11 NOTE — Congregational Nurse Program (Signed)
  Dept: [phone]   Congregational Nurse Program Note  Date of Encounter: 08/11/2023 Client to [facility] day center for blood glucose check. He had not been to the center in a week, unclear as to whether he had been taking his metformin  and glipizide . Non-fasting blood sugar 422. Client complaining of pain to his left shin from an old wound unable to approximate how old 25 years maybe. There was a dark area to the shin, client reports he had not hit it or injured in recently. Area had no edema, some tenderness noted. Client stated he had some difficulty walking. RN has provided education over the last few months regarding the serious need of better blood sugar control, client does not receive this well. He was at the center long enough today for RN to make a new patient appointment for him at [facility] medical tomorrow 6/25 at 2:45 pm. RN will assist with [place] bus passes. RN again provided education regarding hydration and abstaining from alcohol. MARLA Marina BSN, RN Past Medical History: Past Medical History:  Diagnosis Date   Alcohol abuse    Diabetes mellitus without complication [facility])     Encounter Details:  Community Questionnaire - 08/11/23 0900       Questionnaire   Ask client: Do you give verbal consent for me to treat you today? Yes    Student Assistance N/A    Location Patient Served  [facility]    Encounter Setting CN site    Population Status Unknown   curently staying with his son   Insurance Medicaid   BS/BC through the exchange   Insurance/Financial Assistance Referral N/A   RN to assist with Medicaid application.   Medication Have Medication Insecurities    Medical Provider No   [facility]   Screening Referrals Made N/A    Medical Referrals Made [facility] PCP[facility]inic   [facility]. call placed for apt   Medical Appointment Completed N/A    CNP Interventions Educate;Advocate/Support;Navigate Healthcare System    Screenings CN Performed  Blood Glucose    ED Visit Averted N/A    Life-Saving Intervention Made N/A

## 2023-08-12 ENCOUNTER — Encounter: Payer: Self-pay | Admitting: Emergency Medicine

## 2023-08-12 ENCOUNTER — Ambulatory Visit (INDEPENDENT_AMBULATORY_CARE_PROVIDER_SITE_OTHER): Payer: MEDICAID | Admitting: Family Medicine

## 2023-08-12 ENCOUNTER — Encounter: Payer: Self-pay | Admitting: Family Medicine

## 2023-08-12 ENCOUNTER — Other Ambulatory Visit: Payer: Self-pay

## 2023-08-12 VITALS — BP 130/72 | HR 88 | Resp 16 | Ht 69.0 in | Wt 135.0 lb

## 2023-08-12 DIAGNOSIS — Z794 Long term (current) use of insulin: Secondary | ICD-10-CM

## 2023-08-12 DIAGNOSIS — R7989 Other specified abnormal findings of blood chemistry: Secondary | ICD-10-CM

## 2023-08-12 DIAGNOSIS — Z0000 Encounter for general adult medical examination without abnormal findings: Secondary | ICD-10-CM

## 2023-08-12 DIAGNOSIS — K922 Gastrointestinal hemorrhage, unspecified: Secondary | ICD-10-CM | POA: Diagnosis not present

## 2023-08-12 DIAGNOSIS — Z1211 Encounter for screening for malignant neoplasm of colon: Secondary | ICD-10-CM

## 2023-08-12 DIAGNOSIS — E1165 Type 2 diabetes mellitus with hyperglycemia: Secondary | ICD-10-CM | POA: Diagnosis not present

## 2023-08-12 DIAGNOSIS — F17200 Nicotine dependence, unspecified, uncomplicated: Secondary | ICD-10-CM

## 2023-08-12 DIAGNOSIS — Z1159 Encounter for screening for other viral diseases: Secondary | ICD-10-CM

## 2023-08-12 DIAGNOSIS — F1010 Alcohol abuse, uncomplicated: Secondary | ICD-10-CM

## 2023-08-12 DIAGNOSIS — D649 Anemia, unspecified: Secondary | ICD-10-CM

## 2023-08-12 DIAGNOSIS — Z09 Encounter for follow-up examination after completed treatment for conditions other than malignant neoplasm: Secondary | ICD-10-CM

## 2023-08-12 DIAGNOSIS — F17210 Nicotine dependence, cigarettes, uncomplicated: Secondary | ICD-10-CM

## 2023-08-12 DIAGNOSIS — Z5900 Homelessness unspecified: Secondary | ICD-10-CM

## 2023-08-12 MED ORDER — INSULIN PEN NEEDLE 32G X 4 MM MISC
1 refills | Status: DC
  Filled 2023-08-12: qty 100, 90d supply, fill #0

## 2023-08-12 MED ORDER — LANTUS SOLOSTAR 100 UNIT/ML ~~LOC~~ SOPN
10.0000 [IU] | PEN_INJECTOR | Freq: Every day | SUBCUTANEOUS | 99 refills | Status: DC
  Filled 2023-08-12: qty 15, 75d supply, fill #0

## 2023-08-12 NOTE — Progress Notes (Unsigned)
 Name: Richard Estes   MRN: [id number]    DOB: 04-Oct-1960   Date:08/12/2023       Progress Note  Chief Complaint  Patient presents with   Establish Care     Subjective:   Richard Estes is a 63 y.o. male, presents to clinic to est care  Uncontrolled DM, recent hospitalizations and lower GI bleed are his most urgent issues/problems  On metformin  and glipizide  Sugars in am >400 per pt report Last eGFR >60 Lab Results  Component Value Date   HGBA1C 14.3 (H) 06/11/2023   He's lost 20 lbs in the past couple months, having polyuria, polydipsia, nocturia, weight loss  No DOE, palpitations, near syncope Hemoglobin  Date Value Ref Range Status  06/21/2023 10.9 (L) 13.0 - 17.0 g/dL Final  94/96/7974 89.1 (L) 13.0 - 17.0 g/dL Final  94/97/7974 89.3 (L) 13.0 - 17.0 g/dL Final  94/97/7974 88.6 (L) 13.0 - 17.0 g/dL Final  88/91/7981 86.6 13.0 - 17.7 g/dL Final   HGB  Date Value Ref Range Status  06/11/2014 12.9 (L) 13.0 - 18.0 g/dL Final   Hx of alcohol use/abuse? LFTs elevated: Lab Results  Component Value Date   ALT 102 (H) 06/19/2023   AST 114 (H) 06/19/2023   ALKPHOS 76 06/19/2023   BILITOT 0.6 06/19/2023    HFU - admittted 4/23 and 5/2  Labs, discharge summaries all reviewed today Pt has not f/up with general surgery as instructed, nor had repeat labs During last hospitalization he had low mag, potassium, drop in H/H, transfusion, hyperglycemia and he was started on diabetes meds    Current Outpatient Medications:    Blood Glucose Monitoring Suppl (BLOOD GLUCOSE MONITOR SYSTEM) w/Device KIT, Use 3 (three) times daily to check blood sugar, Disp: 1 kit, Rfl: 0   Blood Glucose Monitoring Suppl (TRUE METRIX METER) w/Device KIT, use to check blood glucose, Disp: 1 kit, Rfl: 0   EPINEPHrine  0.3 mg/0.3 mL IJ SOAJ injection, Inject 0.3 mg into the muscle as needed for anaphylaxis., Disp: 2 each, Rfl: 5   glipiZIDE  (GLUCOTROL  XL) 10 MG 24 hr tablet, Take 1 tablet (10 mg  total) by mouth daily with breakfast., Disp: 30 tablet, Rfl: 0   Glucose Blood (BLOOD GLUCOSE TEST STRIPS) STRP, Use as directed to check blood sugar 4 (four) times daily., Disp: 100 each, Rfl: 0   Glucose Blood (BLOOD GLUCOSE TEST STRIPS) STRP, Use 3 (three) times daily. Use as directed to check blood sugar., Disp: 100 strip, Rfl: 0   Lancet Device MISC, Use 3 (three) times daily to check blood sugar, Disp: 1 each, Rfl: 0   Lancets MISC, Use 3 (three) times daily. Use as directed to check blood sugar., Disp: 100 each, Rfl: 0   metFORMIN  (GLUCOPHAGE ) 850 MG tablet, Take 1 tablet (850 mg total) by mouth 2 (two) times daily with a meal., Disp: 60 tablet, Rfl: 0   TRUEplus Lancets 33G MISC, Use as directed to check blood sugar up 4 (four) times daily., Disp: 100 each, Rfl: 0   albuterol  (VENTOLIN  HFA) 108 (90 Base) MCG/ACT inhaler, Inhale 2 puffs into the lungs every 6 (six) hours as needed for shortness of breath or wheezing. (Patient not taking: Reported on 08/12/2023), Disp: , Rfl:   Patient Active Problem List   Diagnosis Date Noted   Acute GI bleeding 06/19/2023   Anal ulcer 06/19/2023   Lower GI bleeding 06/19/2023   Rectal bleeding 06/12/2023   Uncontrolled type 2 diabetes mellitus with hyperglycemia,  without long-term current use of insulin  (HCC) 06/11/2023   Unhoused person 06/11/2023   Hemorrhoids, internal, with bleeding 06/11/2023   ABLA (acute blood loss anemia) 06/11/2023   Hypokalemia 06/11/2023   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   Encounter to establish care 04/03/2022   Current smoker 04/03/2022   Alcohol abuse 04/03/2022    Past Surgical History:  Procedure Laterality Date   RECTAL EXAM UNDER ANESTHESIA N/A 06/11/2023   Procedure: EXAM UNDER ANESTHESIA, RECTUM;  Surgeon: Rodolph Romano, MD;  Location: [facility] ORS;  Service: General;  Laterality: N/A;   RECTAL EXAM UNDER ANESTHESIA N/A 06/19/2023   Procedure: EXAM UNDER ANESTHESIA, RECTUM;  Surgeon:  Desiderio Schanz, MD;  Location: [facility] ORS;  Service: General;  Laterality: N/A;    Family History  Problem Relation Age of Onset   Arthritis Mother     Social History   Tobacco Use   Smoking status: Every Day    Current packs/day: 1.00    Types: Cigarettes   Smokeless tobacco: Never   Tobacco comments:    Sometimes list  Vaping Use   Vaping status: Never Used  Substance Use Topics   Alcohol use: Yes    Alcohol/week: 22.0 standard drinks of alcohol    Types: 12 Cans of beer, 10 Shots of liquor per week   Drug use: No    Comment: last used marijuana 08/09/16     Allergies  Allergen Reactions   Other Anaphylaxis   Bee Venom     Health Maintenance  Topic Date Due   FOOT EXAM  Never done   OPHTHALMOLOGY EXAM  Never done   Diabetic kidney evaluation - Urine ACR  Never done   Hepatitis C Screening  Never done   Colonoscopy  Never done   COVID-19 Vaccine (2 - 2024-25 season) 08/28/2023 (Originally 10/19/2022)   Zoster Vaccines- Shingrix (1 of 2) 11/12/2023 (Originally 08/30/2010)   Pneumococcal Vaccine 47-63 Years old (1 of 2 - PCV) 08/11/2024 (Originally 08/30/1979)   INFLUENZA VACCINE  09/18/2023   HEMOGLOBIN A1C  12/11/2023   Diabetic kidney evaluation - eGFR measurement  06/20/2024   DTaP/Tdap/Td (2 - Td or Tdap) 10/27/2032   HIV Screening  Completed   Hepatitis B Vaccines  Aged Out   HPV VACCINES  Aged Out   Meningococcal B Vaccine  Aged Out    Chart Review Today: I personally reviewed active problem list, medication list, allergies, family history, social history, health maintenance, notes from last encounter, lab results, imaging with the patient/caregiver today.   Review of Systems  Constitutional: Negative.   HENT: Negative.    Eyes: Negative.   Respiratory: Negative.    Cardiovascular: Negative.   Gastrointestinal: Negative.   Endocrine: Negative.   Genitourinary: Negative.   Musculoskeletal: Negative.   Skin: Negative.   Allergic/Immunologic: Negative.    Neurological: Negative.   Hematological: Negative.   Psychiatric/Behavioral: Negative.    All other systems reviewed and are negative.    Objective:   Vitals:   08/12/23 1338  BP: 130/72  Pulse: 88  Resp: 16  SpO2: 97%  Weight: 135 lb (61.2 kg)  Height: 5' 9 (1.753 m)    Body mass index is 19.94 kg/m.  Physical Exam Vitals and nursing note reviewed.  Constitutional:      General: He is not in acute distress.    Appearance: Normal appearance. He is well-developed. He is not ill-appearing, toxic-appearing or diaphoretic.     Comments: Appeared thin  HENT:  Head: Normocephalic and atraumatic.     Right Ear: External ear normal.     Left Ear: External ear normal.     Nose: Nose normal.   Eyes:     General: No scleral icterus.       Right eye: No discharge.        Left eye: No discharge.     Conjunctiva/sclera: Conjunctivae normal.   Neck:     Trachea: No tracheal deviation.   Cardiovascular:     Rate and Rhythm: Normal rate.  Pulmonary:     Effort: Pulmonary effort is normal. No respiratory distress.     Breath sounds: No stridor.   Skin:    General: Skin is warm and dry.     Findings: No rash.   Neurological:     Mental Status: He is alert.     Motor: No abnormal muscle tone.     Coordination: Coordination normal.     Gait: Gait normal.   Psychiatric:        Mood and Affect: Mood normal.        Behavior: Behavior normal. Behavior is cooperative.      Functional Status Survey: Is the patient deaf or have difficulty hearing?: No Does the patient have difficulty seeing, even when wearing glasses/contacts?: No Does the patient have difficulty concentrating, remembering, or making decisions?: No Does the patient have difficulty walking or climbing stairs?: No Does the patient have difficulty dressing or bathing?: No Does the patient have difficulty doing errands alone such as visiting a doctor's office or shopping?: No Results for orders placed or  performed in visit on 08/11/23  POCT glucose   Collection Time: 08/11/23 12:49 PM  Result Value Ref Range   POC Glucose 422 (A) 70 - 99 mg/dl      Assessment & Plan:     ICD-10-CM   1. Encounter for medical examination to establish care  Z00.00    reviewed and updated chart as able    2. Uncontrolled type 2 diabetes mellitus with hyperglycemia, with long-term current use of insulin  (HCC)  E11.65 Comprehensive metabolic panel with GFR   Z79.4 Hemoglobin A1c    Lipid panel    Microalbumin / creatinine urine ratio    Insulin  Pen Needle 32G X 4 MM MISC    insulin  glargine (LANTUS  SOLOSTAR) 100 UNIT/ML Solostar Pen    DISCONTINUED: insulin  glargine (LANTUS  SOLOSTAR) 100 UNIT/ML Solostar Pen    a1c 14.7, will need to start insulin , hopefully nurse assistance is available at church he goes to, offered to show pt insulin  pen and he left office suddenly to check on his uber and then he did not come back I had offered to show him the insulin  pen and provide sample - he said the nurse he sees daily would go to the pharmacy to get his meds tomorrow am and would keep it in the fridge for him since he sleeps in his car.  That is also where he checks his sugars daily. Which he stated were >400s more mornings. So currently with new metformin  and glipizide  I do not know if glipizide  is doing much, he does not check sugars on his own or have glucometer with him  Would plan to start basal insulin , d/c glipizide  to avoid hypoglycemia, and try to coordinate with nurse on his care.  Also referral to nurse educator, case manager and SW would help in getting pts resources, housing, education etc.    3. Screening for viral disease  Z11.59     4. Colon cancer screening  Z12.11 Ambulatory referral to Gastroenterology    5. Acute GI bleeding  K92.2 CBC with Differential/Platelet    Iron, TIBC and Ferritin Panel   denies any repeated bleeding since last two ED/hospital visits, recheck labs today    6. Alcohol  abuse  F10.10 Comprehensive metabolic panel with GFR    AMB Referral [facility] Care Management   drinks everything he can get his hands on wine, beer, liquor, but trying to cut back    7. Current smoker  F17.200 AMB Referral [facility] Care Management    8. Anemia, unspecified type  D64.9 CBC with Differential/Platelet    Iron, TIBC and Ferritin Panel   recheck cbc and iron panel    9. Encounter for examination following treatment at hospital  Z09 CBC with Differential/Platelet    Comprehensive metabolic panel with GFR    Hemoglobin A1c    Lipid panel    Microalbumin / creatinine urine ratio    Iron, TIBC and Ferritin Panel   HFU done - with reviewing chart, labs, f/up labs obtained today and arranging needed f/up with specialists    10. Encounter for hepatitis C screening test for low risk patient  Z11.59 Hepatitis C Antibody    11. Unhoused person  Z59.00 AMB Referral [facility] Care Management   sleeping/living in car, limited phone minutes - will be difficult to contact, states we can contact his brother until he buys more time/phone    12. Smoking greater than 20 pack years  F17.210 Ambulatory Referral Lung Cancer Screening [facility]   discussed lung cancer screening CT - he agreed to consult for the screening     13. Elevated LFTs  R79.89 Comprehensive metabolic panel with GFR   likely due to ETOH abuse          Lantus  started - will stop glipizide  and titrate insulin  to fasting CBGs 10 units once daily or 0.1 to 0.2 units/kg once daily    Pt currently has a phone number that doesn't work until he gets paid/benefits to pay for more minutes or until he gets a new phone. He said we can call his Brother greg and leave any msgs with him, I asked if he did DPR to share private health info and he verbally said yet, but I do not know if he did forms with front office.     Return for 2 w f/up labs/insulin .   Michelene Cower, PA-C 08/12/23 1:49 PM

## 2023-08-12 NOTE — Congregational Nurse Program (Signed)
  Dept: [phone]   Congregational Nurse Program Note  Date of Encounter: 08/12/2023 Client to [facility] day center for cooling shelter and breakfast. Client is aware of his upcoming new patient apt this afternoon at [facility]. Due to the heat, RN set up a round trip ride through [place]. Client is aware he will arrive to his appointment early, but he will be out of the heat and not be dependent on the [place] bus. RN advised client that if he has prescriptions ordered to use the [facility] pharmacy. He is currently out of glipizide  and has only a few metformin  left. Client voiced understanding and appreciation for assistance. MARLA Marina BSN, RN Past Medical History: Past Medical History:  Diagnosis Date   Alcohol abuse    Diabetes mellitus without complication [facility])     Encounter Details:  Community Questionnaire - 08/12/23 1100       Questionnaire   Ask client: Do you give verbal consent for me to treat you today? Yes    Student Assistance N/A    Location Patient Served  [facility]    Encounter Setting CN site    Population Status Unknown   curently staying with his son   Insurance Medicaid   BS/BC through the exchange   Insurance/Financial Assistance Referral N/A   RN to assist with Medicaid application.   Medication Have Medication Insecurities    Medical Provider No   [facility]   Screening Referrals Made N/A    Medical Referrals Made [facility]   [facility] call placed for [facility]   Medical Appointment Completed N/A    CNP Interventions Educate;Advocate/Support    Screenings CN Performed Blood Glucose    ED Visit Averted N/A    Life-Saving Intervention Made N/A

## 2023-08-12 NOTE — Patient Instructions (Signed)
 We will start insulin  and then adjust it slowly based on your morning sugars I will try and coordinate with the nurse helping you.  We need to see you back in 2 weeks

## 2023-08-13 ENCOUNTER — Other Ambulatory Visit: Payer: Self-pay

## 2023-08-13 ENCOUNTER — Telehealth: Payer: Self-pay

## 2023-08-13 DIAGNOSIS — R7989 Other specified abnormal findings of blood chemistry: Secondary | ICD-10-CM | POA: Insufficient documentation

## 2023-08-13 DIAGNOSIS — F17210 Nicotine dependence, cigarettes, uncomplicated: Secondary | ICD-10-CM | POA: Insufficient documentation

## 2023-08-13 LAB — HEPATITIS C ANTIBODY: Hepatitis C Ab: REACTIVE — AB

## 2023-08-13 MED ORDER — LANTUS SOLOSTAR 100 UNIT/ML ~~LOC~~ SOPN
10.0000 [IU] | PEN_INJECTOR | Freq: Every day | SUBCUTANEOUS | 99 refills | Status: DC
Start: 2023-08-13 — End: 2023-08-14
  Filled 2023-08-13 (×2): qty 9, 90d supply, fill #0

## 2023-08-13 NOTE — Congregational Nurse Program (Signed)
  Dept: [phone]   Congregational Nurse Program Note  Date of Encounter: 08/13/2023 Client to [facility] day center, nurse led clinic as follow up to his new patient visit at [facility] yesterday. Client was prescribed lantus  insulin  10-20 units daily. RN contacted and left a message for his prescriber Michelene Cower to express concerns about his ability to administer and store this medication appropriately. Client was aware of this call.  RN has provided education related to the risks of elevated blood sugar, effects on his kidneys, eyes,and heart, as well as diet education and will continue to do as client allows.MARLA Marina BSN, RN Past Medical History: Past Medical History:  Diagnosis Date   Alcohol abuse    Diabetes mellitus without complication [facility])     Encounter Details:  Community Questionnaire - 08/13/23 1100       Questionnaire   Ask client: Do you give verbal consent for me to treat you today? Yes    Student Assistance N/A    Location Patient Served  [facility]    Encounter Setting CN site    Population Status Unknown   curently staying with his son   Insurance Medicaid   BS/BC through the exchange   Insurance/Financial Assistance Referral N/A   RN to assist with Medicaid application.   Medication Have Medication Insecurities    Medical Provider Yes   [facility]   Screening Referrals Made N/A    Medical Referrals Made [facility]   [facility] call [facility] for [facility]   Medical Appointment Completed [facility]    CNP Interventions Educate;Advocate/Support    Screenings CN Performed N/A    ED Visit Averted N/A    Life-Saving Intervention Made N/A

## 2023-08-13 NOTE — Telephone Encounter (Signed)
 Copied from CRM [id number]. Topic: General - Call Back - No Documentation >> Aug 13, 2023 10:42 AM Tonda B wrote: Reason for CRM: [facility] calling wanting to speak to about patient care plan please call karen @ [phone]

## 2023-08-14 ENCOUNTER — Ambulatory Visit: Payer: Self-pay | Admitting: Family Medicine

## 2023-08-14 DIAGNOSIS — K922 Gastrointestinal hemorrhage, unspecified: Secondary | ICD-10-CM

## 2023-08-14 DIAGNOSIS — F1010 Alcohol abuse, uncomplicated: Secondary | ICD-10-CM

## 2023-08-14 DIAGNOSIS — E119 Type 2 diabetes mellitus without complications: Secondary | ICD-10-CM

## 2023-08-14 DIAGNOSIS — B1920 Unspecified viral hepatitis C without hepatic coma: Secondary | ICD-10-CM

## 2023-08-14 DIAGNOSIS — R7989 Other specified abnormal findings of blood chemistry: Secondary | ICD-10-CM

## 2023-08-14 MED ORDER — GLIPIZIDE 10 MG PO TABS
10.0000 mg | ORAL_TABLET | Freq: Every day | ORAL | 3 refills | Status: DC
  Filled 2023-08-14: qty 30, 30d supply, fill #0

## 2023-08-14 MED ORDER — METFORMIN HCL 1000 MG PO TABS
1000.0000 mg | ORAL_TABLET | Freq: Two times a day (BID) | ORAL | 5 refills | Status: DC
Start: 2023-08-14 — End: 2023-10-11
  Filled 2023-08-14: qty 60, 30d supply, fill #0

## 2023-08-15 ENCOUNTER — Other Ambulatory Visit: Payer: Self-pay

## 2023-08-15 LAB — HEMOGLOBIN A1C
Hgb A1c MFr Bld: 8.1 % — ABNORMAL HIGH (ref <5.7)
Mean Plasma Glucose: 186 mg/dL
eAG (mmol/L): 10.3 mmol/L

## 2023-08-15 LAB — LIPID PANEL
Cholesterol: 112 mg/dL (ref <200)
HDL: 29 mg/dL — ABNORMAL LOW (ref >=40)
LDL Cholesterol (Calc): 64 mg/dL
Non-HDL Cholesterol (Calc): 83 mg/dL (ref <130)
Total CHOL/HDL Ratio: 3.9 (calc) (ref <5.0)
Triglycerides: 109 mg/dL (ref <150)

## 2023-08-15 LAB — CBC WITH DIFFERENTIAL/PLATELET
Absolute Lymphocytes: 1573 {cells}/uL (ref 850–3900)
Absolute Monocytes: 594 {cells}/uL (ref 200–950)
Basophils Absolute: 39 {cells}/uL (ref 0–200)
Basophils Relative: 0.7 %
Eosinophils Absolute: 72 {cells}/uL (ref 15–500)
Eosinophils Relative: 1.3 %
HCT: 36.3 % — ABNORMAL LOW (ref 38.5–50.0)
Hemoglobin: 11.7 g/dL — ABNORMAL LOW (ref 13.2–17.1)
MCH: 29.4 pg (ref 27.0–33.0)
MCHC: 32.2 g/dL (ref 32.0–36.0)
MCV: 91.2 fL (ref 80.0–100.0)
MPV: 9.7 fL (ref 7.5–12.5)
Monocytes Relative: 10.8 %
Neutro Abs: 3223 {cells}/uL (ref 1500–7800)
Neutrophils Relative %: 58.6 %
Platelets: 53 10*3/uL — ABNORMAL LOW (ref 140–400)
RBC: 3.98 10*6/uL — ABNORMAL LOW (ref 4.20–5.80)
RDW: 11.7 % (ref 11.0–15.0)
Total Lymphocyte: 28.6 %
WBC: 5.5 10*3/uL (ref 3.8–10.8)

## 2023-08-15 LAB — COMPREHENSIVE METABOLIC PANEL WITH GFR
AG Ratio: 0.9 (calc) — ABNORMAL LOW (ref 1.0–2.5)
ALT: 61 U/L — ABNORMAL HIGH (ref 9–46)
AST: 77 U/L — ABNORMAL HIGH (ref 10–35)
Albumin: 3.7 g/dL (ref 3.6–5.1)
Alkaline phosphatase (APISO): 85 U/L (ref 35–144)
BUN/Creatinine Ratio: 12 (calc) (ref 6–22)
BUN: 8 mg/dL (ref 7–25)
CO2: 29 mmol/L (ref 20–32)
Calcium: 9.3 mg/dL (ref 8.6–10.3)
Chloride: 96 mmol/L — ABNORMAL LOW (ref 98–110)
Creat: 0.66 mg/dL — ABNORMAL LOW (ref 0.70–1.35)
Globulin: 4 g/dL — ABNORMAL HIGH (ref 1.9–3.7)
Glucose, Bld: 167 mg/dL — ABNORMAL HIGH (ref 65–99)
Potassium: 3.8 mmol/L (ref 3.5–5.3)
Sodium: 133 mmol/L — ABNORMAL LOW (ref 135–146)
Total Bilirubin: 0.6 mg/dL (ref 0.2–1.2)
Total Protein: 7.7 g/dL (ref 6.1–8.1)
eGFR: 106 mL/min/{1.73_m2} (ref >=60)

## 2023-08-15 LAB — IRON,TIBC AND FERRITIN PANEL
%SAT: 22 % (ref 20–48)
Ferritin: 267 ng/mL (ref 24–380)
Iron: 100 ug/dL (ref 50–180)
TIBC: 449 ug/dL — ABNORMAL HIGH (ref 250–425)

## 2023-08-15 LAB — HEPATITIS C RNA QUANTITATIVE
HCV Quantitative Log: 6.92 {Log_IU}/mL — ABNORMAL HIGH
HCV RNA, PCR, QN: 8230000 [IU]/mL — ABNORMAL HIGH

## 2023-08-16 ENCOUNTER — Other Ambulatory Visit: Payer: Self-pay

## 2023-08-17 ENCOUNTER — Other Ambulatory Visit: Payer: Self-pay

## 2023-08-18 ENCOUNTER — Encounter: Payer: Self-pay | Admitting: Family Medicine

## 2023-08-18 ENCOUNTER — Telehealth: Payer: Self-pay

## 2023-08-18 DIAGNOSIS — B1920 Unspecified viral hepatitis C without hepatic coma: Secondary | ICD-10-CM | POA: Insufficient documentation

## 2023-08-18 NOTE — Progress Notes (Signed)
 Complex Care Management Note Care Guide Note  08/18/2023 Name: Richard Estes MRN: [id number] DOB: Jul 08, 1960   Complex Care Management Outreach Attempts: An unsuccessful telephone outreach was attempted today to offer the patient information about available complex care management services.  Follow Up Plan:  Additional outreach attempts will be made to offer the patient complex care management information and services.   Encounter Outcome:  No Answer  Dream[facility]  [facility], [facility] Health Care Management Assistant Direct Dial: [phone]  Fax: [phone]

## 2023-08-19 ENCOUNTER — Other Ambulatory Visit: Payer: Self-pay

## 2023-08-19 ENCOUNTER — Emergency Department
Admission: EM | Admit: 2023-08-19 | Discharge: 2023-08-19 | Disposition: A | Discharge status: Home / Self Care | Payer: MEDICAID | Attending: Emergency Medicine | Admitting: Emergency Medicine

## 2023-08-19 DIAGNOSIS — E871 Hypo-osmolality and hyponatremia: Secondary | ICD-10-CM | POA: Insufficient documentation

## 2023-08-19 DIAGNOSIS — E119 Type 2 diabetes mellitus without complications: Secondary | ICD-10-CM

## 2023-08-19 DIAGNOSIS — R739 Hyperglycemia, unspecified: Secondary | ICD-10-CM | POA: Insufficient documentation

## 2023-08-19 LAB — COMPREHENSIVE METABOLIC PANEL WITH GFR
ALT: 73 U/L — ABNORMAL HIGH (ref 0–44)
AST: 86 U/L — ABNORMAL HIGH (ref 15–41)
Albumin: 3.1 g/dL — ABNORMAL LOW (ref 3.5–5.0)
Alkaline Phosphatase: 83 U/L (ref 38–126)
Anion gap: 9 (ref 5–15)
BUN: 10 mg/dL (ref 8–23)
CO2: 23 mmol/L (ref 22–32)
Calcium: 8.5 mg/dL — ABNORMAL LOW (ref 8.9–10.3)
Chloride: 97 mmol/L — ABNORMAL LOW (ref 98–111)
Creatinine, Ser: 0.7 mg/dL (ref 0.61–1.24)
GFR, Estimated: >60 mL/min (ref >60)
Glucose, Bld: 493 mg/dL — ABNORMAL HIGH (ref 70–99)
Potassium: 3.7 mmol/L (ref 3.5–5.1)
Sodium: 129 mmol/L — ABNORMAL LOW (ref 135–145)
Total Bilirubin: 0.4 mg/dL (ref 0.0–1.2)
Total Protein: 7.2 g/dL (ref 6.5–8.1)

## 2023-08-19 LAB — CBC WITH DIFFERENTIAL/PLATELET
Abs Immature Granulocytes: 0.02 10*3/uL (ref 0.00–0.07)
Basophils Absolute: 0 10*3/uL (ref 0.0–0.1)
Basophils Relative: 0 %
Eosinophils Absolute: 0.1 10*3/uL (ref 0.0–0.5)
Eosinophils Relative: 2 %
HCT: 32.2 % — ABNORMAL LOW (ref 39.0–52.0)
Hemoglobin: 10.6 g/dL — ABNORMAL LOW (ref 13.0–17.0)
Immature Granulocytes: 0 %
Lymphocytes Relative: 40 %
Lymphs Abs: 2.7 10*3/uL (ref 0.7–4.0)
MCH: 29.9 pg (ref 26.0–34.0)
MCHC: 32.9 g/dL (ref 30.0–36.0)
MCV: 90.7 fL (ref 80.0–100.0)
Monocytes Absolute: 0.5 10*3/uL (ref 0.1–1.0)
Monocytes Relative: 8 %
Neutro Abs: 3.3 10*3/uL (ref 1.7–7.7)
Neutrophils Relative %: 50 %
Platelets: 260 10*3/uL (ref 150–400)
RBC: 3.55 MIL/uL — ABNORMAL LOW (ref 4.22–5.81)
RDW: 12.3 % (ref 11.5–15.5)
Smear Review: NORMAL
WBC: 6.8 10*3/uL (ref 4.0–10.5)
nRBC: 0 % (ref 0.0–0.2)

## 2023-08-19 LAB — BLOOD GAS, VENOUS
Acid-base deficit: 1.1 mmol/L (ref 0.0–2.0)
Bicarbonate: 24.3 mmol/L (ref 20.0–28.0)
O2 Saturation: 87.5 %
Patient temperature: 37
pCO2, Ven: 42 mmHg — ABNORMAL LOW (ref 44–60)
pH, Ven: 7.37 (ref 7.25–7.43)
pO2, Ven: 54 mmHg — ABNORMAL HIGH (ref 32–45)

## 2023-08-19 LAB — CBG MONITORING, ED
Glucose-Capillary: 500 mg/dL — ABNORMAL HIGH (ref 70–99)
Glucose-Capillary: 372 mg/dL — ABNORMAL HIGH (ref 70–99)

## 2023-08-19 MED ORDER — METFORMIN HCL 500 MG PO TABS
1000.0000 mg | ORAL_TABLET | ORAL | Status: AC
  Administered 2023-08-19: 1000 mg via ORAL
  Filled 2023-08-19: qty 2

## 2023-08-19 MED ORDER — LACTATED RINGERS IV BOLUS
1000.0000 mL | Freq: Once | INTRAVENOUS | Status: AC
  Administered 2023-08-19: 1000 mL via INTRAVENOUS

## 2023-08-19 MED ORDER — METFORMIN HCL 1000 MG PO TABS
1000.0000 mg | ORAL_TABLET | Freq: Two times a day (BID) | ORAL | 1 refills | Status: DC
  Filled 2023-11-26: qty 60, 30d supply, fill #0

## 2023-08-19 MED ORDER — GLIPIZIDE 10 MG PO TABS
10.0000 mg | ORAL_TABLET | Freq: Every day | ORAL | 1 refills | Status: DC
  Filled 2023-11-26: qty 30, 30d supply, fill #0

## 2023-08-19 NOTE — Discharge Instructions (Signed)
 As we discussed, though your blood sugar is running high, it is not dangerous at this time.  Making adjustments in the Emergency Department (ED) and possibly causing your glucose level to drop too low is more dangerous than continuing your current medications at this time until you can follow up with your clinic doctor.  However, we refilled your medications and encourage you to follow-up with your regular doctor as scheduled or at the next available opportunity.  Please continue your medications and follow up with your regular doctor as recommended in these documents.  If you develop new or worsening symptoms that concern you, please return to the Emergency Department.

## 2023-08-19 NOTE — ED Provider Notes (Addendum)
 [facility] Provider Note    Event Date/Time   First MD Initiated Contact with Patient 08/19/23 [phone]     (approximate)   History   Hyperglycemia   HPI Richard Estes is a 63 y.o. male who presents for evaluation of high blood sugar, increased thirst, and increased urination.  He ran out of his glipizide  and metformin .  He has an appointment with his doctor in a few weeks but is concerned that he may be having a complication from his high blood sugar and does not want to get more sick.  No pain.  No difficulty breathing.     Physical Exam   Triage Vital Signs: ED Triage Vitals [08/19/23 0214]  Encounter Vitals Group     BP (!) 131/90     Girls Systolic BP Percentile      Girls Diastolic BP Percentile      Boys Systolic BP Percentile      Boys Diastolic BP Percentile      Pulse Rate 88     Resp 16     Temp 97.9 F (36.6 C)     Temp src      SpO2 100 %     Weight      Height      Head Circumference      Peak Flow      Pain Score      Pain Loc      Pain Education      Exclude from Growth Chart     Most recent vital signs: Vitals:   08/19/23 0214  BP: (!) 131/90  Pulse: 88  Resp: 16  Temp: 97.9 F (36.6 C)  SpO2: 100%    General: Awake, no distress.  Pleasant and conversant. CV:  Good peripheral perfusion.  Regular rate and rhythm. Resp:  Normal effort. Speaking easily and comfortably, no accessory muscle usage nor intercostal retractions.   Abd:  No distention.  No tenderness to palpation of the abdomen.   ED Results / Procedures / Treatments   Labs (all labs ordered are listed, but only abnormal results are displayed) Labs Reviewed  CBC WITH DIFFERENTIAL/PLATELET - Abnormal; Notable for the following components:      Result Value   RBC 3.55 (*)    Hemoglobin 10.6 (*)    HCT 32.2 (*)    All other components within normal limits  COMPREHENSIVE METABOLIC PANEL WITH GFR - Abnormal; Notable for the following components:    Sodium 129 (*)    Chloride 97 (*)    Glucose, Bld 493 (*)    Calcium 8.5 (*)    Albumin 3.1 (*)    AST 86 (*)    ALT 73 (*)    All other components within normal limits  BLOOD GAS, VENOUS - Abnormal; Notable for the following components:   pCO2, Ven 42 (*)    pO2, Ven 54 (*)    All other components within normal limits  CBG MONITORING, ED - Abnormal; Notable for the following components:   Glucose-Capillary 500 (*)    All other components within normal limits  CBG MONITORING, ED - Abnormal; Notable for the following components:   Glucose-Capillary 372 (*)    All other components within normal limits     PROCEDURES:  Critical Care performed: No  Procedures    IMPRESSION / MDM / ASSESSMENT AND PLAN / ED COURSE  I reviewed the triage vital signs and the nursing notes.  Differential diagnosis includes, but is not limited to, hyperglycemia, DKA, HHS, renal dysfunction.  Patient's presentation is most consistent with acute presentation with potential threat to life or bodily function.  Labs/studies ordered: CMP, CBC with differential, VBG  Interventions/Medications given:  Medications  lactated ringers  bolus 1,000 mL (0 mLs Intravenous Stopped 08/19/23 0458)  lactated ringers  bolus 1,000 mL (0 mLs Intravenous Stopped 08/19/23 0458)  metFORMIN  (GLUCOPHAGE ) tablet 1,000 mg (1,000 mg Oral Given 08/19/23 0321)    (Note:  hospital course my include additional interventions and/or labs/studies not listed above.)   CMP shows minimal LFT elevation but with no abdominal pain or tenderness.  Pseudohyponatremia with a blood glucose of around 500.  VBG is essentially normal with no evidence of acidosis.  CBC is also essentially normal.  I had my usual and customary hyperglycemia discussion with the patient.  I agreed to start him back on his oral medications after verifying the doses and we gave him a dose of 1000 mg of metformin  tonight with a meal.  I  provided 2 L of IV fluids and he showed an appropriate decrease in his glucose level.  The patient said he will definitely follow-up as an outpatient and pick up his medications.  The patient's medical screening exam is reassuring with no indication of an emergent medical condition requiring hospitalization or additional evaluation at this point.  The patient is safe and appropriate for discharge and outpatient follow up.  Clinical Course as of 08/19/23 0528  Wed Aug 19, 2023  0528 Glucose-Capillary(!): 372 [CF]    Clinical Course User Index [CF] Gordan Huxley, MD     FINAL CLINICAL IMPRESSION(S) / ED DIAGNOSES   Final diagnoses:  Hyperglycemia     Rx / DC Orders   ED Discharge Orders          Ordered    glipiZIDE  (GLUCOTROL ) 10 MG tablet  Daily before breakfast        08/19/23 0311    metFORMIN  (GLUCOPHAGE ) 1000 MG tablet  2 times daily with meals        08/19/23 9688             Note:  This document was prepared using Dragon voice recognition software and may include unintentional dictation errors.   Gordan Huxley, MD 08/19/23 9565    Gordan Huxley, MD 08/19/23 [phone]

## 2023-08-19 NOTE — ED Notes (Signed)
 Pt given meal tray.

## 2023-08-19 NOTE — ED Triage Notes (Signed)
 Pt reports he has been out of his metformin  for the past week. Pt states he has had inc urinary frequency, vision changes, diarrhea and feeling fatigued.

## 2023-08-25 NOTE — Congregational Nurse Program (Signed)
  Dept: [phone]   Congregational Nurse Program Note  Date of Encounter: 08/25/2023 Client to [facility] day center, nurse led clinic with his medications related to his [facility] ER visit on 7/2. Client was prescribed metformin  1000 mg bid and glipizide  10 mg daily. He reports he has taken his pills this morning and that he has the metformin  dose for this evening. He declined having his glucose checked as he was ready to take a nap. RN will continue to attempt education regarding the importance of glucose monitoring and medication adherence. RN also discussed some mail the client brought. One of which was the lab results from his PCP visit. RN encouraged client to contact his PCP as directed in the letter. Client does have a follow up apt on 7/16, and he will wait until then to discuss his results. MARLA Marina BSN, RN Past Medical History: Past Medical History:  Diagnosis Date   Alcohol abuse    Diabetes mellitus without complication [facility])     Encounter Details:  Community Questionnaire - 08/25/23 1015       Questionnaire   Ask client: Do you give verbal consent for me to treat you today? Yes    Student Assistance N/A    Location Patient Served  [facility]    Encounter Setting CN site    Population Status Unknown   curently staying with his son   Insurance Medicaid   BS/BC through the exchange   Insurance/Financial Assistance Referral N/A   RN to assist with Medicaid application.   Medication Have Medication Insecurities    [facility]   Screening Referrals Made N/A    Medical Referrals Made [facility]   [facility] call placed for [facility]   Medical Appointment Completed [facility]    CNP Interventions Educate;Advocate/Support    Screenings CN Performed N/A    ED Visit Averted N/A    Life-Saving Intervention Made N/A

## 2023-08-28 NOTE — Progress Notes (Unsigned)
 Complex Care Management Note Care Guide Note  08/28/2023 Name: Richard Estes MRN: [id number] DOB: 05/19/1960   Complex Care Management Outreach Attempts: A second unsuccessful outreach was attempted today to offer the patient with information about available complex care management services.  Follow Up Plan:  Additional outreach attempts will be made to offer the patient complex care management information and services.   Encounter Outcome:  No Answer  Dreama [facility]  [facility] Care Management Assistant Direct Dial: [phone]  Fax: [phone]

## 2023-08-31 NOTE — Progress Notes (Signed)
 Complex Care Management Note Care Guide Note  08/31/2023 Name: Richard Estes MRN: [id number] DOB: 11/24/1960   Complex Care Management Outreach Attempts: A third unsuccessful outreach was attempted today to offer the patient with information about available complex care management services.  Follow Up Plan:  No further outreach attempts will be made at this time. We have been unable to contact the patient to offer or enroll patient in complex care management services.  Encounter Outcome:  Will close request as unable to contact.  Dreama [facility]  [facility], [facility] Health Care Management Assistant Direct Dial: [phone]  Fax: [phone]

## 2023-09-02 ENCOUNTER — Ambulatory Visit: Payer: MEDICAID | Admitting: Family Medicine

## 2023-09-04 ENCOUNTER — Emergency Department
Admission: EM | Admit: 2023-09-04 | Discharge: 2023-09-04 | Disposition: A | Discharge status: Home / Self Care | Payer: MEDICAID | Attending: Emergency Medicine | Admitting: Emergency Medicine

## 2023-09-04 ENCOUNTER — Other Ambulatory Visit: Payer: Self-pay

## 2023-09-04 ENCOUNTER — Encounter: Payer: Self-pay | Admitting: Emergency Medicine

## 2023-09-04 ENCOUNTER — Emergency Department: Payer: MEDICAID

## 2023-09-04 DIAGNOSIS — R197 Diarrhea, unspecified: Secondary | ICD-10-CM | POA: Insufficient documentation

## 2023-09-04 DIAGNOSIS — E119 Type 2 diabetes mellitus without complications: Secondary | ICD-10-CM | POA: Insufficient documentation

## 2023-09-04 DIAGNOSIS — R059 Cough, unspecified: Secondary | ICD-10-CM | POA: Diagnosis not present

## 2023-09-04 DIAGNOSIS — Z0000 Encounter for general adult medical examination without abnormal findings: Secondary | ICD-10-CM | POA: Insufficient documentation

## 2023-09-04 LAB — CBC WITH DIFFERENTIAL/PLATELET
Abs Immature Granulocytes: 0.02 K/uL (ref 0.00–0.07)
Basophils Absolute: 0 K/uL (ref 0.0–0.1)
Basophils Relative: 1 %
Eosinophils Absolute: 0.1 K/uL (ref 0.0–0.5)
Eosinophils Relative: 2 %
HCT: 33.2 % — ABNORMAL LOW (ref 39.0–52.0)
Hemoglobin: 11.2 g/dL — ABNORMAL LOW (ref 13.0–17.0)
Immature Granulocytes: 0 %
Lymphocytes Relative: 36 %
Lymphs Abs: 2.2 K/uL (ref 0.7–4.0)
MCH: 29.6 pg (ref 26.0–34.0)
MCHC: 33.7 g/dL (ref 30.0–36.0)
MCV: 87.8 fL (ref 80.0–100.0)
Monocytes Absolute: 0.5 K/uL (ref 0.1–1.0)
Monocytes Relative: 9 %
Neutro Abs: 3.1 K/uL (ref 1.7–7.7)
Neutrophils Relative %: 52 %
Platelets: 192 K/uL (ref 150–400)
RBC: 3.78 MIL/uL — ABNORMAL LOW (ref 4.22–5.81)
RDW: 12.1 % (ref 11.5–15.5)
WBC: 6.1 K/uL (ref 4.0–10.5)
nRBC: 0 % (ref 0.0–0.2)

## 2023-09-04 LAB — BASIC METABOLIC PANEL WITH GFR
Anion gap: 10 (ref 5–15)
BUN: 6 mg/dL — ABNORMAL LOW (ref 8–23)
CO2: 23 mmol/L (ref 22–32)
Calcium: 9 mg/dL (ref 8.9–10.3)
Chloride: 101 mmol/L (ref 98–111)
Creatinine, Ser: 0.5 mg/dL — ABNORMAL LOW (ref 0.61–1.24)
GFR, Estimated: >60 mL/min (ref >60)
Glucose, Bld: 113 mg/dL — ABNORMAL HIGH (ref 70–99)
Potassium: 3.2 mmol/L — ABNORMAL LOW (ref 3.5–5.1)
Sodium: 134 mmol/L — ABNORMAL LOW (ref 135–145)

## 2023-09-04 NOTE — Discharge Instructions (Signed)
 Please follow-up with your primary care provider as needed outpatient for ongoing management of your diabetes.

## 2023-09-04 NOTE — ED Triage Notes (Signed)
 Pt presents to the ED via [facility] for a CBG check - 180 with EMS. Pt still requesting to be seen at the ED for wellness visit. A&Ox4 at this time. Denies CP or SOB.

## 2023-09-04 NOTE — ED Provider Notes (Signed)
 [facility] Provider Note    Event Date/Time   First MD Initiated Contact with Patient 09/04/23 [phone]     (approximate)   History   Wellness Check   HPI Richard Estes is a 63 y.o. male presenting today for wellness check.  Patient has a history of DM2, alcohol abuse.  Patient states he called EMS to check his blood sugar.  They noted it was 180 at that time.  He still wanted to come to the ED for a wellness check.  He notes he has been eating a lot of sweets recently.  Had an episode of diarrhea earlier today and also having a cough.  Denies chest pain, shortness of breath, numbness, weakness, abdominal pain, vomiting, nausea.  States that he did drink 3 beers today.  Also smokes tobacco daily.     Physical Exam   Triage Vital Signs: ED Triage Vitals  Encounter Vitals Group     BP 09/04/23 0214 (!) 124/91     Girls Systolic BP Percentile --      Girls Diastolic BP Percentile --      Boys Systolic BP Percentile --      Boys Diastolic BP Percentile --      Pulse Rate 09/04/23 0214 95     Resp 09/04/23 0214 16     Temp 09/04/23 0214 98.5 F (36.9 C)     Temp Source 09/04/23 0214 Oral     SpO2 09/04/23 0214 96 %     Weight 09/04/23 0210 137 lb 2 oz (62.2 kg)     Height 09/04/23 0210 5' 9 (1.753 m)     Head Circumference --      Peak Flow --      Pain Score 09/04/23 0210 0     Pain Loc --      Pain Education --      Exclude from Growth Chart --     Most recent vital signs: Vitals:   09/04/23 0214  BP: (!) 124/91  Pulse: 95  Resp: 16  Temp: 98.5 F (36.9 C)  SpO2: 96%   I have reviewed the vital signs. General:  Awake, alert, no acute distress. Head:  Normocephalic, Atraumatic. EENT:  PERRL, EOMI, Oral mucosa pink and moist, Neck is supple. Cardiovascular: Regular rate, 2+ distal pulses. Respiratory:  Normal respiratory effort, symmetrical expansion, no distress.  No crackles, rhonchi, wheezing. Abdomen: Soft, nontender,  nondistended Extremities:  Moving all four extremities through full ROM without pain.   Neuro:  Alert and oriented.  Interacting appropriately.   Skin:  Warm, dry, no rash.   Psych: Appropriate affect.    ED Results / Procedures / Treatments   Labs (all labs ordered are listed, but only abnormal results are displayed) Labs Reviewed  CBC WITH DIFFERENTIAL/PLATELET - Abnormal; Notable for the following components:      Result Value   RBC 3.78 (*)    Hemoglobin 11.2 (*)    HCT 33.2 (*)    All other components within normal limits  BASIC METABOLIC PANEL WITH GFR - Abnormal; Notable for the following components:   Sodium 134 (*)    Potassium 3.2 (*)    Glucose, Bld 113 (*)    BUN 6 (*)    Creatinine, Ser 0.50 (*)    All other components within normal limits     EKG    RADIOLOGY Independently interpreted chest x-ray with no acute pathology   PROCEDURES:  Critical Care performed: No  Procedures   MEDICATIONS ORDERED IN ED: Medications - No data to display   IMPRESSION / MDM / ASSESSMENT AND PLAN / ED COURSE  I reviewed the triage vital signs and the nursing notes.                              Differential diagnosis includes, but is not limited to, hyperglycemia, poor self-care, wellness check, alcohol abuse, pneumonia  Patient's presentation is most consistent with acute complicated illness / injury requiring diagnostic workup.  Patient is a 63 year old male presenting today for wellness check.  EMS report his blood sugar with them was 180 and he still wanted to come to the ED for further evaluation.  He notes eating a lot of sweets recently and wanted to have it checked out to make sure it was nothing more severe.  Also had a dry cough.  No other associated symptoms.  Physical exam is unremarkable.  Has been drinking alcohol today but not significantly intoxicated on exam.  Vital signs otherwise stable.  Chest x-ray negative.  Laboratory workup otherwise reassuring.   Patient with no other acute complaints at this time and is stable for discharge.  Will have him follow-up with his PCP.  Clinical Course as of 09/04/23 0542  Fri Sep 04, 2023  0442 DG Chest 2 View No pneumonia [DW]  0539 Basic metabolic panel(!) No concerning findings [DW]    Clinical Course User Index [DW] Malvina Alm DASEN, MD     FINAL CLINICAL IMPRESSION(S) / ED DIAGNOSES   Final diagnoses:  Adult general medical exam     Rx / DC Orders   ED Discharge Orders     None        Note:  This document was prepared using Dragon voice recognition software and may include unintentional dictation errors.   Malvina Alm DASEN, MD 09/04/23 [phone]

## 2023-09-25 ENCOUNTER — Ambulatory Visit: Payer: MEDICAID | Admitting: Family Medicine

## 2023-09-29 ENCOUNTER — Encounter: Payer: Self-pay | Admitting: Family Medicine

## 2023-10-02 ENCOUNTER — Encounter: Payer: Self-pay | Admitting: Family Medicine

## 2023-10-08 DIAGNOSIS — E119 Type 2 diabetes mellitus without complications: Secondary | ICD-10-CM

## 2023-10-08 LAB — GLUCOSE, POCT (MANUAL RESULT ENTRY): POC Glucose: 380 mg/dL — AB (ref 70–99)

## 2023-10-08 NOTE — Congregational Nurse Program (Signed)
  Dept: [phone]   Congregational Nurse Program Note  Date of Encounter: 10/08/2023 Client to [facility] day center, nurse led clinic with request for blood sugar check. Client reports he has been in [facility] for the last 2 months. He stated he hadn't taken his oral antidiabetic medications today as he had left them in [place]. Non-fasting blood sugar 380. Client has a current prescription form the [facility] ER for metformin  1000 mg BID and glipizide  10 mg daily. Client reports he will go back to [facility] today. RN to attempt to provide continued education regarding management of his diabetes. MARLA Marina BSN, RN Past Medical History: Past Medical History:  Diagnosis Date   Alcohol abuse    Diabetes mellitus without complication (HCC)     Encounter Details:

## 2023-10-11 ENCOUNTER — Other Ambulatory Visit: Payer: Self-pay

## 2023-10-11 ENCOUNTER — Encounter: Payer: Self-pay | Admitting: *Deleted

## 2023-10-11 ENCOUNTER — Emergency Department
Admission: EM | Admit: 2023-10-11 | Discharge: 2023-10-11 | Disposition: A | Discharge status: Home / Self Care | Payer: MEDICAID | Attending: Emergency Medicine | Admitting: Emergency Medicine

## 2023-10-11 DIAGNOSIS — Z760 Encounter for issue of repeat prescription: Secondary | ICD-10-CM | POA: Diagnosis not present

## 2023-10-11 DIAGNOSIS — R5383 Other fatigue: Secondary | ICD-10-CM | POA: Insufficient documentation

## 2023-10-11 DIAGNOSIS — R739 Hyperglycemia, unspecified: Secondary | ICD-10-CM

## 2023-10-11 DIAGNOSIS — E1165 Type 2 diabetes mellitus with hyperglycemia: Secondary | ICD-10-CM | POA: Diagnosis not present

## 2023-10-11 DIAGNOSIS — R200 Anesthesia of skin: Secondary | ICD-10-CM | POA: Insufficient documentation

## 2023-10-11 DIAGNOSIS — R531 Weakness: Secondary | ICD-10-CM | POA: Diagnosis not present

## 2023-10-11 DIAGNOSIS — E119 Type 2 diabetes mellitus without complications: Secondary | ICD-10-CM

## 2023-10-11 LAB — URINALYSIS, ROUTINE W REFLEX MICROSCOPIC
Bacteria, UA: NONE SEEN
Bilirubin Urine: NEGATIVE
Glucose, UA: >=500 mg/dL — AB
Hgb urine dipstick: NEGATIVE
Ketones, ur: NEGATIVE mg/dL
Leukocytes,Ua: NEGATIVE
Nitrite: NEGATIVE
Protein, ur: NEGATIVE mg/dL
Specific Gravity, Urine: 1.016 (ref 1.005–1.030)
pH: 5 (ref 5.0–8.0)

## 2023-10-11 LAB — CBC
HCT: 32.5 % — ABNORMAL LOW (ref 39.0–52.0)
Hemoglobin: 10.9 g/dL — ABNORMAL LOW (ref 13.0–17.0)
MCH: 28.8 pg (ref 26.0–34.0)
MCHC: 33.5 g/dL (ref 30.0–36.0)
MCV: 86 fL (ref 80.0–100.0)
Platelets: 216 K/uL (ref 150–400)
RBC: 3.78 MIL/uL — ABNORMAL LOW (ref 4.22–5.81)
RDW: 11.9 % (ref 11.5–15.5)
WBC: 8 K/uL (ref 4.0–10.5)
nRBC: 0 % (ref 0.0–0.2)

## 2023-10-11 LAB — BASIC METABOLIC PANEL WITH GFR
Anion gap: 12 (ref 5–15)
BUN: 6 mg/dL — ABNORMAL LOW (ref 8–23)
CO2: 23 mmol/L (ref 22–32)
Calcium: 8.9 mg/dL (ref 8.9–10.3)
Chloride: 95 mmol/L — ABNORMAL LOW (ref 98–111)
Creatinine, Ser: 0.62 mg/dL (ref 0.61–1.24)
GFR, Estimated: >60 mL/min (ref >60)
Glucose, Bld: 225 mg/dL — ABNORMAL HIGH (ref 70–99)
Potassium: 3.8 mmol/L (ref 3.5–5.1)
Sodium: 130 mmol/L — ABNORMAL LOW (ref 135–145)

## 2023-10-11 LAB — CBG MONITORING, ED: Glucose-Capillary: 232 mg/dL — ABNORMAL HIGH (ref 70–99)

## 2023-10-11 MED ORDER — METFORMIN HCL 1000 MG PO TABS
1000.0000 mg | ORAL_TABLET | Freq: Two times a day (BID) | ORAL | 0 refills | Status: DC
  Filled 2023-10-11: qty 60, 30d supply, fill #0

## 2023-10-11 MED ORDER — GLIPIZIDE 10 MG PO TABS
10.0000 mg | ORAL_TABLET | Freq: Every day | ORAL | 0 refills | Status: DC
  Filled 2023-10-11: qty 30, 30d supply, fill #0

## 2023-10-11 NOTE — Discharge Instructions (Signed)
 Take medications as prescribed.  Follow-up with your doctor.  Thank you for choosing us  for your health care today!  Please see your primary doctor this week for a follow up appointment.   If you have any new, worsening, or unexpected symptoms call your doctor right away or come back to the emergency department for reevaluation.  It was my pleasure to care for you today.   Ginnie EDISON Cyrena, MD

## 2023-10-11 NOTE — ED Notes (Signed)
 Pt to desk asking for something to eat said he was supposed to be getting a [place] sandwich, informed him that we don't have any mal[place] sandwiches.

## 2023-10-11 NOTE — ED Provider Notes (Signed)
 [facility] Provider Note    Event Date/Time   First MD Initiated Contact with Patient 10/11/23 [phone]     (approximate)   History   Diabetes   HPI  Richard Estes is a 63 y.o. male   Past medical history of type 2 diabetes, alcohol use, presents to the emergency department for medication refill, generalized weakness, fatigue.  He states that he is visiting his family here in [place] area and is originally from [place].  He states that all of his medications are [facility] and he has been without his medications for approximately 3 days.  In the meantime he has been feeling fatigued, generalized weakness and is asking for food.  He denies any focal pain.  He states that he has numbness in the left arm occasionally, chronic and unchanged.  It is worse with his blood sugars high and when he sleeps on it.  He reports no other motor or sensory changes and no other acute medical complaints.  He has asked for mal[place] sandwich multiple times during our interview.  He drank alcohol earlier last night.   External Medical Documents Reviewed: Recent outpatient note from 10/08/2023 regarding his medication management and attempts to get prescriptions      Physical Exam   Triage Vital Signs: ED Triage Vitals  Encounter Vitals Group     BP 10/11/23 0151 (!) 131/97     Girls Systolic BP Percentile --      Girls Diastolic BP Percentile --      Boys Systolic BP Percentile --      Boys Diastolic BP Percentile --      Pulse Rate 10/11/23 0151 90     Resp 10/11/23 0151 16     Temp 10/11/23 0151 97.7 F (36.5 C)     Temp Source 10/11/23 0151 Oral     SpO2 10/11/23 0151 100 %     Weight --      Height --      Head Circumference --      Peak Flow --      Pain Score 10/11/23 0152 0     Pain Loc --      Pain Education --      Exclude from Growth Chart --     Most recent vital signs: Vitals:   10/11/23 0151  BP: (!) 131/97  Pulse: 90  Resp: 16   Temp: 97.7 F (36.5 C)  SpO2: 100%    General: Awake, no distress.  CV:  Good peripheral perfusion.  Resp:  Normal effort.  Abd:  No distention.  Other:  Sleeping but wakes to converse, cooperative and appropriate.  No facial asymmetry dysarthria focal weakness, moving all extremities with full active range of motion.  Hemodynamic significant only for slight hypertension.  His subjective numbness to left arm from the elbow downward.  ED Results / Procedures / Treatments   Labs (all labs ordered are listed, but only abnormal results are displayed) Labs Reviewed  BASIC METABOLIC PANEL WITH GFR - Abnormal; Notable for the following components:      Result Value   Sodium 130 (*)    Chloride 95 (*)    Glucose, Bld 225 (*)    BUN 6 (*)    All other components within normal limits  CBC - Abnormal; Notable for the following components:   RBC 3.78 (*)    Hemoglobin 10.9 (*)    HCT 32.5 (*)    All other  components within normal limits  URINALYSIS, ROUTINE W REFLEX MICROSCOPIC - Abnormal; Notable for the following components:   Color, Urine YELLOW (*)    APPearance CLEAR (*)    Glucose, UA >=500 (*)    All other components within normal limits  CBG MONITORING, ED - Abnormal; Notable for the following components:   Glucose-Capillary 232 (*)    All other components within normal limits  CBG MONITORING, ED     I ordered and reviewed the above labs they are notable for blood sugar in the 200s with no anion gap elevation, normal bicarb    PROCEDURES:  Critical Care performed: No  Procedures   MEDICATIONS ORDERED IN ED: Medications - No data to display   IMPRESSION / MDM / ASSESSMENT AND PLAN / ED COURSE  I reviewed the triage vital signs and the nursing notes.                                Patient's presentation is most consistent with severe exacerbation of chronic illness.  Differential diagnosis includes, but is not limited to, hyperglycemic crisis, DKA,  electrolyte derangement, intoxication, considered CVA    MDM:    Here is a patient requesting medication refills and I have given him a prescription for his glipizide  and metformin .  He is hyperglycemic but no evidence of DKA.  He has generalized weakness and fatigue and drank alcohol earlier in his requesting food.  He has no other complaints aside from chronic unchanged left arm/hand numbness and no other focal neurologic deficits to suggest stroke at this time.  I will give him prescription and he can discharge tonight and follow-up with his PMD.       FINAL CLINICAL IMPRESSION(S) / ED DIAGNOSES   Final diagnoses:  Other fatigue  Hyperglycemia  Encounter for medication refill     Rx / DC Orders   ED Discharge Orders          Ordered    glipiZIDE  (GLUCOTROL ) 10 MG tablet  Daily before breakfast        10/11/23 0452    metFORMIN  (GLUCOPHAGE ) 1000 MG tablet  2 times daily with meals        10/11/23 0452             Note:  This document was prepared using Dragon voice recognition software and may include unintentional dictation errors.    Cyrena Mylar, MD 10/11/23 [phone]

## 2023-10-11 NOTE — ED Notes (Signed)
 Pt given mal[place] sandwich lunch bag, ice chips, and diet soda. Pt also escorted out to lobby in wheelchair.

## 2023-10-11 NOTE — ED Triage Notes (Addendum)
 See first nurse note.   Pt states that he is visiting  and has not had his metformin  for 2 days.  He does have the medication at home in [place].    Pt reports that he feels that he has generalized weakness and Pt states that he has had some numbness in his left hand since he stopped taking his medication 2 days ago and he states that this occurs every time he is sick.  No other focal neuro deficits. Pt is alert and oriented at this time.  Pt reports that he is an alcoholic and he had his last drink a couple of hours ago when he had a few beers.

## 2023-10-11 NOTE — ED Notes (Signed)
 Pt was eating candy in triage.  Helped back out to lobby in wheelchair

## 2023-10-11 NOTE — ED Triage Notes (Signed)
 FIRST NURSE NOTE:  [place] [facility] [place] police station, out of metformin  x 2 days, L hand numbness x 1.5 days, weak,  135/93 P89 99% RA CBG 235  + etoh use, last beer couple of hours ago.  Pt also requesting food and ice water

## 2023-10-12 ENCOUNTER — Other Ambulatory Visit: Payer: Self-pay

## 2023-10-15 ENCOUNTER — Other Ambulatory Visit: Payer: Self-pay

## 2023-10-15 NOTE — Congregational Nurse Program (Signed)
  Dept: [phone]   Congregational Nurse Program Note  Date of Encounter: 10/15/2023 Client to [facility] day center, nurse led clinic, as follow up from recent [facility] ER visit. Client had not yet picked up the medications that were ordered. Metformin  and glipizide . Client reports he will be picking them up today, he does have a ride. RN contacted the [facility] community pharmacy to make sure the medications were ready. Client also requested that RN make him a hospital follow up appointment with his PCP, as directed on his hospital discharge ppwk. Apt for 8/29 at 1:20 at [facility] with Michelene Cower NP made. Appointment card given to client. He reports he will have a ride. MARLA Marina BSN, RN Past Medical History: Past Medical History:  Diagnosis Date   Alcohol abuse    Diabetes mellitus without complication [facility])     Encounter Details:  Community Questionnaire - 10/15/23 1020       Questionnaire   Ask client: Do you give verbal consent for me to treat you today? Yes    Student Assistance N/A    Location Patient Served  [facility]    Encounter Setting CN site    Population Status Unknown   curently staying with his son   Insurance Medicaid   BS/BC through the exchange   Insurance/Financial Assistance Referral N/A   RN to assist with Medicaid application.   Medication Have Medication Insecurities    [facility]   Screening Referrals Made N/A    Medical Referrals Made [facility]   [facility] call placed for [facility]   Medical Appointment Completed N/A    CNP Interventions Educate;Advocate/Support;Navigate Healthcare System    Screenings CN Performed N/A    ED Visit Averted N/A    Life-Saving Intervention Made N/A

## 2023-10-16 ENCOUNTER — Inpatient Hospital Stay: Payer: MEDICAID | Admitting: Family Medicine

## 2023-10-16 DIAGNOSIS — E1165 Type 2 diabetes mellitus with hyperglycemia: Secondary | ICD-10-CM

## 2023-10-16 DIAGNOSIS — Z09 Encounter for follow-up examination after completed treatment for conditions other than malignant neoplasm: Secondary | ICD-10-CM

## 2023-10-16 NOTE — Progress Notes (Deleted)
 Name: Richard Estes   MRN: [id number]    DOB: Sep 13, 1960   Date:10/16/2023       Progress Note  No chief complaint on file.    Subjective:   Richard Estes is a 63 y.o. male, presents to clinic for routine follow up on chronic conditions     Current Outpatient Medications:    albuterol  (VENTOLIN  HFA) 108 (90 Base) MCG/ACT inhaler, Inhale 2 puffs into the lungs every 6 (six) hours as needed for shortness of breath or wheezing. (Patient not taking: Reported on 08/12/2023), Disp: , Rfl:    Blood Glucose Monitoring Suppl (BLOOD GLUCOSE MONITOR SYSTEM) w/Device KIT, Use 3 (three) times daily to check blood sugar, Disp: 1 kit, Rfl: 0   Blood Glucose Monitoring Suppl (TRUE METRIX METER) w/Device KIT, use to check blood glucose, Disp: 1 kit, Rfl: 0   EPINEPHrine  0.3 mg/0.3 mL IJ SOAJ injection, Inject 0.3 mg into the muscle as needed for anaphylaxis., Disp: 2 each, Rfl: 5   glipiZIDE  (GLUCOTROL ) 10 MG tablet, Take 1 tablet (10 mg total) by mouth daily before breakfast., Disp: 30 tablet, Rfl: 1   glipiZIDE  (GLUCOTROL ) 10 MG tablet, Take 1 tablet (10 mg total) by mouth daily before breakfast., Disp: 30 tablet, Rfl: 0   Glucose Blood (BLOOD GLUCOSE TEST STRIPS) STRP, Use as directed to check blood sugar 4 (four) times daily., Disp: 100 each, Rfl: 0   Glucose Blood (BLOOD GLUCOSE TEST STRIPS) STRP, Use 3 (three) times daily. Use as directed to check blood sugar., Disp: 100 strip, Rfl: 0   Lancet Device MISC, Use 3 (three) times daily to check blood sugar, Disp: 1 each, Rfl: 0   Lancets MISC, Use 3 (three) times daily. Use as directed to check blood sugar., Disp: 100 each, Rfl: 0   metFORMIN  (GLUCOPHAGE ) 1000 MG tablet, Take 1 tablet (1,000 mg total) by mouth 2 (two) times daily with a meal., Disp: 60 tablet, Rfl: 1   metFORMIN  (GLUCOPHAGE ) 1000 MG tablet, Take 1 tablet (1,000 mg total) by mouth 2 (two) times daily with a meal., Disp: 60 tablet, Rfl: 0   TRUEplus Lancets 33G MISC, Use as  directed to check blood sugar up 4 (four) times daily., Disp: 100 each, Rfl: 0  Patient Active Problem List   Diagnosis Date Noted   Hepatitis C virus infection without hepatic coma 08/18/2023   Smoking greater than 20 pack years 08/13/2023   Elevated LFTs 08/13/2023   Acute GI bleeding 06/19/2023   Anal ulcer 06/19/2023   Lower GI bleeding 06/19/2023   Rectal bleeding 06/12/2023   Uncontrolled type 2 diabetes mellitus with hyperglycemia, without long-term current use of insulin  (HCC) 06/11/2023   Unhoused person 06/11/2023   Hemorrhoids, internal, with bleeding 06/11/2023   ABLA (acute blood loss anemia) 06/11/2023   Hypokalemia 06/11/2023   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   Encounter to establish care 04/03/2022   Current smoker 04/03/2022   Alcohol abuse 04/03/2022    Past Surgical History:  Procedure Laterality Date   RECTAL EXAM UNDER ANESTHESIA N/A 06/11/2023   Procedure: EXAM UNDER ANESTHESIA, RECTUM;  Surgeon: Rodolph Romano, MD;  Location: [facility];  Service: General;  Laterality: N/A;   RECTAL EXAM UNDER ANESTHESIA N/A 06/19/2023   Procedure: EXAM UNDER ANESTHESIA, RECTUM;  Surgeon: Desiderio Schanz, MD;  Location: [facility];  Service: General;  Laterality: N/A;    Family History  Problem Relation Age of Onset   Arthritis Mother     Social  History   Tobacco Use   Smoking status: Every Day    Current packs/day: 1.00    Average packs/day: 1 pack/day for 40.2 years (40.2 ttl pk-yrs)    Types: Cigarettes    Start date: 08/12/1983   Smokeless tobacco: Never   Tobacco comments:    Sometimes list  Vaping Use   Vaping status: Never Used  Substance Use Topics   Alcohol use: Yes    Comment: all i can get every day-i am an alcoholic   Drug use: No    Comment: last used marijuana 08/09/16     Allergies  Allergen Reactions   Other Anaphylaxis   Bee Venom     Health Maintenance  Topic Date Due   FOOT EXAM  Never done   OPHTHALMOLOGY EXAM   Never done   Diabetic kidney evaluation - Urine ACR  Never done   Colonoscopy  Never done   Lung Cancer Screening  Never done   COVID-19 Vaccine (2 - 2024-25 season) 10/31/2023 (Originally 10/19/2022)   Zoster Vaccines- Shingrix (1 of 2) 11/12/2023 (Originally 08/30/2010)   INFLUENZA VACCINE  05/17/2024 (Originally 09/18/2023)   Pneumococcal Vaccine: 50+ Years (1 of 2 - PCV) 10/14/2024 (Originally 08/30/1979)   HEMOGLOBIN A1C  02/11/2024   Diabetic kidney evaluation - eGFR measurement  10/10/2024   DTaP/Tdap/Td (2 - Td or Tdap) 10/27/2032   Hepatitis C Screening  Completed   HIV Screening  Completed   Hepatitis B Vaccines 19-59 Average Risk  Aged Out   HPV VACCINES  Aged Out   Meningococcal B Vaccine  Aged Out    Chart Review Today: ***  Review of Systems   Objective:   There were no vitals filed for this visit.  There is no height or weight on file to calculate BMI.  Physical Exam   Functional Status Survey:   Results for orders placed or performed during the hospital encounter of 10/11/23  CBG monitoring, ED   Collection Time: 10/11/23  1:47 AM  Result Value Ref Range   Glucose-Capillary 232 (H) 70 - 99 mg/dL  Basic metabolic panel   Collection Time: 10/11/23  1:53 AM  Result Value Ref Range   Sodium 130 (L) 135 - 145 mmol/L   Potassium 3.8 3.5 - 5.1 mmol/L   Chloride 95 (L) 98 - 111 mmol/L   CO2 23 22 - 32 mmol/L   Glucose, Bld 225 (H) 70 - 99 mg/dL   BUN 6 (L) 8 - 23 mg/dL   Creatinine, Ser 9.37 0.61 - 1.24 mg/dL   Calcium 8.9 8.9 - 89.6 mg/dL   GFR, Estimated >39 >39 mL/min   Anion gap 12 5 - 15  CBC   Collection Time: 10/11/23  1:53 AM  Result Value Ref Range   WBC 8.0 4.0 - 10.5 K/uL   RBC 3.78 (L) 4.22 - 5.81 MIL/uL   Hemoglobin 10.9 (L) 13.0 - 17.0 g/dL   HCT 67.4 (L) 60.9 - 47.9 %   MCV 86.0 80.0 - 100.0 fL   MCH 28.8 26.0 - 34.0 pg   MCHC 33.5 30.0 - 36.0 g/dL   RDW 88.0 88.4 - 84.4 %   Platelets 216 150 - 400 K/uL   nRBC 0.0 0.0 - 0.2 %   Urinalysis, Routine w reflex microscopic -Urine, Clean Catch   Collection Time: 10/11/23  1:53 AM  Result Value Ref Range   Color, Urine YELLOW (A) YELLOW   APPearance CLEAR (A) CLEAR   Specific Gravity, Urine 1.016 1.005 -  1.030   pH 5.0 5.0 - 8.0   Glucose, UA >=500 (A) NEGATIVE mg/dL   Hgb urine dipstick NEGATIVE NEGATIVE   Bilirubin Urine NEGATIVE NEGATIVE   Ketones, ur NEGATIVE NEGATIVE mg/dL   Protein, ur NEGATIVE NEGATIVE mg/dL   Nitrite NEGATIVE NEGATIVE   Leukocytes,Ua NEGATIVE NEGATIVE   RBC / HPF 0-5 0 - 5 RBC/hpf   WBC, UA 6-10 0 - 5 WBC/hpf   Bacteria, UA NONE SEEN NONE SEEN   Squamous Epithelial / HPF 0-5 0 - 5 /HPF      Assessment & Plan:   Hospital discharge follow-up  Uncontrolled type 2 diabetes mellitus with hyperglycemia, with long-term current use of insulin  (HCC)     No follow-ups on file.   Michelene Cower, PA-C 10/16/23 12:31 PM

## 2023-10-21 LAB — GLUCOSE, POCT (MANUAL RESULT ENTRY): POC Glucose: 482 mg/dL — AB (ref 70–99)

## 2023-10-21 NOTE — Congregational Nurse Program (Signed)
  Dept: [phone]   Congregational Nurse Program Note  Date of Encounter: 10/21/2023 Client to [facility] day center, nurse led clinic for blood glucose check and to pick up his medications RN picked up at the [facility] pharmacy. Nonfasting blood sugar 482. Client has not been taking his metformin  or glipizide . He was to have picked it up last week, but did not. He is unclear as to how long ago he had taken it and does not take it regularly. RN has educated client at each visit about the importance of taking his medications to help control his blood sugar. He voices understanding, however, he does not have the ability to follow through. RN to continue to provide education and support. MARLA Marina BSN, RN Past Medical History: Past Medical History:  Diagnosis Date   Alcohol abuse    Diabetes mellitus without complication [facility])     Encounter Details:  Community Questionnaire - 10/21/23 1035       Questionnaire   Ask client: Do you give verbal consent for me to treat you today? Yes    Student Assistance N/A    Location Patient Served  [facility]    Encounter Setting CN site    Population Status Unknown   curently staying with his son   Insurance Medicaid   BS/BC through the exchange   Insurance/Financial Assistance Referral N/A   RN to assist with Medicaid application.   Medication Have Medication Insecurities    [facility] Provider [facility]   Screening Referrals Made N/A    Medical Referrals Made N/A   [facility] for [facility]   Medical Appointment Completed N/A    CNP Interventions Educate;Advocate/Support    Screenings CN Performed N/A    ED Visit Averted N/A    Life-Saving Intervention Made N/A

## 2023-10-28 NOTE — Congregational Nurse Program (Signed)
  Dept: [phone]   Congregational Nurse Program Note  Date of Encounter: 10/28/2023 Client to [facility] with request for a med box fill. Medication box filled for the week with his metformin  and glipizide . MARLA Marina BSN, RN Past Medical History: Past Medical History:  Diagnosis Date   Alcohol abuse    Diabetes mellitus without complication [facility])     Encounter Details:  Community Questionnaire - 10/28/23 1036       Questionnaire   Ask client: Do you give verbal consent for me to treat you today? Yes    Student Assistance N/A    Location Patient Served  [facility]    Encounter Setting CN site    Population Status Unknown   curently staying with his son   Insurance Medicaid   BS/BC through the exchange   Insurance/Financial Assistance Referral N/A   RN to assist with Medicaid application.   Medication Have Medication Insecurities    Medical Provider [facility]   Screening Referrals Made N/A    Medical Referrals Made N/A   [facility]. call placed for apt   Medical Appointment Completed N/A    CNP Interventions Educate;Advocate/Support    Screenings CN Performed N/A    ED Visit Averted N/A    Life-Saving Intervention Made N/A

## 2023-11-17 LAB — GLUCOSE, POCT (MANUAL RESULT ENTRY): POC Glucose: 146 mg/dL — AB (ref 70–99)

## 2023-11-17 NOTE — Congregational Nurse Program (Signed)
  Dept: [phone]   Congregational Nurse Program Note  Date of Encounter: 11/17/2023 Client into nurse only clinic requesting assistance setting up weekly med box and glucose screening. States he has been taking meds regularly this week. Client very excited glucose was 146 nonfasting. Reinforced success and importance of eating balance of food. To RTC prn and 1 week to set up meds. RJHermann, RN Past Medical History: Past Medical History:  Diagnosis Date   Alcohol abuse    Diabetes mellitus without complication [facility])     Encounter Details:  Community Questionnaire - 11/17/23 1130       Questionnaire   Ask client: Do you give verbal consent for me to treat you today? Yes    Student Assistance N/A    Location Patient Served  [facility]    Encounter Setting CN site    Population Status Unknown   curently staying with his son   Insurance Medicaid   BS/BC through the exchange   Insurance/Financial Assistance Referral N/A   RN to assist with Medicaid application.   Medication Have Medication Insecurities    Medical Provider [facility]   Screening Referrals Made N/A    Medical Referrals Made N/A   [facility]. call placed for apt   Medical Appointment Completed N/A    CNP Interventions Educate;Advocate/Support    Screenings CN Performed Blood Glucose    ED Visit Averted N/A    Life-Saving Intervention Made N/A

## 2023-11-26 ENCOUNTER — Other Ambulatory Visit: Payer: Self-pay | Admitting: Family Medicine

## 2023-11-26 ENCOUNTER — Other Ambulatory Visit: Payer: Self-pay

## 2023-11-26 DIAGNOSIS — E119 Type 2 diabetes mellitus without complications: Secondary | ICD-10-CM

## 2023-11-27 NOTE — Telephone Encounter (Signed)
 Requested medications are due for refill today.  yes  Requested medications are on the active medications list.  yes  Last refill. Glipizide  10/11/2023 #30 0 rf - rx written to expire 11/19/2023. Metformin  08/19/2023 #60 1 rf  Future visit scheduled.   no  Notes to clinic.  Pt missed last appt. Only seen 1 time to establish care.     Requested Prescriptions  Pending Prescriptions Disp Refills   glipiZIDE  (GLUCOTROL ) 10 MG tablet 30 tablet 0    Sig: Take 1 tablet (10 mg total) by mouth daily before breakfast.     Endocrinology:  Diabetes - Sulfonylureas Failed - 11/27/2023  5:55 PM      Failed - HBA1C is between 0 and 7.9 and within 180 days    Hgb A1c MFr Bld  Date Value Ref Range Status  08/12/2023 8.1 (H) <5.7 % Final    Comment:    For someone without known diabetes, a hemoglobin A1c value of 6.5% or greater indicates that they may have  diabetes and this should be confirmed with a follow-up  test. . For someone with known diabetes, a value <7% indicates  that their diabetes is well controlled and a value  greater than or equal to 7% indicates suboptimal  control. A1c targets should be individualized based on  duration of diabetes, age, comorbid conditions, and  other considerations. . Currently, no consensus exists regarding use of hemoglobin A1c for diagnosis of diabetes for children. .          Passed - Cr in normal range and within 360 days    Creat  Date Value Ref Range Status  08/12/2023 0.66 (L) 0.70 - 1.35 mg/dL Final   Creatinine, Ser  Date Value Ref Range Status  10/11/2023 0.62 0.61 - 1.24 mg/dL Final         Passed - Valid encounter within last 6 months    Recent Outpatient Visits           3 months ago Encounter for medical examination to establish care   [facility] Tapia, Leisa, PA-C               metFORMIN  (GLUCOPHAGE ) 1000 MG tablet 60 tablet 0    Sig: Take 1 tablet (1,000 mg total) by mouth 2 (two) times  daily with a meal.     Endocrinology:  Diabetes - Biguanides Failed - 11/27/2023  5:55 PM      Failed - HBA1C is between 0 and 7.9 and within 180 days    Hgb A1c MFr Bld  Date Value Ref Range Status  08/12/2023 8.1 (H) <5.7 % Final    Comment:    For someone without known diabetes, a hemoglobin A1c value of 6.5% or greater indicates that they may have  diabetes and this should be confirmed with a follow-up  test. . For someone with known diabetes, a value <7% indicates  that their diabetes is well controlled and a value  greater than or equal to 7% indicates suboptimal  control. A1c targets should be individualized based on  duration of diabetes, age, comorbid conditions, and  other considerations. . Currently, no consensus exists regarding use of hemoglobin A1c for diagnosis of diabetes for children. .          Passed - Cr in normal range and within 360 days    Creat  Date Value Ref Range Status  08/12/2023 0.66 (L) 0.70 - 1.35 mg/dL Final   Creatinine, Ser  Date Value Ref Range Status  10/11/2023 0.62 0.61 - 1.24 mg/dL Final         Passed - eGFR in normal range and within 360 days    EGFR (African American)  Date Value Ref Range Status  06/11/2014 >60  Final   GFR calc Af Amer  Date Value Ref Range Status  12/25/2016 115 >59 mL/min/1.73 Final   EGFR (Non-African Amer.)  Date Value Ref Range Status  06/11/2014 >60  Final    Comment:    eGFR values <33mL/min/1.73 m2 may be an indication of chronic kidney disease (CKD). Calculated eGFR is useful in patients with stable renal function. The eGFR calculation will not be reliable in acutely ill patients when serum creatinine is changing rapidly. It is not useful in patients on dialysis. The eGFR calculation may not be applicable to patients at the low and high extremes of body sizes, pregnant women, and vegetarians.    GFR, Estimated  Date Value Ref Range Status  10/11/2023 >60 >60 mL/min Final    Comment:     (NOTE) Calculated using the CKD-EPI Creatinine Equation (2021)    eGFR  Date Value Ref Range Status  08/12/2023 106 > OR = 60 mL/min/1.82m2 Final         Passed - B12 Level in normal range and within 720 days    Vitamin B-12  Date Value Ref Range Status  06/11/2023 447 180 - 914 pg/mL Final    Comment:    (NOTE) This assay is not validated for testing neonatal or myeloproliferative syndrome specimens for Vitamin B12 levels. Performed at [facility], [address] [place], [place] [address]          Passed - Valid encounter within last 6 months    Recent Outpatient Visits           3 months ago Encounter for medical examination to establish care   [facility] Leavy Mole, [place]              Passed - CBC within normal limits and completed in the last 12 months    WBC  Date Value Ref Range Status  10/11/2023 8.0 4.0 - 10.5 K/uL Final   RBC  Date Value Ref Range Status  10/11/2023 3.78 (L) 4.22 - 5.81 MIL/uL Final   Hemoglobin  Date Value Ref Range Status  10/11/2023 10.9 (L) 13.0 - 17.0 g/dL Final  88/91/7981 86.6 13.0 - 17.7 g/dL Final   HCT  Date Value Ref Range Status  10/11/2023 32.5 (L) 39.0 - 52.0 % Final   Hematocrit  Date Value Ref Range Status  12/25/2016 37.0 (L) 37.5 - 51.0 % Final   MCHC  Date Value Ref Range Status  10/11/2023 33.5 30.0 - 36.0 g/dL Final   [facility]  Date Value Ref Range Status  10/11/2023 28.8 26.0 - 34.0 pg Final   MCV  Date Value Ref Range Status  10/11/2023 86.0 80.0 - 100.0 fL Final  12/25/2016 83 79 - 97 fL Final  06/11/2014 89 80 - 100 fL Final   No results found for: PLTCOUNTKUC, LABPLAT, POCPLA RDW  Date Value Ref Range Status  10/11/2023 11.9 11.5 - 15.5 % Final  12/25/2016 12.9 12.3 - 15.4 % Final  06/11/2014 12.8 11.5 - 14.5 % Final

## 2023-12-17 DIAGNOSIS — E119 Type 2 diabetes mellitus without complications: Secondary | ICD-10-CM

## 2023-12-17 LAB — GLUCOSE, POCT (MANUAL RESULT ENTRY): POC Glucose: 320 mg/dL — AB (ref 70–99)

## 2023-12-17 NOTE — Congregational Nurse Program (Signed)
  Dept: [phone]   Congregational Nurse Program Note  Date of Encounter: 12/17/2023 Client to [facility] with request for blood sugar check. Post prandial blood sugar 320. Client does not take his oral anti-diabetic medications consistently and has difficulty managing his diet. Client does not come to the center daily and isn't always agreeable to having his blood sugar checked. RN will continue to attempt to provide education regarding the importance of medication adherence and the need for diet changes. MARLA Marina BSN, RN Past Medical History: Past Medical History:  Diagnosis Date   Alcohol abuse    Diabetes mellitus without complication [facility])     Encounter Details:  Community Questionnaire - 12/17/23 1309       Questionnaire   Ask client: Do you give verbal consent for me to treat you today? Yes    Student Assistance N/A    Location Patient Served  [facility]    Encounter Setting CN site    Population Status Unknown   curently staying with his son   Insurance Medicaid   BS/BC through the exchange   Insurance/Financial Assistance Referral N/A   RN to assist with Medicaid application.   Medication Have Medication Insecurities    [facility]   Screening Referrals Made N/A    Medical Referrals Made N/A   [facility] for apt   Medical Appointment Completed N/A    CNP Interventions Educate;Advocate/Support    Screenings CN Performed Blood Glucose    ED Visit Averted N/A    Life-Saving Intervention Made N/A

## 2023-12-21 ENCOUNTER — Other Ambulatory Visit: Payer: Self-pay

## 2023-12-21 ENCOUNTER — Emergency Department: Payer: MEDICAID

## 2023-12-21 ENCOUNTER — Observation Stay
Admission: EM | Admit: 2023-12-21 | Discharge: 2023-12-22 | Disposition: A | Discharge status: Home / Self Care | Payer: MEDICAID | Attending: Internal Medicine | Admitting: Internal Medicine

## 2023-12-21 DIAGNOSIS — Z7982 Long term (current) use of aspirin: Secondary | ICD-10-CM | POA: Diagnosis not present

## 2023-12-21 DIAGNOSIS — F17210 Nicotine dependence, cigarettes, uncomplicated: Secondary | ICD-10-CM | POA: Insufficient documentation

## 2023-12-21 DIAGNOSIS — G459 Transient cerebral ischemic attack, unspecified: Secondary | ICD-10-CM | POA: Diagnosis not present

## 2023-12-21 DIAGNOSIS — R531 Weakness: Secondary | ICD-10-CM | POA: Insufficient documentation

## 2023-12-21 DIAGNOSIS — F10129 Alcohol abuse with intoxication, unspecified: Secondary | ICD-10-CM | POA: Insufficient documentation

## 2023-12-21 DIAGNOSIS — K118 Other diseases of salivary glands: Secondary | ICD-10-CM | POA: Insufficient documentation

## 2023-12-21 DIAGNOSIS — Y902 Blood alcohol level of 40-59 mg/100 ml: Secondary | ICD-10-CM | POA: Diagnosis not present

## 2023-12-21 DIAGNOSIS — Z794 Long term (current) use of insulin: Secondary | ICD-10-CM | POA: Insufficient documentation

## 2023-12-21 DIAGNOSIS — F10929 Alcohol use, unspecified with intoxication, unspecified: Secondary | ICD-10-CM | POA: Insufficient documentation

## 2023-12-21 DIAGNOSIS — K119 Disease of salivary gland, unspecified: Secondary | ICD-10-CM

## 2023-12-21 DIAGNOSIS — F1910 Other psychoactive substance abuse, uncomplicated: Secondary | ICD-10-CM | POA: Insufficient documentation

## 2023-12-21 DIAGNOSIS — E119 Type 2 diabetes mellitus without complications: Secondary | ICD-10-CM | POA: Insufficient documentation

## 2023-12-21 DIAGNOSIS — Z79899 Other long term (current) drug therapy: Secondary | ICD-10-CM | POA: Insufficient documentation

## 2023-12-21 DIAGNOSIS — Z7901 Long term (current) use of anticoagulants: Secondary | ICD-10-CM | POA: Insufficient documentation

## 2023-12-21 DIAGNOSIS — F10920 Alcohol use, unspecified with intoxication, uncomplicated: Secondary | ICD-10-CM

## 2023-12-21 DIAGNOSIS — R29898 Other symptoms and signs involving the musculoskeletal system: Secondary | ICD-10-CM

## 2023-12-21 DIAGNOSIS — R202 Paresthesia of skin: Secondary | ICD-10-CM | POA: Diagnosis present

## 2023-12-21 DIAGNOSIS — F1920 Other psychoactive substance dependence, uncomplicated: Secondary | ICD-10-CM | POA: Diagnosis not present

## 2023-12-21 DIAGNOSIS — I639 Cerebral infarction, unspecified: Secondary | ICD-10-CM

## 2023-12-21 LAB — DIFFERENTIAL
Abs Immature Granulocytes: 0.01 K/uL (ref 0.00–0.07)
Basophils Absolute: 0 K/uL (ref 0.0–0.1)
Basophils Relative: 1 %
Eosinophils Absolute: 0 K/uL (ref 0.0–0.5)
Eosinophils Relative: 1 %
Immature Granulocytes: 0 %
Lymphocytes Relative: 47 %
Lymphs Abs: 2.5 K/uL (ref 0.7–4.0)
Monocytes Absolute: 0.4 K/uL (ref 0.1–1.0)
Monocytes Relative: 8 %
Neutro Abs: 2.3 K/uL (ref 1.7–7.7)
Neutrophils Relative %: 43 %
Smear Review: NORMAL

## 2023-12-21 LAB — CBC
HCT: 35.6 % — ABNORMAL LOW (ref 39.0–52.0)
Hemoglobin: 11.6 g/dL — ABNORMAL LOW (ref 13.0–17.0)
MCH: 29.2 pg (ref 26.0–34.0)
MCHC: 32.6 g/dL (ref 30.0–36.0)
MCV: 89.7 fL (ref 80.0–100.0)
Platelets: 223 K/uL (ref 150–400)
RBC: 3.97 MIL/uL — ABNORMAL LOW (ref 4.22–5.81)
RDW: 13.2 % (ref 11.5–15.5)
WBC: 5.3 K/uL (ref 4.0–10.5)
nRBC: 0 % (ref 0.0–0.2)

## 2023-12-21 LAB — ETHANOL: Alcohol, Ethyl (B): 48 mg/dL — ABNORMAL HIGH (ref <15)

## 2023-12-21 LAB — CBG MONITORING, ED: Glucose-Capillary: 155 mg/dL — ABNORMAL HIGH (ref 70–99)

## 2023-12-21 LAB — PROTIME-INR
INR: 1 (ref 0.8–1.2)
Prothrombin Time: 13.9 s (ref 11.4–15.2)

## 2023-12-21 LAB — APTT: aPTT: 29 s (ref 24–36)

## 2023-12-21 MED ORDER — INSULIN ASPART 100 UNIT/ML IJ SOLN
0.0000 [IU] | Freq: Every day | INTRAMUSCULAR | Status: DC
  Filled 2023-12-21: qty 1

## 2023-12-21 MED ORDER — ASPIRIN 81 MG PO TBEC
81.0000 mg | DELAYED_RELEASE_TABLET | Freq: Every day | ORAL | Status: DC
Start: 2023-12-22 — End: 2023-12-22
  Administered 2023-12-22: 81 mg via ORAL
  Filled 2023-12-21: qty 1

## 2023-12-21 MED ORDER — ACETAMINOPHEN 650 MG RE SUPP: 650.0000 mg | Freq: Four times a day (QID) | RECTAL | Status: DC | PRN

## 2023-12-21 MED ORDER — INSULIN ASPART 100 UNIT/ML IJ SOLN: 0.0000 [IU] | Freq: Three times a day (TID) | INTRAMUSCULAR | Status: DC

## 2023-12-21 MED ORDER — STROKE: EARLY STAGES OF RECOVERY BOOK: Freq: Once | Status: AC

## 2023-12-21 MED ORDER — SODIUM CHLORIDE 0.9 % IV SOLN: INTRAVENOUS | Status: DC

## 2023-12-21 MED ORDER — TRAZODONE HCL 50 MG PO TABS: 25.0000 mg | ORAL_TABLET | Freq: Every evening | ORAL | Status: DC | PRN

## 2023-12-21 MED ORDER — CLOPIDOGREL BISULFATE 75 MG PO TABS
75.0000 mg | ORAL_TABLET | Freq: Every day | ORAL | Status: DC
  Administered 2023-12-22: 75 mg via ORAL
  Filled 2023-12-21: qty 1

## 2023-12-21 MED ORDER — GLIPIZIDE 10 MG PO TABS
10.0000 mg | ORAL_TABLET | Freq: Every day | ORAL | Status: DC
  Administered 2023-12-22: 10 mg via ORAL
  Filled 2023-12-21: qty 1

## 2023-12-21 MED ORDER — MAGNESIUM HYDROXIDE 400 MG/5ML PO SUSP: 30.0000 mL | Freq: Every day | ORAL | Status: DC | PRN

## 2023-12-21 MED ORDER — ACETAMINOPHEN 325 MG PO TABS: 650.0000 mg | ORAL_TABLET | Freq: Four times a day (QID) | ORAL | Status: DC | PRN

## 2023-12-21 MED ORDER — EPINEPHRINE 0.3 MG/0.3ML IJ SOAJ: 0.3000 mg | INTRAMUSCULAR | Status: DC | PRN

## 2023-12-21 MED ORDER — CLOPIDOGREL BISULFATE 75 MG PO TABS
300.0000 mg | ORAL_TABLET | Freq: Once | ORAL | Status: AC
Start: 2023-12-21 — End: 2023-12-21
  Administered 2023-12-21: 300 mg via ORAL
  Filled 2023-12-21: qty 4

## 2023-12-21 MED ORDER — ONDANSETRON HCL 4 MG PO TABS: 4.0000 mg | ORAL_TABLET | Freq: Four times a day (QID) | ORAL | Status: DC | PRN

## 2023-12-21 MED ORDER — ASPIRIN 81 MG PO CHEW
324.0000 mg | CHEWABLE_TABLET | Freq: Once | ORAL | Status: AC
  Administered 2023-12-21: 324 mg via ORAL
  Filled 2023-12-21: qty 4

## 2023-12-21 MED ORDER — ONDANSETRON HCL 4 MG/2ML IJ SOLN: 4.0000 mg | Freq: Four times a day (QID) | INTRAMUSCULAR | Status: DC | PRN

## 2023-12-21 MED ORDER — ENOXAPARIN SODIUM 40 MG/0.4ML IJ SOSY
40.0000 mg | PREFILLED_SYRINGE | INTRAMUSCULAR | Status: DC
  Administered 2023-12-22: 40 mg via SUBCUTANEOUS
  Filled 2023-12-21: qty 0.4

## 2023-12-21 NOTE — ED Provider Notes (Signed)
 [facility] Provider Note    None    (approximate)   History   Weakness  HPI  Richard Estes is a 63 y.o. male history of diabetes, reports he is now on metformin  as well as a sulfonylurea and has his medication.  He went to the police department because he started feeling weak.  On further history taking he reports that about 6 PM he started noticing he was having numbness in his left hand and a little bit in his left leg.  He also feels slightly weak in his left arm.  Symptoms started at 6 PM and he was well up until then.  No headache no falls no chest pain.  He relates that he thought maybe it could be his blood sugar but that got checked and it was okay.  So he thought he better come to the hospital because he is not sure why his hand feels numb and a little bit weak.  Denies this has happened before.  Does use alcohol from time to time  No slurred speech.  Not noticed any facial droop.  Denies numbness on the face     Physical Exam   Triage Vital Signs: ED Triage Vitals [12/21/23 2103]  Encounter Vitals Group     BP      Girls Systolic BP Percentile      Girls Diastolic BP Percentile      Boys Systolic BP Percentile      Boys Diastolic BP Percentile      Pulse      Resp      Temp      Temp src      SpO2      Weight      Height      Head Circumference      Peak Flow      Pain Score 0     Pain Loc      Pain Education      Exclude from Growth Chart     Most recent vital signs: Vitals:   12/21/23 2110  BP: (!) 143/95  Pulse: 73  Resp: 18  Temp: 97.8 F (36.6 C)  SpO2: 100%     General: Awake, no distress.  Normocephalic atraumatic pleasant ambulates with steady gait. CV:  Good peripheral perfusion.  Normal tones and rate Resp:  Normal effort.  Clear bilateral Abd:  No distention.  Soft nontender Other:  Full strength in lower extremities without drift but he reports slight decrease in sensation of the left leg.  Reports  some more dense feeling of loss of sensation but still able to discern some in the left hand.  Normal sensation right sided.  Normal sensation on the face bilateral.  He has very mild drift in the left arm only.  Code stroke activated by me at 9:07 PM   ED Results / Procedures / Treatments   Labs (all labs ordered are listed, but only abnormal results are displayed) Labs Reviewed  CBC - Abnormal; Notable for the following components:      Result Value   RBC 3.97 (*)    Hemoglobin 11.6 (*)    HCT 35.6 (*)    All other components within normal limits  ETHANOL - Abnormal; Notable for the following components:   Alcohol, Ethyl (B) 48 (*)    All other components within normal limits  CBG MONITORING, ED - Abnormal; Notable for the following components:   Glucose-Capillary 155 (*)  All other components within normal limits  PROTIME-INR  APTT  DIFFERENTIAL  URINE DRUG SCREEN, QUALITATIVE ([facility] ONLY)  COMPREHENSIVE METABOLIC PANEL WITH GFR     EKG   And inter by me at 2135 heart rate 70 QRS 110 QTc 470, slight artifact normal sinus rhythm no frank evidence of ischemia. [, No patient has no chest pain no thoracic complaint]  RADIOLOGY  CT head interpreted by me as grossly negative discussed with radiologist  CT HEAD CODE STROKE WO CONTRAST (LKW 0-4.5h, LVO 0-24h) Result Date: 12/21/2023 EXAM: CT HEAD WITHOUT CONTRAST 12/21/2023 09:18:21 PM TECHNIQUE: CT of the head was performed without the administration of intravenous contrast. Automated exposure control, iterative reconstruction, and/or weight based adjustment of the mA/kV was utilized to reduce the radiation dose to as low as reasonably achievable. COMPARISON: CT head 10/28/2022 CLINICAL HISTORY: Neuro deficit, acute, stroke suspected. FINDINGS: BRAIN AND VENTRICLES: No acute hemorrhage. No evidence of acute infarct. Remote right basal ganglia lacunar infarct. No hydrocephalus. No extra-axial collection. No mass effect or  midline shift. ORBITS: No acute abnormality. SINUSES: No acute abnormality. SOFT TISSUES AND SKULL: No skull fracture. Findings discussed with Dr. Dicky via telephone at 9:33 PM. IMPRESSION: 1. No evidence of acute intracranial abnormality. Electronically signed by: Gilmore Molt MD 12/21/2023 09:33 PM EST RP Workstation: [id number]      PROCEDURES:  Critical Care performed: Yes, see critical care procedure note(s)  CRITICAL CARE Performed by: Oneil Dicky   Total critical care time: 35 minutes  Critical care time was exclusive of separately billable procedures and treating other patients.  Critical care was necessary to treat or prevent imminent or life-threatening deterioration.  Critical care was time spent personally by me on the following activities: development of treatment plan with patient and/or surrogate as well as nursing, discussions with consultants, evaluation of patient's response to treatment, examination of patient, obtaining history from patient or surrogate, ordering and performing treatments and interventions, ordering and review of laboratory studies, ordering and review of radiographic studies, pulse oximetry and re-evaluation of patient's condition.   Procedures   MEDICATIONS ORDERED IN ED: Medications  aspirin chewable tablet 324 mg (324 mg Oral Given 12/21/23 2231)  clopidogrel (PLAVIX) tablet 300 mg (300 mg Oral Given 12/21/23 2231)     IMPRESSION / MDM / ASSESSMENT AND PLAN / ED COURSE  I reviewed the triage vital signs and the nursing notes.                              Differential diagnosis includes, but is not limited to, possible stroke peripheral neuropathy, central demyelinating lesion though seems less likely, metabolic anomaly electrolyte disorder, relation to possible substance use, etc.  No acute cardiac symptoms ECG reassuring.  CBC reassuring.  Slightly elevated ethanol level  Discussed with neurologist, recommending aspirin and Plavix load  and admission for neurology/possible stroke or TIA workup. Discussed with the patient he is understanding agreeable to plan.  Currently resting comfortably.   Patient's presentation is most consistent with acute presentation with potential threat to life or bodily function.   The patient is on the cardiac monitor to evaluate for evidence of arrhythmia and/or significant heart rate changes.   Clinical Course as of 12/21/23 2335  Mon Dec 21, 2023  2104 Patient ambulatory back and forth the bathroom fully oriented without distress at this time [MQ]  2158 Discussed with teleneurologist, he advises that the patient is not a thrombolytic candidate, symptoms  are very mild in addition exam seems variable, and free test probability for stroke seems relatively low based on his assessment.  He does however advise further workup including hospitalization, MRI and possible TIA type stroke workup.  Recommends aspirin and Plavix load at this time [MQ]  2258 Patient resting at this time.  He is amenable and agreeable with plan for admission [MQ]    Clinical Course User Index [MQ] Dicky Anes, MD   Consult with hospitalist patient to be admitted to the care of Dr. Lawence  I discussed with Dr. Lawence, understanding that metabolic panel is pending climmie as had resent]  FINAL CLINICAL IMPRESSION(S) / ED DIAGNOSES   Final diagnoses:  Paresthesia  Left arm weakness     Rx / DC Orders   ED Discharge Orders     None        Note:  This document was prepared using Dragon voice recognition software and may include unintentional dictation errors.   Dicky Anes, MD 12/21/23 [phone]

## 2023-12-21 NOTE — H&P (Incomplete)
 [place]   PATIENT NAME: Richard Estes    MR#:  [id number]  DATE OF BIRTH:  1960-11-06  DATE OF ADMISSION:  12/21/2023  PRIMARY CARE PHYSICIAN: Leavy Mole, PA-C   Patient is coming from: Home  REQUESTING/REFERRING PHYSICIAN: Dicky Anes, MD  CHIEF COMPLAINT:  Left arm and hand numbness   HISTORY OF PRESENT ILLNESS:  Richard Estes is a 63 y.o. male with medical history significant for type 2 diabetes mellitus and alcohol use, who presented to the emergency room with acute onset of ED Course: *** EKG as reviewed by me : *** Imaging: *** PAST MEDICAL HISTORY:   Past Medical History:  Diagnosis Date  . Alcohol abuse   . Diabetes mellitus without complication (HCC)     PAST SURGICAL HISTORY:   Past Surgical History:  Procedure Laterality Date  . RECTAL EXAM UNDER ANESTHESIA N/A 06/11/2023   Procedure: EXAM UNDER ANESTHESIA, RECTUM;  Surgeon: Rodolph Romano, MD;  Location: [facility] ORS;  Service: General;  Laterality: N/A;  . RECTAL EXAM UNDER ANESTHESIA N/A 06/19/2023   Procedure: EXAM UNDER ANESTHESIA, RECTUM;  Surgeon: Desiderio Schanz, MD;  Location: [facility] ORS;  Service: General;  Laterality: N/A;    SOCIAL HISTORY:   Social History   Tobacco Use  . Smoking status: Every Day    Current packs/day: 1.00    Average packs/day: 1 pack/day for 40.4 years (40.4 ttl pk-yrs)    Types: Cigarettes    Start date: 08/12/1983  . Smokeless tobacco: Never  . Tobacco comments:    Sometimes list  Substance Use Topics  . Alcohol use: Yes    Comment: all i can get every day-i am an alcoholic    FAMILY HISTORY:   Family History  Problem Relation Age of Onset  . Arthritis Mother     DRUG ALLERGIES:   Allergies  Allergen Reactions  . Other Anaphylaxis  . Bee Venom     REVIEW OF SYSTEMS:   ROS As per history of present illness. All pertinent systems were reviewed above. Constitutional, HEENT, cardiovascular, respiratory, GI, GU, musculoskeletal, neuro,  psychiatric, endocrine, integumentary and hematologic systems were reviewed and are otherwise negative/unremarkable except for positive findings mentioned above in the HPI.   MEDICATIONS AT HOME:   Prior to Admission medications   Medication Sig Start Date End Date Taking? Authorizing Provider  albuterol  (VENTOLIN  HFA) 108 (90 Base) MCG/ACT inhaler Inhale 2 puffs into the lungs every 6 (six) hours as needed for shortness of breath or wheezing. Patient not taking: Reported on 08/12/2023 12/23/22 12/23/23  [provider]  Blood Glucose Monitoring Suppl (BLOOD GLUCOSE MONITOR SYSTEM) w/Device KIT Use 3 (three) times daily to check blood sugar 06/12/23   Jens Durand, MD  Blood Glucose Monitoring Suppl (TRUE METRIX METER) w/Device KIT use to check blood glucose 10/08/22   Iloabachie, Chioma E, NP  EPINEPHrine  0.3 mg/0.3 mL IJ SOAJ injection Inject 0.3 mg into the muscle as needed for anaphylaxis. 06/18/23   Iloabachie, Chioma E, NP  glipiZIDE  (GLUCOTROL ) 10 MG tablet Take 1 tablet (10 mg total) by mouth daily before breakfast. 08/19/23   Gordan Huxley, MD  glipiZIDE  (GLUCOTROL ) 10 MG tablet Take 1 tablet (10 mg total) by mouth daily before breakfast. 10/11/23 11/19/23  Cyrena Mylar, MD  Glucose Blood (BLOOD GLUCOSE TEST STRIPS) STRP Use as directed to check blood sugar 4 (four) times daily. 10/08/22   Iloabachie, Chioma E, NP  Glucose Blood (BLOOD GLUCOSE TEST STRIPS) STRP Use 3 (three)  times daily. Use as directed to check blood sugar. 06/12/23   Jens Durand, MD  Lancet Device MISC Use 3 (three) times daily to check blood sugar 06/12/23   Jens Durand, MD  Lancets MISC Use 3 (three) times daily. Use as directed to check blood sugar. 06/12/23   Jens Durand, MD  metFORMIN  (GLUCOPHAGE ) 1000 MG tablet Take 1 tablet (1,000 mg total) by mouth 2 (two) times daily with a meal. 08/19/23   Gordan Huxley, MD  metFORMIN  (GLUCOPHAGE ) 1000 MG tablet Take 1 tablet (1,000 mg total) by mouth 2 (two) times daily  with a meal. 10/11/23 11/19/23  Cyrena Mylar, MD  TRUEplus Lancets 33G MISC Use as directed to check blood sugar up 4 (four) times daily. 10/08/22   Iloabachie, Chioma E, NP      VITAL SIGNS:  Blood pressure (!) 143/95, pulse 73, temperature 97.8 F (36.6 C), temperature source Oral, resp. rate 18, SpO2 100%.  PHYSICAL EXAMINATION:  Physical Exam  GENERAL:  63 y.o.-year-old patient lying in the bed with no acute distress.  EYES: Pupils equal, round, reactive to light and accommodation. No scleral icterus. Extraocular muscles intact.  HEENT: Head atraumatic, normocephalic. Oropharynx and nasopharynx clear.  NECK:  Supple, no jugular venous distention. No thyroid enlargement, no tenderness.  LUNGS: Normal breath sounds bilaterally, no wheezing, rales,rhonchi or crepitation. No use of accessory muscles of respiration.  CARDIOVASCULAR: Regular rate and rhythm, S1, S2 normal. No murmurs, rubs, or gallops.  ABDOMEN: Soft, nondistended, nontender. Bowel sounds present. No organomegaly or mass.  EXTREMITIES: No pedal edema, cyanosis, or clubbing.  NEUROLOGIC: Cranial nerves II through XII are intact. Muscle strength 5/5 in all extremities. Sensation intact. Gait not checked.  PSYCHIATRIC: The patient is alert and oriented x 3.  Normal affect and good eye contact. SKIN: No obvious rash, lesion, or ulcer.   LABORATORY PANEL:   CBC Recent Labs  Lab 12/21/23 2139  WBC 5.3  HGB 11.6*  HCT 35.6*  PLT 223   ------------------------------------------------------------------------------------------------------------------  Chemistries  No results for input(s): NA, K, CL, CO2, GLUCOSE, BUN, CREATININE, CALCIUM, MG, AST, ALT, ALKPHOS, BILITOT in the last 168 hours.  Invalid input(s): GFRCGP ------------------------------------------------------------------------------------------------------------------  Cardiac Enzymes No results for input(s): TROPONINI in the last  168 hours. ------------------------------------------------------------------------------------------------------------------  RADIOLOGY:  CT HEAD CODE STROKE WO CONTRAST (LKW 0-4.5h, LVO 0-24h) Result Date: 12/21/2023 EXAM: CT HEAD WITHOUT CONTRAST 12/21/2023 09:18:21 PM TECHNIQUE: CT of the head was performed without the administration of intravenous contrast. Automated exposure control, iterative reconstruction, and/or weight based adjustment of the mA/kV was utilized to reduce the radiation dose to as low as reasonably achievable. COMPARISON: CT head 10/28/2022 CLINICAL HISTORY: Neuro deficit, acute, stroke suspected. FINDINGS: BRAIN AND VENTRICLES: No acute hemorrhage. No evidence of acute infarct. Remote right basal ganglia lacunar infarct. No hydrocephalus. No extra-axial collection. No mass effect or midline shift. ORBITS: No acute abnormality. SINUSES: No acute abnormality. SOFT TISSUES AND SKULL: No skull fracture. Findings discussed with Dr. Dicky via telephone at 9:33 PM. IMPRESSION: 1. No evidence of acute intracranial abnormality. Electronically signed by: Gilmore Molt MD 12/21/2023 09:33 PM EST RP Workstation: [id number]      IMPRESSION AND PLAN:  Assessment and Plan: No notes have been filed under this hospital service. Service: Hospitalist      DVT prophylaxis: Lovenox***  Advanced Care Planning:  Code Status: full code***  Family Communication:  The plan of care was discussed in details with the patient (and family). I answered all questions. The patient agreed  to proceed with the above mentioned plan. Further management will depend upon hospital course. Disposition Plan: Back to previous home environment Consults called: none***  All the records are reviewed and case discussed with ED provider.  Status is: Observation {Observation:23811}   At the time of the admission, it appears that the appropriate admission status for this patient is inpatient.  This is judged  to be reasonable and necessary in order to provide the required intensity of service to ensure the patient's safety given the presenting symptoms, physical exam findings and initial radiographic and laboratory data in the context of comorbid conditions.  The patient requires inpatient status due to high intensity of service, high risk of further deterioration and high frequency of surveillance required.  I certify that at the time of admission, it is my clinical judgment that the patient will require inpatient hospital care extending more than 2 midnights.                            Dispo: The patient is from: Home              Anticipated d/c is to: Home              Patient currently is not medically stable to d/c.              Difficult to place patient: No  Madison DELENA Peaches M.D on 12/21/2023 at 11:51 PM  [facility]   From 7 PM-7 AM, contact night-coverage [email or web address]  CC: Primary care physician; Leavy Mole, PA-C

## 2023-12-21 NOTE — ED Notes (Signed)
 CCMD called to initiate cardiac monitoring.

## 2023-12-21 NOTE — Consult Note (Signed)
 [facility] Consult Services   Patient Name:   Richard Estes, Richard Estes Date of Birth:   08/14/1960 Identification Number:   MRN - [id number] Date of Service:   12/21/2023 21:13:08  Diagnosis:       R53.1 - Weakness  Impression:      The patient comes in with left sided deficits, which are mild, and inconsistent. The patient does not seem worried about them, exchanges pleasantries, and moves his left arm normally when not examined. No disability is noted on his exam. His gait is also normal. That could indicate a mimic rather than a stroke. That being said, he does have multiple vascular risk factors, including HTN, DM, smoking, alcohol abuse and drug use, so a stroke will have to be excluded.  Based on the above, with mild non disabling symptoms, and a likelihood of an alternate diagnosis, I do not feel that TNK would be of any more benefit, and is riskier than dual antiplatelets. The latter will thus be recommended.  His presentation does not suggest an LVO.  Recommend admission with stroke work up including MRI brain, MRA head and neck, 2D echo.  Recommend dual antiplatelet therapy.  Recommend risk stratification labs with lipid profile, HbA1c. Recommend strict BP and sugar control. Recommend statin. Discussed the importance of smoking, alcohol and drugs cessation.  Needs inpatient Neurology follow up for further recommendations after these tests are done.    Our recommendations are outlined below.  Recommendations:        Stroke/Telemetry Floor       Neuro Checks (Q4)       Bedside Swallow Eval       DVT Prophylaxis       IV Fluids, Normal Saline       Head of Bed 30 Degrees       Euglycemia and Avoid Hyperthermia (PRN Acetaminophen )       Bolus with Clopidogrel 300 mg bolus x1 and initiate dual antiplatelet therapy with Aspirin 81 mg daily and Clopidogrel 75 mg daily  Sign Out:       Discussed with Emergency Department  Provider    ------------------------------------------------------------------------------  Advanced Imaging: Advanced Imaging Deferred because:  Non-disabling symptoms as verified by the patient; no cortical signs so not consistent with LVO   Metrics: Last Known Well: 12/21/2023 18:00:00 Dispatch Time: 12/21/2023 21:13:08 Arrival Time: 12/21/2023 20:51:00 Initial Response Time: 12/21/2023 21:16:46 Symptoms: Left sided numbness and weakness. Initial patient interaction: 12/21/2023 21:20:00 NIHSS Assessment Completed: 12/21/2023 21:26:32 Patient is not a candidate for Thrombolytic. Thrombolytic Medical Decision: 12/21/2023 21:26:32 Patient was not deemed candidate for Thrombolytic because of following reasons: Stroke severity too mild (non-disabling) .  CT Head: CT head unremarkable for acute infarction or hemorrhage per Radiology: No acute pathology.  Primary Provider Notified of Diagnostic Impression and Management Plan on: 12/21/2023 21:31:14    ------------------------------------------------------------------------------  History of Present Illness: Patient is a 63 year old Male.  Patient was brought by private transportation with symptoms of Left sided numbness and weakness. This is a 63 yo male with a history of HTN and DM who comes in for evaluation of left sided weakness and numbness that started at around 6pm tonight. He reports he was watching TV when he felt it. He reports his BP and sugars are usually high, but he never had a stroke before.    Past Medical History:      Hypertension      Diabetes Mellitus  Medications:  No Anticoagulant use  No Antiplatelet use  Reviewed EMR for current medications  Allergies:  Reviewed  Social History: Smoking: Yes Alcohol Use: Yes Drug Use: Yes  Family History:  There is no family history of premature cerebrovascular disease pertinent to this consultation  ROS : 14 Points Review of Systems was performed and  was negative except mentioned in HPI.  Past Surgical History: There Is No Surgical History Contributory To Today's Visit     Examination: BP(143/91), Pulse(73), Blood Glucose(99) 1A: Level of Consciousness - Alert; keenly responsive + 0 1B: Ask Month and Age - Both Questions Right + 0 1C: Blink Eyes & Squeeze Hands - Performs Both Tasks + 0 2: Test Horizontal Extraocular Movements - Normal + 0 3: Test Visual Fields - No Visual Loss + 0 4: Test Facial Palsy (Use Grimace if Obtunded) - Normal symmetry + 0 5A: Test Left Arm Motor Drift - Drift, but doesn't hit bed + 1 5B: Test Right Arm Motor Drift - No Drift for 10 Seconds + 0 6A: Test Left Leg Motor Drift - Drift, but doesn't hit bed + 1 6B: Test Right Leg Motor Drift - No Drift for 5 Seconds + 0 7: Test Limb Ataxia (FNF/Heel-Shin) - No Ataxia + 0 8: Test Sensation - Mild-Moderate Loss: Less Sharp/More Dull + 1 9: Test Language/Aphasia - Normal; No aphasia + 0 10: Test Dysarthria - Normal + 0 11: Test Extinction/Inattention - No abnormality + 0  NIHSS Score: 3  NIHSS Free Text : Left arm drifts when examined, but moves normally when he is discussing with us  and during finger to nose.    Pre-Morbid Modified Rankin Scale: 0 Points = No symptoms at all  Spoke with : Dr Dicky I reviewed the available imaging via [facility] and initiated discussion with the primary provider  This consult was conducted in real time using interactive audio and video technology. Patient was informed of the technology being used for this visit and agreed to proceed. Patient located in hospital and provider located at home/office setting.   Patient is being evaluated for possible acute neurologic impairment and high probability of imminent or life-threatening deterioration. I spent total of 45 minutes providing care to this patient, including time for face to face visit via telemedicine, review of medical records, imaging studies and discussion of findings  with providers, the patient and/or family.    Dr Cynthia Newness   [facility] For Inpatient follow-up with [facility] physician please call [facility] at [phone]. As we are not an outpatient service for any post hospital discharge needs please contact the hospital for assistance. If you have any questions for the [facility] physicians or need to reconsult for clinical or diagnostic changes please contact us  via [facility] at [phone].  Non-radiologist review of imaging performed to assist with emergent clinical decision-making. Remote physician workstations do not possess the same resolution, calibration, or diagnostic capabilities as hospital-based radiology reading stations, and formal radiologist read is necessary.   Signature : Casimer Russett

## 2023-12-21 NOTE — ED Notes (Signed)
 Code stroke button activated at 2109

## 2023-12-21 NOTE — ED Triage Notes (Addendum)
 [facility] and reported feeling tired and not eating food since 8am today. Upon arrival pt reports L arm weakness without deficits, feeling tired since 1800 today, and request for food - turkey sandwich. Speech clear and pt A&Ox4 at this time. Pt reports hx of homelessness, diabetes, and alcoholism. Reports last drink of alcohol approx four hours before arrival. Pt insisting to ambulate to bathroom and declines to give urine sample.

## 2023-12-21 NOTE — H&P (Addendum)
 [place]   PATIENT NAME: Richard Estes    MR#:  [id number]  DATE OF BIRTH:  August 27, 1960  DATE OF ADMISSION:  12/21/2023  PRIMARY CARE PHYSICIAN: Leavy Mole, PA-C   Patient is coming from: Home  REQUESTING/REFERRING PHYSICIAN: Dicky Anes, MD  CHIEF COMPLAINT:  Left arm and hand numbness and   HISTORY OF PRESENT ILLNESS:  Richard Estes is a 63 y.o. African-American male with medical history significant for type 2 diabetes mellitus alcohol use, and polysubstance abuse, who presented to the emergency room with acute onset of feeling weak and starting to have numbness in the left hand around 6 PM and to less extent in the left leg with left hand weakness.  He denies any headache or dizziness or blurred vision.  No other paresthesias or focal muscle weakness.  No dysphagia or dysarthria.  No tinnitus or vertigo.  No urinary or stool incontinence.  No witnessed seizures.  His blood glucose level was normal.  He denies any fever or chills.  No nausea or vomiting or abdominal pain.  No dysuria, oliguria or hematuria or flank pain.  ED Course: When the patient came to the ER, BP was 147/100 and vital signs otherwise were within normal.  Labs revealed hypokalemia 3.2 and AST of 122 and ALT 62 albumin 3.4.  Blood glucose was 148.  CBC showed mild anemia with hemoglobin 11.6 and hematocrit 35.6.  Alcohol level was 48 urine drug was positive for cocaine and cannabinoids. EKG as reviewed by me :  EKG shows sinus rhythm at a rate of 72 with RSR-V1 and V2 with borderline prolonged QT interval with QTc of 477 MS. Imaging: Noncontrast head CT scan revealed no evidence for acute intracranial normalities.  Teleneurology consult was obtained and aspirin and Plavix loading dose were given.  Patient will be admitted to an observation medical telemetry bed for further evaluation and management. PAST MEDICAL HISTORY:   Past Medical History:  Diagnosis Date   Alcohol abuse    Diabetes mellitus  without complication (HCC)     PAST SURGICAL HISTORY:   Past Surgical History:  Procedure Laterality Date   RECTAL EXAM UNDER ANESTHESIA N/A 06/11/2023   Procedure: EXAM UNDER ANESTHESIA, RECTUM;  Surgeon: Rodolph Romano, MD;  Location: [facility] ORS;  Service: General;  Laterality: N/A;   RECTAL EXAM UNDER ANESTHESIA N/A 06/19/2023   Procedure: EXAM UNDER ANESTHESIA, RECTUM;  Surgeon: Desiderio Schanz, MD;  Location: [facility] ORS;  Service: General;  Laterality: N/A;    SOCIAL HISTORY:   Social History   Tobacco Use   Smoking status: Every Day    Current packs/day: 1.00    Average packs/day: 1 pack/day for 40.4 years (40.4 ttl pk-yrs)    Types: Cigarettes    Start date: 08/12/1983   Smokeless tobacco: Never   Tobacco comments:    Sometimes list  Substance Use Topics   Alcohol use: Yes    Comment: all i can get every day-i am an alcoholic    FAMILY HISTORY:   Family History  Problem Relation Age of Onset   Arthritis Mother     DRUG ALLERGIES:   Allergies  Allergen Reactions   Other Anaphylaxis   Bee Venom     REVIEW OF SYSTEMS:   ROS As per history of present illness. All pertinent systems were reviewed above. Constitutional, HEENT, cardiovascular, respiratory, GI, GU, musculoskeletal, neuro, psychiatric, endocrine, integumentary and hematologic systems were reviewed and are otherwise negative/unremarkable except for positive findings mentioned  above in the HPI.   MEDICATIONS AT HOME:   Prior to Admission medications   Medication Sig Start Date End Date Taking? Authorizing Provider  albuterol  (VENTOLIN  HFA) 108 (90 Base) MCG/ACT inhaler Inhale 2 puffs into the lungs every 6 (six) hours as needed for shortness of breath or wheezing. Patient not taking: Reported on 08/12/2023 12/23/22 12/23/23  [provider]  Blood Glucose Monitoring Suppl (BLOOD GLUCOSE MONITOR SYSTEM) w/Device KIT Use 3 (three) times daily to check blood sugar 06/12/23   Jens Durand, MD   Blood Glucose Monitoring Suppl (TRUE METRIX METER) w/Device KIT use to check blood glucose 10/08/22   Iloabachie, Chioma E, NP  EPINEPHrine  0.3 mg/0.3 mL IJ SOAJ injection Inject 0.3 mg into the muscle as needed for anaphylaxis. 06/18/23   Iloabachie, Chioma E, NP  glipiZIDE  (GLUCOTROL ) 10 MG tablet Take 1 tablet (10 mg total) by mouth daily before breakfast. 08/19/23   Gordan Huxley, MD  glipiZIDE  (GLUCOTROL ) 10 MG tablet Take 1 tablet (10 mg total) by mouth daily before breakfast. 10/11/23 11/19/23  Cyrena Mylar, MD  Glucose Blood (BLOOD GLUCOSE TEST STRIPS) STRP Use as directed to check blood sugar 4 (four) times daily. 10/08/22   Iloabachie, Chioma E, NP  Glucose Blood (BLOOD GLUCOSE TEST STRIPS) STRP Use 3 (three) times daily. Use as directed to check blood sugar. 06/12/23   Jens Durand, MD  Lancet Device MISC Use 3 (three) times daily to check blood sugar 06/12/23   Jens Durand, MD  Lancets MISC Use 3 (three) times daily. Use as directed to check blood sugar. 06/12/23   Jens Durand, MD  metFORMIN  (GLUCOPHAGE ) 1000 MG tablet Take 1 tablet (1,000 mg total) by mouth 2 (two) times daily with a meal. 08/19/23   Gordan Huxley, MD  metFORMIN  (GLUCOPHAGE ) 1000 MG tablet Take 1 tablet (1,000 mg total) by mouth 2 (two) times daily with a meal. 10/11/23 11/19/23  Cyrena Mylar, MD  TRUEplus Lancets 33G MISC Use as directed to check blood sugar up 4 (four) times daily. 10/08/22   Iloabachie, Chioma E, NP      VITAL SIGNS:  Blood pressure (!) 143/95, pulse 73, temperature 97.8 F (36.6 C), temperature source Oral, resp. rate 18, SpO2 100%.  PHYSICAL EXAMINATION:  Physical Exam  GENERAL:  63 y.o.-year-old African-American male patient lying in the bed with no acute distress.  EYES: Pupils equal, round, reactive to light and accommodation. No scleral icterus. Extraocular muscles intact.  HEENT: Head atraumatic, normocephalic. Oropharynx and nasopharynx clear.  NECK:  Supple, no jugular venous distention. No  thyroid enlargement, no tenderness.  LUNGS: Normal breath sounds bilaterally, no wheezing, rales,rhonchi or crepitation. No use of accessory muscles of respiration.  CARDIOVASCULAR: Regular rate and rhythm, S1, S2 normal. No murmurs, rubs, or gallops.  ABDOMEN: Soft, nondistended, nontender. Bowel sounds present. No organomegaly or mass.  EXTREMITIES: No pedal edema, cyanosis, or clubbing.  NEUROLOGIC: Cranial nerves II through XII are intact. Muscle strength 5/5 in all extremities. Sensation intact. Gait not checked.  PSYCHIATRIC: The patient is alert and oriented x 3.  Normal affect and good eye contact. SKIN: No obvious rash, lesion, or ulcer.   LABORATORY PANEL:   CBC Recent Labs  Lab 12/21/23 2139  WBC 5.3  HGB 11.6*  HCT 35.6*  PLT 223   ------------------------------------------------------------------------------------------------------------------  Chemistries  Recent Labs  Lab 12/21/23 2322  NA 136  K 3.2*  CL 100  CO2 24  GLUCOSE 148*  BUN 9  CREATININE 0.60*  CALCIUM 8.7*  AST 122*  ALT 62*  ALKPHOS 61  BILITOT 0.5   ------------------------------------------------------------------------------------------------------------------  Cardiac Enzymes No results for input(s): TROPONINI in the last 168 hours. ------------------------------------------------------------------------------------------------------------------  RADIOLOGY:  CT HEAD CODE STROKE WO CONTRAST (LKW 0-4.5h, LVO 0-24h) Result Date: 12/21/2023 EXAM: CT HEAD WITHOUT CONTRAST 12/21/2023 09:18:21 PM TECHNIQUE: CT of the head was performed without the administration of intravenous contrast. Automated exposure control, iterative reconstruction, and/or weight based adjustment of the mA/kV was utilized to reduce the radiation dose to as low as reasonably achievable. COMPARISON: CT head 10/28/2022 CLINICAL HISTORY: Neuro deficit, acute, stroke suspected. FINDINGS: BRAIN AND VENTRICLES: No acute  hemorrhage. No evidence of acute infarct. Remote right basal ganglia lacunar infarct. No hydrocephalus. No extra-axial collection. No mass effect or midline shift. ORBITS: No acute abnormality. SINUSES: No acute abnormality. SOFT TISSUES AND SKULL: No skull fracture. Findings discussed with Dr. Dicky via telephone at 9:33 PM. IMPRESSION: 1. No evidence of acute intracranial abnormality. Electronically signed by: Gilmore Molt MD 12/21/2023 09:33 PM EST RP Workstation: [id number]      IMPRESSION AND PLAN:  Assessment and Plan: * TIA (transient ischemic attack) - We will need to rule out acute CVA. - The patient will be admitted to an observation medically monitored bed.   - We will follow neuro checks q.4 hours for 24 hours.   - The patient will be placed on aspirin.   - Will obtain a brain MRI without contrast as as well as head and neck CTA and 2D echo with bubble study .   - A neurology consultation  as well as physical/occupation/speech therapy consults will be obtained in a.m..  I notified Dr. Bertoni about the patient. - The patient will be placed on statin therapy and fasting lipids will be checked.   Polysubstance abuse (HCC) - This includes cocaine and marijuana in addition to alcohol. - He was counseled for cessation.  Alcoholic intoxication -The patient will be placed on CIWA protocol while he is here.  Type 2 diabetes mellitus without complications (HCC) - The patient will be placed on supplement coverage with NovoLog . - Will hold off metformin . - Will continue glipizide .   DVT prophylaxis: Lovenox. Advanced Care Planning:  Code Status: full code. Family Communication:  The plan of care was discussed in details with the patient (and family). I answered all questions. The patient agreed to proceed with the above mentioned plan. Further management will depend upon hospital course. Disposition Plan: Back to previous home environment Consults called: Neurology All the  records are reviewed and case discussed with ED provider.  Status is: Observation  I certify that at the time of admission, it is my clinical judgment that the patient will require hospital care extending less than 2 midnights.                            Dispo: The patient is from: Home              Anticipated d/c is to: Home              Patient currently is not medically stable to d/c.              Difficult to place patient: No  Madison DELENA Peaches M.D on 12/22/2023 at 12:25 AM  [facility]   From 7 PM-7 AM, contact night-coverage [email or web address]  CC: Primary care physician; Leavy Mole, PA-C

## 2023-12-22 ENCOUNTER — Observation Stay: Payer: MEDICAID

## 2023-12-22 ENCOUNTER — Observation Stay (HOSPITAL_BASED_OUTPATIENT_CLINIC_OR_DEPARTMENT_OTHER)
Admit: 2023-12-22 | Discharge: 2023-12-22 | Disposition: A | Discharge status: Home / Self Care | Payer: MEDICAID | Attending: Family Medicine | Admitting: Family Medicine

## 2023-12-22 ENCOUNTER — Other Ambulatory Visit: Payer: Self-pay

## 2023-12-22 DIAGNOSIS — K119 Disease of salivary gland, unspecified: Secondary | ICD-10-CM

## 2023-12-22 DIAGNOSIS — I6389 Other cerebral infarction: Secondary | ICD-10-CM | POA: Diagnosis not present

## 2023-12-22 DIAGNOSIS — R29898 Other symptoms and signs involving the musculoskeletal system: Secondary | ICD-10-CM

## 2023-12-22 DIAGNOSIS — F1910 Other psychoactive substance abuse, uncomplicated: Secondary | ICD-10-CM | POA: Insufficient documentation

## 2023-12-22 DIAGNOSIS — R202 Paresthesia of skin: Secondary | ICD-10-CM | POA: Diagnosis not present

## 2023-12-22 DIAGNOSIS — I639 Cerebral infarction, unspecified: Secondary | ICD-10-CM | POA: Diagnosis not present

## 2023-12-22 DIAGNOSIS — F10929 Alcohol use, unspecified with intoxication, unspecified: Secondary | ICD-10-CM | POA: Insufficient documentation

## 2023-12-22 DIAGNOSIS — E119 Type 2 diabetes mellitus without complications: Secondary | ICD-10-CM

## 2023-12-22 DIAGNOSIS — G459 Transient cerebral ischemic attack, unspecified: Secondary | ICD-10-CM | POA: Diagnosis not present

## 2023-12-22 LAB — GLUCOSE, CAPILLARY
Glucose-Capillary: 183 mg/dL — ABNORMAL HIGH (ref 70–99)
Glucose-Capillary: 260 mg/dL — ABNORMAL HIGH (ref 70–99)
Glucose-Capillary: 247 mg/dL — ABNORMAL HIGH (ref 70–99)

## 2023-12-22 LAB — CBC
HCT: 35.8 % — ABNORMAL LOW (ref 39.0–52.0)
Hemoglobin: 11.8 g/dL — ABNORMAL LOW (ref 13.0–17.0)
MCH: 29.2 pg (ref 26.0–34.0)
MCHC: 33 g/dL (ref 30.0–36.0)
MCV: 88.6 fL (ref 80.0–100.0)
Platelets: 262 K/uL (ref 150–400)
RBC: 4.04 MIL/uL — ABNORMAL LOW (ref 4.22–5.81)
RDW: 13.2 % (ref 11.5–15.5)
WBC: 5.4 K/uL (ref 4.0–10.5)
nRBC: 0 % (ref 0.0–0.2)

## 2023-12-22 LAB — URINE DRUG SCREEN, QUALITATIVE (ARMC ONLY)
Amphetamines, Ur Screen: NOT DETECTED
Barbiturates, Ur Screen: NOT DETECTED
Benzodiazepine, Ur Scrn: NOT DETECTED
Cannabinoid 50 Ng, Ur ~~LOC~~: POSITIVE — AB
Cocaine Metabolite,Ur ~~LOC~~: POSITIVE — AB
MDMA (Ecstasy)Ur Screen: NOT DETECTED
Methadone Scn, Ur: NOT DETECTED
Opiate, Ur Screen: NOT DETECTED
Phencyclidine (PCP) Ur S: NOT DETECTED
Tricyclic, Ur Screen: NOT DETECTED

## 2023-12-22 LAB — COMPREHENSIVE METABOLIC PANEL WITH GFR
ALT: 62 U/L — ABNORMAL HIGH (ref 0–44)
AST: 122 U/L — ABNORMAL HIGH (ref 15–41)
Albumin: 3.4 g/dL — ABNORMAL LOW (ref 3.5–5.0)
Alkaline Phosphatase: 61 U/L (ref 38–126)
Anion gap: 12 (ref 5–15)
BUN: 9 mg/dL (ref 8–23)
CO2: 24 mmol/L (ref 22–32)
Calcium: 8.7 mg/dL — ABNORMAL LOW (ref 8.9–10.3)
Chloride: 100 mmol/L (ref 98–111)
Creatinine, Ser: 0.6 mg/dL — ABNORMAL LOW (ref 0.61–1.24)
GFR, Estimated: >60 mL/min (ref >60)
Glucose, Bld: 148 mg/dL — ABNORMAL HIGH (ref 70–99)
Potassium: 3.2 mmol/L — ABNORMAL LOW (ref 3.5–5.1)
Sodium: 136 mmol/L (ref 135–145)
Total Bilirubin: 0.5 mg/dL (ref 0.0–1.2)
Total Protein: 7.8 g/dL (ref 6.5–8.1)

## 2023-12-22 LAB — BASIC METABOLIC PANEL WITH GFR
Anion gap: 9 (ref 5–15)
BUN: 12 mg/dL (ref 8–23)
CO2: 24 mmol/L (ref 22–32)
Calcium: 8.7 mg/dL — ABNORMAL LOW (ref 8.9–10.3)
Chloride: 101 mmol/L (ref 98–111)
Creatinine, Ser: 0.72 mg/dL (ref 0.61–1.24)
GFR, Estimated: >60 mL/min (ref >60)
Glucose, Bld: 256 mg/dL — ABNORMAL HIGH (ref 70–99)
Potassium: 3.5 mmol/L (ref 3.5–5.1)
Sodium: 134 mmol/L — ABNORMAL LOW (ref 135–145)

## 2023-12-22 LAB — ECHOCARDIOGRAM COMPLETE
AR max vel: 3.36 cm2
AV Area VTI: 3.17 cm2
AV Area mean vel: 2.99 cm2
AV Mean grad: 2 mmHg
AV Peak grad: 3.7 mmHg
Ao pk vel: 0.96 m/s
Area-P 1/2: 4.12 cm2
Calc EF: 60.8 %
Height: 69 in
MV VTI: 4.32 cm2
S' Lateral: 2.4 cm
Single Plane A2C EF: 61.4 %
Single Plane A4C EF: 64.4 %
Weight: 2192 [oz_av]

## 2023-12-22 LAB — HEMOGLOBIN A1C
Hgb A1c MFr Bld: 8 % — ABNORMAL HIGH (ref 4.8–5.6)
Mean Plasma Glucose: 182.9 mg/dL

## 2023-12-22 LAB — LIPID PANEL
Cholesterol: 97 mg/dL (ref 0–200)
HDL: 28 mg/dL — ABNORMAL LOW (ref >40)
LDL Cholesterol: 55 mg/dL (ref 0–99)
Total CHOL/HDL Ratio: 3.5 ratio
Triglycerides: 72 mg/dL (ref <150)
VLDL: 14 mg/dL (ref 0–40)

## 2023-12-22 MED ORDER — THIAMINE MONONITRATE 100 MG PO TABS
100.0000 mg | ORAL_TABLET | Freq: Every day | ORAL | Status: DC
  Administered 2023-12-22: 100 mg via ORAL
  Filled 2023-12-22: qty 1

## 2023-12-22 MED ORDER — INSULIN ASPART 100 UNIT/ML IJ SOLN
4.0000 [IU] | Freq: Three times a day (TID) | INTRAMUSCULAR | Status: DC
  Filled 2023-12-22: qty 1

## 2023-12-22 MED ORDER — ROSUVASTATIN CALCIUM 20 MG PO TABS
20.0000 mg | ORAL_TABLET | Freq: Every day | ORAL | 11 refills | Status: DC
  Filled 2023-12-22: qty 30, 30d supply, fill #0

## 2023-12-22 MED ORDER — VITAMIN B-1 100 MG PO TABS
100.0000 mg | ORAL_TABLET | Freq: Every day | ORAL | 1 refills | Status: DC
  Filled 2023-12-22: qty 90, 90d supply, fill #0

## 2023-12-22 MED ORDER — IOHEXOL 350 MG/ML SOLN
75.0000 mL | Freq: Once | INTRAVENOUS | Status: AC | PRN
  Administered 2023-12-22: 75 mL via INTRAVENOUS

## 2023-12-22 MED ORDER — LOSARTAN POTASSIUM 25 MG PO TABS
25.0000 mg | ORAL_TABLET | Freq: Every day | ORAL | 2 refills | Status: DC
  Filled 2023-12-22: qty 30, 30d supply, fill #0

## 2023-12-22 MED ORDER — ASPIRIN 81 MG PO TBEC
81.0000 mg | DELAYED_RELEASE_TABLET | Freq: Every day | ORAL | 12 refills | Status: DC
  Filled 2023-12-22: qty 30, 30d supply, fill #0

## 2023-12-22 MED ORDER — LORAZEPAM 2 MG/ML IJ SOLN
1.0000 mg | INTRAMUSCULAR | Status: DC | PRN
  Filled 2023-12-22: qty 1

## 2023-12-22 MED ORDER — LORAZEPAM 1 MG PO TABS: 1.0000 mg | ORAL_TABLET | ORAL | Status: DC | PRN

## 2023-12-22 MED ORDER — INSULIN ASPART 100 UNIT/ML IJ SOLN
0.0000 [IU] | Freq: Three times a day (TID) | INTRAMUSCULAR | Status: DC
  Administered 2023-12-22: 5 [IU] via SUBCUTANEOUS
  Filled 2023-12-22: qty 1

## 2023-12-22 MED ORDER — CLOPIDOGREL BISULFATE 75 MG PO TABS
75.0000 mg | ORAL_TABLET | Freq: Every day | ORAL | 0 refills | Status: DC
  Filled 2023-12-22: qty 21, 21d supply, fill #0

## 2023-12-22 MED ORDER — ADULT MULTIVITAMIN W/MINERALS CH
1.0000 | ORAL_TABLET | Freq: Every day | ORAL | Status: DC
Start: 2023-12-22 — End: 2023-12-22
  Administered 2023-12-22: 1 via ORAL
  Filled 2023-12-22: qty 1

## 2023-12-22 MED ORDER — THIAMINE HCL 100 MG/ML IJ SOLN: 100.0000 mg | Freq: Every day | INTRAMUSCULAR | Status: DC

## 2023-12-22 MED ORDER — ADULT MULTIVITAMIN W/MINERALS CH
1.0000 | ORAL_TABLET | Freq: Every day | ORAL | 1 refills | Status: AC
  Filled 2023-12-22: qty 90, 90d supply, fill #0

## 2023-12-22 MED ORDER — FOLIC ACID 1 MG PO TABS
1.0000 mg | ORAL_TABLET | Freq: Every day | ORAL | Status: DC
Start: 2023-12-22 — End: 2023-12-22
  Administered 2023-12-22: 1 mg via ORAL
  Filled 2023-12-22: qty 1

## 2023-12-22 MED ORDER — FOLIC ACID 1 MG PO TABS
1.0000 mg | ORAL_TABLET | Freq: Every day | ORAL | 1 refills | Status: DC
  Filled 2023-12-22: qty 90, 90d supply, fill #0

## 2023-12-22 NOTE — Progress Notes (Signed)
 Pt arrives on unit at 0020 this morning admission intake partially completed as pt refused some part of admission intake questions. At around 01:05 writer smelled weed scent coming from pt room, upon entering pt was found in bathroom with lighter sitting on the commode but denies smoking. Other staff members smelled and heard pt yelling at clinical research associate. Press photographer and night shift nurse coordinator notified and security called to search pt belongings. The following item were found. Cigarettes, lighter and weed. The weed was dispose at the dirty room with security personnel present. Pt was educated on hospital policies regarding substance use. Pt currently calm and resting in bed.

## 2023-12-22 NOTE — Hospital Course (Addendum)
 Partly taken from H&P.  Richard Estes is a 63 y.o. African-American male with medical history significant for type 2 diabetes mellitus alcohol use, and polysubstance abuse, who presented to the emergency room with acute onset of feeling weak and starting to have numbness in the left hand around 6 PM and to less extent in the left leg with left hand weakness.   On presentation stable vitals except mildly elevated blood pressure at 147/100, labs with hypokalemia 3.2, AST 122, ALT 62.  Alcohol level was 48, UDS positive for cocaine and cannabinoid. CT head was negative for any acute intracranial abnormality..  Teleneurology was consulted and patient was given aspirin and Plavix loading doses.  Admitted for further workup.  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  CTA head and neck with no LVO.  Severe bilateral vertebral artery origin stenosis.  Also noted to have about 8 mm right parotid lesion, suspicious for primary parotid neoplasm with recommendation to follow-up with ENT as outpatient.  CBG elevated, A1c of 8.0, improved from 14.70-month ago, lipid panel normal with LDL of 55 and HDL of 28, echocardiogram with normal EF, grade 1 diastolic dysfunction and negative bubble study.  No other significant abnormality, mildly dilated aortic root.  PT with no follow-up recommendations.  All of his symptoms has been resolved and he is at baseline.  Neurology is suggesting DAPT for 21 days followed by aspirin and statin. Referral for outpatient neurology was provided.   Patient does has small vessel disease and need to stop using cocaine as it can make him high risk for further strokes and heart attacks.  He was counseled but does not seem like he was interested at this time.  Will need continuation of counseling by PCP.  Patient was also provided a referral to see outpatient ENT for further evaluation of his primary parotid gland lesion.  He was  started on low-dose losartan due to significantly elevated blood pressure and need to close follow-up with PCP for dose titration.  Patient will continue on current medications, need to control his risk which include uncontrolled diabetes, hypertension, smoking, significant alcohol and illicit drug use.

## 2023-12-22 NOTE — Assessment & Plan Note (Signed)
-   We will need to rule out acute CVA. - The patient will be admitted to an observation medically monitored bed.   - We will follow neuro checks q.4 hours for 24 hours.   - The patient will be placed on aspirin.   - Will obtain a brain MRI without contrast as as well as head and neck CTA and 2D echo with bubble study .   - A neurology consultation  as well as physical/occupation/speech therapy consults will be obtained in a.m..  I notified Dr. Schoof about the patient. - The patient will be placed on statin therapy and fasting lipids will be checked.

## 2023-12-22 NOTE — Discharge Instructions (Signed)
 You have been also given a referral to see an ENT as we discussed about parotid gland lesion.

## 2023-12-22 NOTE — Assessment & Plan Note (Signed)
-   The patient will be placed on supplement coverage with NovoLog . - Will hold off metformin . - Will continue glipizide .

## 2023-12-22 NOTE — Discharge Summary (Signed)
 Physician Discharge Summary   Richard: Richard Estes MRN: [id number] DOB: 25-Nov-1960  Admit date:     12/21/2023  Discharge date: 12/22/23  Discharge Physician: Amaryllis Dare   PCP: Leavy Mole, PA-C   Recommendations at discharge:  Please obtain CBC and BMP on follow-up Please continue counseling for risk management and illicit drug abuse. Follow-up with primary care provider Follow-up with neurology Follow-up with ENT for incidental discovery of a parotid gland lesion.  Discharge Diagnoses: Principal Problem:   TIA (transient ischemic attack) Active Problems:   Type 2 diabetes mellitus without complications (HCC)   Alcoholic intoxication   Polysubstance abuse (HCC)   Paresthesia   Lesion of parotid gland   Cerebrovascular accident (CVA) (HCC)   Left arm weakness   Hospital Course: Partly taken from H&P.  Richard Estes is a 63 y.o. African-American male with medical history significant for type 2 diabetes mellitus alcohol use, and polysubstance abuse, who presented to the emergency room with acute onset of feeling weak and starting to have numbness in the left hand around 6 PM and to less extent in the left leg with left hand weakness.   On presentation stable vitals except mildly elevated blood pressure at 147/100, labs with hypokalemia 3.2, AST 122, ALT 62.  Alcohol level was 48, UDS positive for cocaine and cannabinoid. CT head was negative for any acute intracranial abnormality..  Teleneurology was consulted and Richard was given aspirin and Plavix loading doses.  Admitted for further workup.  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  CTA head and neck with no LVO.  Severe bilateral vertebral artery origin stenosis.  Also noted to have about 8 mm right parotid lesion, suspicious for primary parotid neoplasm with recommendation to follow-up with ENT as outpatient.  CBG elevated, A1c of 8.0, improved from  14.47-month ago, lipid panel normal with LDL of 55 and HDL of 28, echocardiogram with normal EF, grade 1 diastolic dysfunction and negative bubble study.  No other significant abnormality, mildly dilated aortic root.  PT with no follow-up recommendations.  All of his symptoms has been resolved and he is at baseline.  Neurology is suggesting DAPT for 21 days followed by aspirin and statin. Referral for outpatient neurology was provided.   Richard does has small vessel disease and need to stop using cocaine as it can make him high risk for further strokes and heart attacks.  He was counseled but does not seem like he was interested at this time.  Will need continuation of counseling by PCP.  Richard was also provided a referral to see outpatient ENT for further evaluation of his primary parotid gland lesion.  He was started on low-dose losartan due to significantly elevated blood pressure and need to close follow-up with PCP for dose titration.  Richard will continue on current medications, need to control his risk which include uncontrolled diabetes, hypertension, smoking, significant alcohol and illicit drug use.   Consultants: Neurology Procedures performed: None Disposition: Home Diet recommendation:  Discharge Diet Orders (From admission, onward)     Start     Ordered   12/22/23 0000  Diet - low sodium heart healthy        12/22/23 1314           Cardiac and Carb modified diet DISCHARGE MEDICATION: Allergies as of 12/22/2023       Reactions   Other Anaphylaxis   Bee Venom  Medication List     TAKE these medications    albuterol  108 (90 Base) MCG/ACT inhaler Commonly known as: VENTOLIN  HFA Inhale 2 puffs into the lungs every 6 (six) hours as needed for shortness of breath or wheezing.   aspirin EC 81 MG tablet Take 1 tablet (81 mg total) by mouth daily. Swallow whole. Start taking on: December 23, 2023   clopidogrel 75 MG tablet Commonly known as:  PLAVIX Take 1 tablet (75 mg total) by mouth daily for 21 days. Start taking on: December 23, 2023   EPINEPHrine  0.3 mg/0.3 mL Soaj injection Commonly known as: EPI-PEN Inject 0.3 mg into the muscle as needed for anaphylaxis.   folic acid  1 MG tablet Commonly known as: FOLVITE  Take 1 tablet (1 mg total) by mouth daily. Start taking on: December 23, 2023   glipiZIDE  10 MG tablet Commonly known as: GLUCOTROL  Take 1 tablet (10 mg total) by mouth daily before breakfast. What changed: Another medication with the same name was removed. Continue taking this medication, and follow the directions you see here.   losartan 25 MG tablet Commonly known as: COZAAR Take 1 tablet (25 mg total) by mouth daily.   metFORMIN  1000 MG tablet Commonly known as: GLUCOPHAGE  Take 1 tablet (1,000 mg total) by mouth 2 (two) times daily with a meal. What changed: Another medication with the same name was removed. Continue taking this medication, and follow the directions you see here.   multivitamin with minerals Tabs tablet Take 1 tablet by mouth daily. Start taking on: December 23, 2023   rosuvastatin 20 MG tablet Commonly known as: Crestor Take 1 tablet (20 mg total) by mouth daily.   thiamine  100 MG tablet Commonly known as: Vitamin B-1 Take 1 tablet (100 mg total) by mouth daily. Start taking on: December 23, 2023   True Metrix Blood Glucose Test test strip Generic drug: glucose blood Use 3 (three) times daily. Use as directed to check blood sugar. What changed: Another medication with the same name was removed. Continue taking this medication, and follow the directions you see here.   True Metrix Meter w/Device Kit Use 3 (three) times daily to check blood sugar What changed: Another medication with the same name was removed. Continue taking this medication, and follow the directions you see here.   TRUEdraw Lancing Device Misc Use 3 (three) times daily to check blood sugar   TRUEplus Lancets  33G Misc Use as directed to check blood sugar up 4 (four) times daily.   TRUEplus Lancets 33G Misc Use 3 (three) times daily. Use as directed to check blood sugar.        Follow-up Information     Leavy Mole, PA-C. Schedule an appointment as soon as possible for a visit in 1 week(s).   Specialty: Family Medicine Contact information: [address] [place] [address] [phone]         Milissa Hamming, MD. Schedule an appointment as soon as possible for a visit.   Specialty: Otolaryngology Contact information: [address] [place] [address] [phone]                Discharge Exam: Richard Estes   12/22/23 0026  Weight: 60.9 kg   General.  Malnourished gentleman, in no acute distress. Pulmonary.  Lungs clear bilaterally, normal respiratory effort. CV.  Regular rate and rhythm, no JVD, rub or murmur. Abdomen.  Soft, nontender, nondistended, BS positive. CNS.  Alert and oriented .  No focal neurologic deficit. Extremities.  No edema, no cyanosis, pulses intact and symmetrical.  Condition at discharge: stable  The results of significant diagnostics from this hospitalization (including imaging, microbiology, ancillary and laboratory) are listed below for reference.   Imaging Studies: ECHOCARDIOGRAM COMPLETE Result Date: 12/22/2023    ECHOCARDIOGRAM REPORT   Richard Name:   Richard Estes Date of Exam: 12/22/2023 Medical Rec #:  [id number]        Height:       69.0 in Accession #:    [id number]       Weight:       137.0 lb Date of Birth:  May 28, 1960        BSA:          1.759 m Richard Age:    63 years         BP:           139/105 mmHg Richard Gender: M                HR:           93 bpm. Exam Location:  [facility] Procedure: 2D Echo, Cardiac Doppler, Color Doppler and Saline Contrast Bubble            Study (Both Spectral and Color Flow Doppler were utilized during            procedure). Indications:     Stroke I63.9   History:         Richard has no prior history of Echocardiogram examinations.                  Stroke.  Sonographer:     Ashley McNeely-Sloane Referring Phys:  [id number] JAN A MANSY Diagnosing Phys: Lonni Hanson MD IMPRESSIONS  1. Left ventricular ejection fraction, by estimation, is 60 to 65%. The left ventricle has normal function. The left ventricle has no regional wall motion abnormalities. Left ventricular diastolic parameters are consistent with Grade I diastolic dysfunction (impaired relaxation).  2. Right ventricular systolic function is normal. The right ventricular size is normal.  3. The mitral valve is normal in structure. Mild mitral valve regurgitation. No evidence of mitral stenosis.  4. The aortic valve is tricuspid. Aortic valve regurgitation is mild. No aortic stenosis is present.  5. Aortic dilatation noted. There is mild dilatation of the aortic root, measuring 42 mm.  6. The inferior vena cava is normal in size with greater than 50% respiratory variability, suggesting right atrial pressure of 3 mmHg.  7. Agitated saline contrast bubble study was negative, with no evidence of any interatrial shunt. FINDINGS  Left Ventricle: Left ventricular ejection fraction, by estimation, is 60 to 65%. The left ventricle has normal function. The left ventricle has no regional wall motion abnormalities. The left ventricular internal cavity size was normal in size. There is  no left ventricular hypertrophy. Left ventricular diastolic parameters are consistent with Grade I diastolic dysfunction (impaired relaxation). Right Ventricle: The right ventricular size is normal. No increase in right ventricular wall thickness. Right ventricular systolic function is normal. Left Atrium: Left atrial size was normal in size. Right Atrium: Right atrial size was normal in size. Pericardium: There is no evidence of pericardial effusion. Mitral Valve: The mitral valve is normal in structure. Mild mitral annular calcification.  Mild mitral valve regurgitation. No evidence of mitral valve stenosis. MV peak gradient, 2.8 mmHg. The mean mitral valve gradient is 1.0 mmHg. Tricuspid Valve: The tricuspid valve is normal in structure.  Tricuspid valve regurgitation is trivial. Aortic Valve: The aortic valve is tricuspid. Aortic valve regurgitation is mild. No aortic stenosis is present. Aortic valve mean gradient measures 2.0 mmHg. Aortic valve peak gradient measures 3.7 mmHg. Aortic valve area, by VTI measures 3.17 cm. Pulmonic Valve: The pulmonic valve was not well visualized. Pulmonic valve regurgitation is not visualized. No evidence of pulmonic stenosis. Aorta: Aortic dilatation noted. There is mild dilatation of the aortic root, measuring 42 mm. Pulmonary Artery: The pulmonary artery is not well seen. Venous: The inferior vena cava is normal in size with greater than 50% respiratory variability, suggesting right atrial pressure of 3 mmHg. IAS/Shunts: The interatrial septum was not well visualized. Agitated saline contrast was given intravenously to evaluate for intracardiac shunting. Agitated saline contrast bubble study was negative, with no evidence of any interatrial shunt.  LEFT VENTRICLE PLAX 2D LVIDd:         4.16 cm     Diastology LVIDs:         2.40 cm     LV e' medial:    4.74 cm/s LV PW:         1.01 cm     LV E/e' medial:  9.9 LV IVS:        0.96 cm     LV e' lateral:   8.03 cm/s LVOT diam:     2.20 cm     LV E/e' lateral: 5.8 LV SV:         60 LV SV Index:   34 LVOT Area:     3.80 cm  LV Volumes (MOD) LV vol d, MOD A2C: 58.8 ml LV vol d, MOD A4C: 56.4 ml LV vol s, MOD A2C: 22.7 ml LV vol s, MOD A4C: 20.1 ml LV SV MOD A2C:     36.1 ml LV SV MOD A4C:     56.4 ml LV SV MOD BP:      35.2 ml RIGHT VENTRICLE             IVC RV Basal diam:  3.79 cm     IVC diam: 1.51 cm RV Mid diam:    2.37 cm RV S prime:     12.90 cm/s TAPSE (M-mode): 2.7 cm LEFT ATRIUM             Index        RIGHT ATRIUM           Index LA diam:        2.60 cm  1.48 cm/m   RA Area:     10.90 cm LA Vol (A2C):   50.6 ml 28.76 ml/m  RA Volume:   21.30 ml  12.11 ml/m LA Vol (A4C):   40.3 ml 22.91 ml/m LA Biplane Vol: 45.8 ml 26.04 ml/m  AORTIC VALVE                    PULMONIC VALVE AV Area (Vmax):    3.36 cm     PV Vmax:        0.94 m/s AV Area (Vmean):   2.99 cm     PV Vmean:       62.700 cm/s AV Area (VTI):     3.17 cm     PV VTI:         0.134 m AV Vmax:           96.10 cm/s   PV Peak grad:   3.5 mmHg AV Vmean:  73.100 cm/s  PV Mean grad:   2.0 mmHg AV VTI:            0.188 m      RVOT Peak grad: 3 mmHg AV Peak Grad:      3.7 mmHg AV Mean Grad:      2.0 mmHg LVOT Vmax:         85.00 cm/s LVOT Vmean:        57.500 cm/s LVOT VTI:          0.157 m LVOT/AV VTI ratio: 0.84  AORTA Ao Root diam: 4.20 cm MITRAL VALVE MV Area (PHT): 4.12 cm    SHUNTS MV Area VTI:   4.32 cm    Systemic VTI:  0.16 m MV Peak grad:  2.8 mmHg    Systemic Diam: 2.20 cm MV Mean grad:  1.0 mmHg    Pulmonic VTI:  0.133 m MV Vmax:       0.84 m/s MV Vmean:      43.8 cm/s MV Decel Time: 184 msec MV E velocity: 46.80 cm/s MV A velocity: 59.50 cm/s MV E/A ratio:  0.79 Lonni End MD Electronically signed by Lonni Hanson MD Signature Date/Time: 12/22/2023/11:56:05 AM    Final    CT ANGIO HEAD NECK W WO CM Result Date: 12/22/2023 EXAM: CTA HEAD AND NECK WITHOUT AND WITH 12/22/2023 01:46:06 AM TECHNIQUE: CTA of the head and neck was performed without and with the administration of intravenous contrast. Multiplanar 2D and/or 3D reformatted images are provided for review. Automated exposure control, iterative reconstruction, and/or weight based adjustment of the mA/kV was utilized to reduce the radiation dose to as low as reasonably achievable. Stenosis of the internal carotid arteries measured using NASCET criteria. COMPARISON: None available CLINICAL HISTORY: Neuro deficit, acute, stroke suspected FINDINGS: AORTIC ARCH AND ARCH VESSELS: No dissection or arterial injury. No significant  stenosis of the brachiocephalic or subclavian arteries. CERVICAL CAROTID ARTERIES: No dissection, arterial injury, or hemodynamically significant stenosis by NASCET criteria. CERVICAL VERTEBRAL ARTERIES: Severe bilateral vertebral artery origin stenosis. LUNGS AND MEDIASTINUM: Unremarkable. SOFT TISSUES: Approximately 8 mm right parotid lesion. BONES: No acute abnormality. ANTERIOR CIRCULATION: No significant stenosis of the internal carotid arteries. No significant stenosis of the anterior cerebral arteries. No significant stenosis of the middle cerebral arteries. No aneurysm. POSTERIOR CIRCULATION: No significant stenosis of the posterior cerebral arteries. No significant stenosis of the basilar artery. No significant stenosis of the vertebral arteries. No aneurysm. OTHER: No dural venous sinus thrombosis on this non-dedicated study. IMPRESSION: 1. No large vessel occlusion. 2. Severe bilateral vertebral artery origin stenosis. 3. Approximately 8 mm right parotid lesion, suspicious for primary parotid neoplasm. Recommend outpatient ENT consultation. Electronically signed by: Gilmore Molt MD 12/22/2023 02:26 AM EST RP Workstation: [id number]   MR BRAIN WO CONTRAST Result Date: 12/22/2023 EXAM: MRI BRAIN WITHOUT CONTRAST 12/22/2023 01:46:21 AM TECHNIQUE: Multiplanar multisequence MRI of the head/brain was performed without the administration of intravenous contrast. COMPARISON: CT head 12/21/2023 CLINICAL HISTORY: Neuro deficit, acute, stroke suspected FINDINGS: BRAIN AND VENTRICLES: Small acute infarct in the right thalamus. No intracranial hemorrhage. No mass. No midline shift. No hydrocephalus. Cerebral atrophy. Normal flow voids. Mild to moderate T2 hyperintensities in the white matter, compatible with chronic microvascular ischemic change. ORBITS: No acute abnormality. SINUSES AND MASTOIDS: No acute abnormality. BONES AND SOFT TISSUES: Normal marrow signal. IMPRESSION: 1. Small acute infarct in the right  thalamus. Electronically signed by: Gilmore Molt MD 12/22/2023 02:14 AM EST RP Workstation: [id number]   CT  HEAD CODE STROKE WO CONTRAST (LKW 0-4.5h, LVO 0-24h) Result Date: 12/21/2023 EXAM: CT HEAD WITHOUT CONTRAST 12/21/2023 09:18:21 PM TECHNIQUE: CT of the head was performed without the administration of intravenous contrast. Automated exposure control, iterative reconstruction, and/or weight based adjustment of the mA/kV was utilized to reduce the radiation dose to as low as reasonably achievable. COMPARISON: CT head 10/28/2022 CLINICAL HISTORY: Neuro deficit, acute, stroke suspected. FINDINGS: BRAIN AND VENTRICLES: No acute hemorrhage. No evidence of acute infarct. Remote right basal ganglia lacunar infarct. No hydrocephalus. No extra-axial collection. No mass effect or midline shift. ORBITS: No acute abnormality. SINUSES: No acute abnormality. SOFT TISSUES AND SKULL: No skull fracture. Findings discussed with Dr. Dicky via telephone at 9:33 PM. IMPRESSION: 1. No evidence of acute intracranial abnormality. Electronically signed by: Gilmore Molt MD 12/21/2023 09:33 PM EST RP Workstation: [id number]    Microbiology: Results for orders placed or performed during the hospital encounter of 03/18/23  Resp panel by RT-PCR (RSV, Flu A&B, Covid) Anterior Nasal Swab     Status: None   Collection Time: 03/18/23  7:14 PM   Specimen: Anterior Nasal Swab  Result Value Ref Range Status   SARS Coronavirus 2 by RT PCR NEGATIVE NEGATIVE Final    Comment: (NOTE) SARS-CoV-2 target nucleic acids are NOT DETECTED.  The SARS-CoV-2 RNA is generally detectable in upper respiratory specimens during the acute phase of infection. The lowest concentration of SARS-CoV-2 viral copies this assay can detect is 138 copies/mL. A negative result does not preclude SARS-Cov-2 infection and should not be used as the sole basis for treatment or other Richard management decisions. A negative result may occur with  improper  specimen collection/handling, submission of specimen other than nasopharyngeal swab, presence of viral mutation(s) within the areas targeted by this assay, and inadequate number of viral copies(<138 copies/mL). A negative result must be combined with clinical observations, Richard history, and epidemiological information. The expected result is Negative.  Fact Sheet for Patients:  bloggercourse.com  Fact Sheet for Healthcare Providers:  seriousbroker.it  This test is no t yet approved or cleared by the [place]  FDA and  has been authorized for detection and/or diagnosis of SARS-CoV-2 by FDA under an Emergency Use Authorization (EUA). This EUA will remain  in effect (meaning this test can be used) for the duration of the COVID-19 declaration under Section 564(b)(1) of the Act, 21 U.S.C.section 360bbb-3(b)(1), unless the authorization is terminated  or revoked sooner.       Influenza A by PCR NEGATIVE NEGATIVE Final   Influenza B by PCR NEGATIVE NEGATIVE Final    Comment: (NOTE) The Xpert Xpress SARS-CoV-2/FLU/RSV plus assay is intended as an aid in the diagnosis of influenza from Nasopharyngeal swab specimens and should not be used as a sole basis for treatment. Nasal washings and aspirates are unacceptable for Xpert Xpress SARS-CoV-2/FLU/RSV testing.  Fact Sheet for Patients: bloggercourse.com  Fact Sheet for Healthcare Providers: seriousbroker.it  This test is not yet approved or cleared by the [place]  FDA and has been authorized for detection and/or diagnosis of SARS-CoV-2 by FDA under an Emergency Use Authorization (EUA). This EUA will remain in effect (meaning this test can be used) for the duration of the COVID-19 declaration under Section 564(b)(1) of the Act, 21 U.S.C. section 360bbb-3(b)(1), unless the authorization is terminated or revoked.     Resp  Syncytial Virus by PCR NEGATIVE NEGATIVE Final    Comment: (NOTE) Fact Sheet for Patients: bloggercourse.com  Fact Sheet for Healthcare Providers: seriousbroker.it  This  test is not yet approved or cleared by the [place]  FDA and has been authorized for detection and/or diagnosis of SARS-CoV-2 by FDA under an Emergency Use Authorization (EUA). This EUA will remain in effect (meaning this test can be used) for the duration of the COVID-19 declaration under Section 564(b)(1) of the Act, 21 U.S.C. section 360bbb-3(b)(1), unless the authorization is terminated or revoked.  Performed at [facility], [address] [place], [place] [address]     Labs: CBC: Recent Labs  Lab 12/21/23 2139 12/22/23 0539  WBC 5.3 5.4  NEUTROABS 2.3  --   HGB 11.6* 11.8*  HCT 35.6* 35.8*  MCV 89.7 88.6  PLT 223 262   Basic Metabolic Panel: Recent Labs  Lab 12/21/23 2322 12/22/23 0539  NA 136 134*  K 3.2* 3.5  CL 100 101  CO2 24 24  GLUCOSE 148* 256*  BUN 9 12  CREATININE 0.60* 0.72  CALCIUM 8.7* 8.7*   Liver Function Tests: Recent Labs  Lab 12/21/23 2322  AST 122*  ALT 62*  ALKPHOS 61  BILITOT 0.5  PROT 7.8  ALBUMIN 3.4*   CBG: Recent Labs  Lab 12/21/23 2330 12/22/23 0055 12/22/23 0828 12/22/23 1202  GLUCAP 155* 183* 260* 247*    Discharge time spent: greater than 30 minutes.  This record has been created using Conservation officer, historic buildings. Errors have been sought and corrected,but may not always be located. Such creation errors do not reflect on the standard of care.   Signed: Amaryllis Dare, MD [facility] 12/22/2023

## 2023-12-22 NOTE — Assessment & Plan Note (Signed)
-   This includes cocaine and marijuana in addition to alcohol. - He was counseled for cessation.

## 2023-12-22 NOTE — Progress Notes (Addendum)
 PT Cancellation Note  Patient Details Name: Richard Estes MRN: [id number] DOB: 1960/08/01   Cancelled Treatment:    Reason Eval/Treat Not Completed: PT screened, no needs identified, will sign off (Met with pt bedside- Pt asures author his basic mobility it quite good, he has not need for close evaluation of any of this.) Pt reports having been up, AMB in room this morning, things were great. Pt requests to prioritize rest at this time. All need met. Will sign off.   Divit Stipp C 12/22/2023, 9:55 AM  9:56 AM, 12/22/23 Peggye JAYSON Linear, PT, DPT Physical Therapist - [facility]  [phone] [facility])

## 2023-12-22 NOTE — Progress Notes (Signed)
 SLP Cancellation Note  Patient Details Name: Richard Estes MRN: [id number] DOB: 11-08-1960   Cancelled treatment:       Reason Eval/Treat Not Completed: SLP screened, no needs identified, will sign off (chart reviewed; consulted NSG and met w/ pt in room.)  Pt is a 63 y.o. African-American male with medical history significant for type 2 diabetes mellitus alcohol use, and polysubstance abuse, who presented to the emergency room with acute onset of feeling weak and starting to have numbness in the left hand around 6 PM and to less extent in the left leg with left hand weakness. No report of other weakness and no dysphagia/dysarthria per MD note.  Pt denied any difficulty swallowing and is currently on a regular diet; tolerates swallowing pills w/ water per NSG. Pt conversed in conversation w/out overt expressive/receptive deficits noted; pt denied any speech-language deficits. Speech clear, intelligible.  No further skilled ST services indicated as pt appears at his baseline. Pt agreed. NSG to reconsult if any change in status while admitted.     [name], MS, CCC-SLP Speech Language Pathologist Rehab Services; [facility] [phone] (ascom) [name] 12/22/2023, 9:39 AM

## 2023-12-22 NOTE — Plan of Care (Signed)
  Problem: Coping: Goal: Ability to adjust to condition or change in health will improve Outcome: Progressing   Problem: Metabolic: Goal: Ability to maintain appropriate glucose levels will improve Outcome: Progressing   Problem: Coping: Goal: Will identify appropriate support needs Outcome: Progressing   Problem: Self-Care: Goal: Ability to participate in self-care as condition permits will improve Outcome: Progressing   Problem: Clinical Measurements: Goal: Will remain free from infection Outcome: Progressing

## 2023-12-22 NOTE — Plan of Care (Signed)
 Plan of care  Patient admitted with small thalamic stroke and his stroke workup is completed. Etiology of stroke is small vessel disease. He should be discharged on - ASA 81mg  daily + plavix 75mg  daily x21 days f/b ASA 81mg  daily monotherapy after that. Continue home cholesterol regimen. I will refer to outpatient neurology for f/u.   Richard Ross, MD [facility] [phone]  If 7pm- 7am, please page neurology on call as listed in AMION.

## 2023-12-22 NOTE — Assessment & Plan Note (Signed)
-  The patient will be placed on CIWA protocol while he is here.

## 2023-12-22 NOTE — Progress Notes (Signed)
 OT Cancellation Note  Patient Details Name: Richard Estes MRN: [id number] DOB: 06/06/1960   Cancelled Treatment:    Reason Eval/Treat Not Completed: OT screened, no needs identified, will sign off. Pt received in bed, politely declining participation in OT evaluation. Pt states he has no lasting deficits in LUE and is back to his functional baseline. Has been completing ADLs independently in room. OT will complete orders, thank you for this referral.     Delon L. Jadie Allington, OTR/L  12/22/23, 1:01 PM

## 2023-12-23 NOTE — Congregational Nurse Program (Signed)
  Dept: [phone]   Congregational Nurse Program Note  Date of Encounter: 12/23/2023 Client to [facility] with his discharge medications. RN reviewed the medications as requested by client. Client did not have time to stay long enough for RN to assist in making follow up appointments as was recommended in his AVS.  RN was minimally able to advise client what each medication was for. MARLA Marina BSN, RN Past Medical History: Past Medical History:  Diagnosis Date   Alcohol abuse    Diabetes mellitus without complication (HCC)     Encounter Details:

## 2023-12-24 NOTE — Congregational Nurse Program (Signed)
  Dept: [phone]   Congregational Nurse Program Note  Date of Encounter: 12/24/2023 Client to [facility] with request for blood glucose check. Post prandial blood sugar 210. Client reports he is taking his Metformin  and glipizide  as well as he other prescribed medications. Client was discharged for [facility] on 11/4 and requested assistance in making a follow up appointment with his PCP. Rn contacted [facility] and appointment was given for 11/10 at 2 pm with Mliss Spray FNP. RN arranged Medicaid transportation through [employer] for client.  BSN, RN Past Medical History: Past Medical History:  Diagnosis Date   Alcohol abuse    Diabetes mellitus without complication [facility])     Encounter Details:  Community Questionnaire - 12/24/23 1230       Questionnaire   Ask client: Do you give verbal consent for me to treat you today? Yes    Student Assistance N/A    Location Patient Served  [facility]    Encounter Setting CN site    Population Status Unknown   curently staying with his son   Insurance Medicaid   BS/BC through the exchange   Insurance/Financial Assistance Referral N/A   RN to assist with Medicaid application.   Medication Have Medication Insecurities    Medical Provider [facility]   Screening Referrals Made N/A    Medical Referrals Made [facility]   [facility] call placed for apt   Medical Appointment Completed N/A    CNP Interventions Educate;Advocate/Support;Navigate Healthcare System;Case Management    Screenings CN Performed Blood Glucose    ED Visit Averted N/A    Life-Saving Intervention Made N/A

## 2023-12-28 ENCOUNTER — Inpatient Hospital Stay: Payer: MEDICAID | Admitting: Nurse Practitioner

## 2024-01-07 ENCOUNTER — Emergency Department: Payer: MEDICAID

## 2024-01-07 ENCOUNTER — Emergency Department
Admission: EM | Admit: 2024-01-07 | Discharge: 2024-01-07 | Disposition: A | Discharge status: Home / Self Care | Payer: MEDICAID | Attending: Emergency Medicine | Admitting: Emergency Medicine

## 2024-01-07 ENCOUNTER — Other Ambulatory Visit: Payer: Self-pay

## 2024-01-07 DIAGNOSIS — Z8673 Personal history of transient ischemic attack (TIA), and cerebral infarction without residual deficits: Secondary | ICD-10-CM | POA: Diagnosis not present

## 2024-01-07 DIAGNOSIS — R531 Weakness: Secondary | ICD-10-CM

## 2024-01-07 DIAGNOSIS — Z91199 Patient's noncompliance with other medical treatment and regimen due to unspecified reason: Secondary | ICD-10-CM

## 2024-01-07 DIAGNOSIS — R200 Anesthesia of skin: Secondary | ICD-10-CM | POA: Insufficient documentation

## 2024-01-07 DIAGNOSIS — E1165 Type 2 diabetes mellitus with hyperglycemia: Secondary | ICD-10-CM | POA: Diagnosis not present

## 2024-01-07 DIAGNOSIS — E119 Type 2 diabetes mellitus without complications: Secondary | ICD-10-CM

## 2024-01-07 DIAGNOSIS — F1910 Other psychoactive substance abuse, uncomplicated: Secondary | ICD-10-CM

## 2024-01-07 DIAGNOSIS — E860 Dehydration: Secondary | ICD-10-CM

## 2024-01-07 DIAGNOSIS — R739 Hyperglycemia, unspecified: Secondary | ICD-10-CM | POA: Diagnosis present

## 2024-01-07 LAB — CBC WITH DIFFERENTIAL/PLATELET
Abs Immature Granulocytes: 0.01 K/uL (ref 0.00–0.07)
Basophils Absolute: 0 K/uL (ref 0.0–0.1)
Basophils Relative: 0 %
Eosinophils Absolute: 0.1 K/uL (ref 0.0–0.5)
Eosinophils Relative: 2 %
HCT: 35.4 % — ABNORMAL LOW (ref 39.0–52.0)
Hemoglobin: 11.7 g/dL — ABNORMAL LOW (ref 13.0–17.0)
Immature Granulocytes: 0 %
Lymphocytes Relative: 47 %
Lymphs Abs: 2.2 K/uL (ref 0.7–4.0)
MCH: 29.2 pg (ref 26.0–34.0)
MCHC: 33.1 g/dL (ref 30.0–36.0)
MCV: 88.3 fL (ref 80.0–100.0)
Monocytes Absolute: 0.4 K/uL (ref 0.1–1.0)
Monocytes Relative: 9 %
Neutro Abs: 2 K/uL (ref 1.7–7.7)
Neutrophils Relative %: 42 %
Platelets: 213 K/uL (ref 150–400)
RBC: 4.01 MIL/uL — ABNORMAL LOW (ref 4.22–5.81)
RDW: 12.8 % (ref 11.5–15.5)
Smear Review: NORMAL
WBC: 4.7 K/uL (ref 4.0–10.5)
nRBC: 0 % (ref 0.0–0.2)

## 2024-01-07 LAB — GLUCOSE, POCT (MANUAL RESULT ENTRY): POC Glucose: 275 mg/dL — AB (ref 70–99)

## 2024-01-07 LAB — COMPREHENSIVE METABOLIC PANEL WITH GFR
ALT: 107 U/L — ABNORMAL HIGH (ref 0–44)
AST: 172 U/L — ABNORMAL HIGH (ref 15–41)
Albumin: 4 g/dL (ref 3.5–5.0)
Alkaline Phosphatase: 86 U/L (ref 38–126)
Anion gap: 12 (ref 5–15)
BUN: 6 mg/dL — ABNORMAL LOW (ref 8–23)
CO2: 28 mmol/L (ref 22–32)
Calcium: 9.1 mg/dL (ref 8.9–10.3)
Chloride: 98 mmol/L (ref 98–111)
Creatinine, Ser: 0.77 mg/dL (ref 0.61–1.24)
GFR, Estimated: >60 mL/min (ref >60)
Glucose, Bld: 219 mg/dL — ABNORMAL HIGH (ref 70–99)
Potassium: 3.4 mmol/L — ABNORMAL LOW (ref 3.5–5.1)
Sodium: 137 mmol/L (ref 135–145)
Total Bilirubin: 0.6 mg/dL (ref 0.0–1.2)
Total Protein: 7.8 g/dL (ref 6.5–8.1)

## 2024-01-07 LAB — CBG MONITORING, ED: Glucose-Capillary: 255 mg/dL — ABNORMAL HIGH (ref 70–99)

## 2024-01-07 LAB — URINALYSIS, ROUTINE W REFLEX MICROSCOPIC
Bilirubin Urine: NEGATIVE
Glucose, UA: >=500 mg/dL — AB
Hgb urine dipstick: NEGATIVE
Ketones, ur: NEGATIVE mg/dL
Leukocytes,Ua: NEGATIVE
Nitrite: NEGATIVE
Protein, ur: NEGATIVE mg/dL
RBC / HPF: 0 RBC/hpf (ref 0–5)
Specific Gravity, Urine: 1.045 — ABNORMAL HIGH (ref 1.005–1.030)
pH: 6 (ref 5.0–8.0)

## 2024-01-07 LAB — TROPONIN T, HIGH SENSITIVITY
Troponin T High Sensitivity: 17 ng/L (ref 0–19)
Troponin T High Sensitivity: 21 ng/L — ABNORMAL HIGH (ref 0–19)

## 2024-01-07 LAB — APTT: aPTT: 28 s (ref 24–36)

## 2024-01-07 LAB — PROTIME-INR
INR: 1.1 (ref 0.8–1.2)
Prothrombin Time: 14.3 s (ref 11.4–15.2)

## 2024-01-07 LAB — ETHANOL: Alcohol, Ethyl (B): 114 mg/dL — ABNORMAL HIGH (ref <15)

## 2024-01-07 MED ORDER — METFORMIN HCL 1000 MG PO TABS
1000.0000 mg | ORAL_TABLET | Freq: Two times a day (BID) | ORAL | 2 refills | Status: DC
  Filled 2024-01-07: qty 60, 30d supply, fill #0

## 2024-01-07 MED ORDER — ASPIRIN 81 MG PO TBEC
81.0000 mg | DELAYED_RELEASE_TABLET | Freq: Every day | ORAL | 2 refills | Status: AC
  Filled 2024-01-07: qty 60, 60d supply, fill #0

## 2024-01-07 MED ORDER — CLOPIDOGREL BISULFATE 75 MG PO TABS
75.0000 mg | ORAL_TABLET | Freq: Every day | ORAL | 0 refills | Status: AC
  Filled 2024-01-07: qty 7, 7d supply, fill #0

## 2024-01-07 MED ORDER — SODIUM CHLORIDE 0.9% FLUSH: 3.0000 mL | Freq: Once | INTRAVENOUS | Status: DC

## 2024-01-07 MED ORDER — LACTATED RINGERS IV BOLUS
1000.0000 mL | Freq: Once | INTRAVENOUS | Status: AC
  Administered 2024-01-07: 1000 mL via INTRAVENOUS

## 2024-01-07 MED ORDER — LOSARTAN POTASSIUM 25 MG PO TABS
25.0000 mg | ORAL_TABLET | Freq: Every day | ORAL | 2 refills | Status: DC
  Filled 2024-01-07: qty 30, 30d supply, fill #0

## 2024-01-07 MED ORDER — IOHEXOL 350 MG/ML SOLN
100.0000 mL | Freq: Once | INTRAVENOUS | Status: AC | PRN
  Administered 2024-01-07: 100 mL via INTRAVENOUS

## 2024-01-07 MED ORDER — ROSUVASTATIN CALCIUM 20 MG PO TABS
20.0000 mg | ORAL_TABLET | Freq: Every day | ORAL | 2 refills | Status: DC
  Filled 2024-01-07: qty 30, 30d supply, fill #0

## 2024-01-07 MED ORDER — GLIPIZIDE ER 10 MG PO TB24
10.0000 mg | ORAL_TABLET | Freq: Every day | ORAL | 2 refills | Status: DC
  Filled 2024-01-07: qty 30, 30d supply, fill #0

## 2024-01-07 NOTE — ED Notes (Signed)
 Code Stroke [employer] spoke [employer]

## 2024-01-07 NOTE — ED Notes (Signed)
Provided patient with urinal for urine sample

## 2024-01-07 NOTE — ED Notes (Addendum)
 Patient's LKW on 01/06/2024 @ 1600 Patient arrived to ED @ 1303 Code stroke called @ 1332 Telestroke notified @ 1332 Patient arrived to CT @ 1333 Pharmacist to CT @ 1341 Dr. Merrianne to CT @ [phone]

## 2024-01-07 NOTE — Progress Notes (Signed)
  Chaplain On-Call responded to Code Stroke notification at 1337 hours.  The patient was already at the CT Scan area for tests.  Chaplain assured Staff of availability as needed.  Chaplain Bebe Ardean EMERSON Hershal., [facility]

## 2024-01-07 NOTE — ED Notes (Signed)
 Patient in MRI; will draw repeat troponin and encourage patient to provide urine sample when he returns.
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
## 2024-01-07 NOTE — ED Notes (Signed)
 During assessment, patient admitted to daily use of alcohol with his last drink this morning; also admits to marijuana and crack/cocaine use with last using on 01/06/2024 before and after his collapsing episode.
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
## 2024-01-07 NOTE — ED Notes (Addendum)
 Richard Estes

## 2024-01-07 NOTE — ED Provider Notes (Signed)
 Procedures  Clinical Course as of 01/07/24 TRENNA  Thu Jan 07, 2024  1409 I go over to CT and consult with neurology [DS]    Clinical Course User Index [DS] Claudene Rover, MD    ----------------------------------------- 6:22 PM on 01/07/2024 -----------------------------------------  MRI negative.  Patient feeling back to normal, denies any complaints.  States he is hungry and requested turkey sandwich with mayonnaise.  Neuroexam is normal.  IV fluids given for hydration.  Stable for discharge.    Viviann Pastor, MD 01/07/24 TRENNA

## 2024-01-07 NOTE — Progress Notes (Signed)
 CODE STROKE- PHARMACY COMMUNICATION   Time CODE STROKE called/page received:1340  Time response to CODE STROKE was made (in person or via phone): 1341  Time Stroke Kit retrieved from Pyxis (only if needed):N/A  Name of Provider/Nurse contacted: Merrianne Locus  Past Medical History:  Diagnosis Date   Alcohol abuse    Diabetes mellitus without complication (HCC)    Prior to Admission medications   Medication Sig Start Date End Date Taking? Authorizing Provider  aspirin EC 81 MG tablet Take 1 tablet (81 mg total) by mouth daily. Swallow whole. 12/23/23  Yes Caleen Qualia, MD  aspirin EC 81 MG tablet Take 1 tablet (81 mg total) by mouth daily. Swallow whole. 01/07/24 01/06/25 Yes Claudene Rover, MD  clopidogrel (PLAVIX) 75 MG tablet Take 1 tablet (75 mg total) by mouth daily for 7 days. 01/07/24 01/14/24 Yes Claudene Rover, MD  EPINEPHrine  0.3 mg/0.3 mL IJ SOAJ injection Inject 0.3 mg into the muscle as needed for anaphylaxis. 06/18/23  Yes Iloabachie, Chioma E, NP  folic acid  (FOLVITE ) 1 MG tablet Take 1 tablet (1 mg total) by mouth daily. 12/23/23  Yes Caleen Qualia, MD  glipiZIDE  (GLUCOTROL  XL) 10 MG 24 hr tablet Take 1 tablet (10 mg total) by mouth daily with breakfast. 01/07/24  Yes Claudene Rover, MD  glipiZIDE  (GLUCOTROL ) 10 MG tablet Take 1 tablet (10 mg total) by mouth daily before breakfast. 08/19/23  Yes Gordan Huxley, MD  losartan (COZAAR) 25 MG tablet Take 1 tablet (25 mg total) by mouth daily. 12/22/23  Yes Amin, Sumayya, MD  losartan (COZAAR) 25 MG tablet Take 1 tablet (25 mg total) by mouth daily. 01/07/24 01/06/25 Yes Claudene Rover, MD  metFORMIN  (GLUCOPHAGE ) 1000 MG tablet Take 1 tablet (1,000 mg total) by mouth 2 (two) times daily with a meal. 08/19/23  Yes Gordan Huxley, MD  metFORMIN  (GLUCOPHAGE ) 1000 MG tablet Take 1 tablet (1,000 mg total) by mouth 2 (two) times daily with a meal. 01/07/24  Yes Claudene Rover, MD  Multiple Vitamin (MULTIVITAMIN WITH MINERALS) TABS tablet Take 1 tablet by  mouth daily. 12/23/23  Yes Amin, Sumayya, MD  rosuvastatin (CRESTOR) 20 MG tablet Take 1 tablet (20 mg total) by mouth daily. 12/22/23 12/21/24 Yes Amin, Sumayya, MD  rosuvastatin (CRESTOR) 20 MG tablet Take 1 tablet (20 mg total) by mouth daily. 01/07/24 01/06/25 Yes Claudene Rover, MD  thiamine  (VITAMIN B-1) 100 MG tablet Take 1 tablet (100 mg total) by mouth daily. 12/23/23  Yes Amin, Sumayya, MD  clopidogrel (PLAVIX) 75 MG tablet Take 1 tablet (75 mg total) by mouth daily for 21 days. 12/23/23 01/07/24 Yes Caleen Qualia, MD  albuterol  (VENTOLIN  HFA) 108 (90 Base) MCG/ACT inhaler Inhale 2 puffs into the lungs every 6 (six) hours as needed for shortness of breath or wheezing. Patient not taking: Reported on 08/12/2023 12/23/22 12/23/23  [provider]  Blood Glucose Monitoring Suppl (BLOOD GLUCOSE MONITOR SYSTEM) w/Device KIT Use 3 (three) times daily to check blood sugar 06/12/23   Jens Durand, MD  Glucose Blood (BLOOD GLUCOSE TEST STRIPS) STRP Use 3 (three) times daily. Use as directed to check blood sugar. 06/12/23   Jens Durand, MD  Lancet Device MISC Use 3 (three) times daily to check blood sugar 06/12/23   Jens Durand, MD  Lancets MISC Use 3 (three) times daily. Use as directed to check blood sugar. 06/12/23   Jens Durand, MD  TRUEplus Lancets 33G MISC Use as directed to check blood sugar up 4 (four) times daily. 10/08/22  Iloabachie, Chioma E, NP    Stephene Alegria A Elick Aguilera ,PharmD Clinical Pharmacist  01/07/2024  4:27 PM

## 2024-01-07 NOTE — ED Notes (Signed)
 Spoke with Claudene, MD regarding patient. MD advised to call a code stroke.

## 2024-01-07 NOTE — ED Notes (Signed)
 Patient to ED room 1 from CT.
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
## 2024-01-12 ENCOUNTER — Telehealth: Payer: Self-pay

## 2024-01-12 DIAGNOSIS — E119 Type 2 diabetes mellitus without complications: Secondary | ICD-10-CM

## 2024-01-12 LAB — GLUCOSE, POCT (MANUAL RESULT ENTRY): POC Glucose: 225 mg/dL — AB (ref 70–99)

## 2024-01-12 NOTE — Telephone Encounter (Signed)
 Copied from CRM [id number]. Topic: Appointments - Scheduling Inquiry for Clinic >> Jan 12, 2024  1:31 PM Charlet HERO wrote: Reason for CRM: was not able to make appt for the patient got error message he was in the hospital and released the same day 01/07/2024 please help the patient with scheduling.

## 2024-01-12 NOTE — Congregational Nurse Program (Signed)
  Dept: [phone]   Congregational Nurse Program Note  Date of Encounter: 01/12/2024 Client to [facility], nurse led clinic, with request for blood glucose check. Postprandial 225. Client reports he is taking his oral antidiabetic medications. He also requested that RN make him an appointment with his PCP. RN contacted [facility], however an appointment was not able to be scheduled, RN was to receive a return call to schedule the appointment. MARLA Marina BSN, RN Past Medical History: Past Medical History:  Diagnosis Date   Alcohol abuse    Diabetes mellitus without complication [facility])     Encounter Details:  Community Questionnaire - 01/12/24 1200       Questionnaire   Ask client: Do you give verbal consent for me to treat you today? Yes    Student Assistance N/A    Location Patient Served  [facility]    Encounter Setting CN site    Population Status Unknown   curently staying with his son   Insurance Medicaid   BS/BC through the exchange   Insurance/Financial Assistance Referral N/A   RN to assist with Medicaid application.   Medication Have Medication Insecurities    Medical Provider [facility]   Screening Referrals Made N/A    Medical Referrals Made [facility]   [facility] call [facility] for [facility]   Medical Appointment Completed N/A    CNP Interventions Educate;Advocate/Support;Navigate Healthcare System    Screenings CN Performed Blood Glucose    ED Visit Averted N/A    Life-Saving Intervention Made N/A

## 2024-01-13 DIAGNOSIS — E119 Type 2 diabetes mellitus without complications: Secondary | ICD-10-CM

## 2024-01-13 LAB — GLUCOSE, POCT (MANUAL RESULT ENTRY): POC Glucose: 182 mg/dL — AB (ref 70–99)

## 2024-01-13 NOTE — Congregational Nurse Program (Signed)
  Dept: [phone]   Congregational Nurse Program Note  Date of Encounter: 01/13/2024 Client to [facility], nurse led clinic, with request for blood sugar check and to follow up on PCP appointment RN tried to make yesterday. Post prandial blood sugar 182. Client continues to report he is taking his Metformin  and glipizide  regularly and watching my sweets. RN has attempted at other visits and will continue to provide diabetes education. RN contacted [facility] and was able to make an appointment for client with Dr. Bernardo for 12/1 at 10:00 am. Medicaid Transportation arranged and will pick client up here at the center at 9:00 am, return trip also arranged. Transportation will be with [employer], client is aware. No other needs at this time. MARLA Marina BSN, RN Past Medical History: Past Medical History:  Diagnosis Date   Alcohol abuse    Diabetes mellitus without complication [facility])     Encounter Details:  Community Questionnaire - 01/13/24 1045       Questionnaire   Ask client: Do you give verbal consent for me to treat you today? Yes    Student Assistance N/A    Location Patient Served  [facility]    Encounter Setting CN site    Population Status Unknown   curently staying with his son   Insurance Medicaid   BS/BC through the exchange   Insurance/Financial Assistance Referral N/A   RN to assist with Medicaid application.   Medication Have Medication Insecurities    [facility] Provider [facility]   Screening Referrals Made N/A    Medical Referrals Made [facility]   [facility] call [facility] for [facility]   Medical Appointment Completed N/A    CNP Interventions Educate;Advocate/Support;Navigate Healthcare System    Screenings CN Performed Blood Glucose    ED Visit Averted N/A    Life-Saving Intervention Made N/A

## 2024-01-13 NOTE — Telephone Encounter (Signed)
 Tried calling pt and call could not be delivered

## 2024-01-18 ENCOUNTER — Encounter: Payer: Self-pay | Admitting: Internal Medicine

## 2024-01-18 ENCOUNTER — Ambulatory Visit: Payer: MEDICAID | Admitting: Internal Medicine

## 2024-01-18 ENCOUNTER — Other Ambulatory Visit: Payer: Self-pay

## 2024-01-18 VITALS — BP 134/82 | HR 101 | Temp 98.1°F | Resp 16 | Ht 69.0 in | Wt 137.1 lb

## 2024-01-18 DIAGNOSIS — D649 Anemia, unspecified: Secondary | ICD-10-CM

## 2024-01-18 DIAGNOSIS — Z8673 Personal history of transient ischemic attack (TIA), and cerebral infarction without residual deficits: Secondary | ICD-10-CM

## 2024-01-18 DIAGNOSIS — F1020 Alcohol dependence, uncomplicated: Secondary | ICD-10-CM

## 2024-01-18 DIAGNOSIS — E782 Mixed hyperlipidemia: Secondary | ICD-10-CM

## 2024-01-18 DIAGNOSIS — R053 Chronic cough: Secondary | ICD-10-CM

## 2024-01-18 DIAGNOSIS — E1165 Type 2 diabetes mellitus with hyperglycemia: Secondary | ICD-10-CM

## 2024-01-18 DIAGNOSIS — E875 Hyperkalemia: Secondary | ICD-10-CM

## 2024-01-18 DIAGNOSIS — I10 Essential (primary) hypertension: Secondary | ICD-10-CM

## 2024-01-18 DIAGNOSIS — J432 Centrilobular emphysema: Secondary | ICD-10-CM

## 2024-01-18 DIAGNOSIS — K119 Disease of salivary gland, unspecified: Secondary | ICD-10-CM

## 2024-01-18 MED ORDER — METFORMIN HCL ER 500 MG PO TB24
1000.0000 mg | ORAL_TABLET | Freq: Two times a day (BID) | ORAL | 1 refills | Status: AC
  Filled 2024-01-18: qty 180, 45d supply, fill #0

## 2024-01-18 MED ORDER — VITAMIN B-1 100 MG PO TABS
100.0000 mg | ORAL_TABLET | Freq: Every day | ORAL | 1 refills | Status: AC
  Filled 2024-01-18: qty 90, 90d supply, fill #0

## 2024-01-18 MED ORDER — NALTREXONE HCL 50 MG PO TABS
ORAL_TABLET | ORAL | 1 refills | Status: AC
  Filled 2024-01-18: qty 30, 30d supply, fill #0

## 2024-01-18 MED ORDER — LOSARTAN POTASSIUM 25 MG PO TABS
25.0000 mg | ORAL_TABLET | Freq: Every day | ORAL | 1 refills | Status: AC
  Filled 2024-01-18: qty 90, 90d supply, fill #0

## 2024-01-18 MED ORDER — GLIPIZIDE ER 10 MG PO TB24
10.0000 mg | ORAL_TABLET | Freq: Every day | ORAL | 1 refills | Status: AC
  Filled 2024-01-18 – 2024-03-23 (×2): qty 90, 90d supply, fill #0

## 2024-01-18 MED ORDER — FOLIC ACID 1 MG PO TABS
1.0000 mg | ORAL_TABLET | Freq: Every day | ORAL | 1 refills | Status: AC
  Filled 2024-01-18: qty 90, 90d supply, fill #0

## 2024-01-18 MED ORDER — ROSUVASTATIN CALCIUM 20 MG PO TABS
20.0000 mg | ORAL_TABLET | Freq: Every day | ORAL | 1 refills | Status: AC
  Filled 2024-01-18: qty 90, 90d supply, fill #0

## 2024-01-18 MED ORDER — ALBUTEROL SULFATE HFA 108 (90 BASE) MCG/ACT IN AERS
2.0000 | INHALATION_SPRAY | Freq: Four times a day (QID) | RESPIRATORY_TRACT | 2 refills | Status: AC | PRN
  Filled 2024-01-18: qty 6.7, 30d supply, fill #0

## 2024-01-18 NOTE — Progress Notes (Signed)
 Established Patient Office Visit  Subjective   Patient ID: Richard Estes, male    DOB: [birth year]  Age: 63 y.o. MRN: [id number]  Chief Complaint  Patient presents with   Hospitalization Follow-up    HPI  Patient is here for ER follow up. This is my first time meeting him.  Discussed the use of AI scribe software for clinical note transcription with the patient, who gave verbal consent to proceed.  History of Present Illness Richard Estes is a 63 year old male with a history of stroke who presents with concerns about recent strokes and medication management.  He had a right thalamic stroke in early November confirmed by MRI, followed by a mini stroke at the end of November, and now has left-sided weakness. He takes aspirin  and Plavix  for stroke prevention but has run out of Plavix .  He has diabetes treated with metformin  1000 mg twice daily and glipizide  10 mg daily. He has diarrhea with metformin . His most recent A1c was 8.0.  He has an 8 mm right parotid gland lesion under evaluation for possible malignancy and has had several recent CT scans.  He has high cholesterol and was on Crestor  but has run out of it.  He drinks alcohol daily, has tried to quit including rehab, and is interested in medication to reduce cravings.  He has high blood pressure previously managed with losartan  25 mg, which he has run out of. His blood pressure was high during a recent hospitalization.  He reports a persistent productive cough with mucus that he spits out. He smokes cigarettes.  Discharge Date: 01/07/24 Diagnosis: Polysubstance abuse, TIA Procedures/tests: CT Head without acute intracranial abnormalities, with remote lacunar infarct in the right basal ganglia.  CTA of the head showing no large vessel occlusion with unchanged moderate to moderate bilateral vertebral artery origin stenosis, mild atherosclerosis and mild central lobar emphysema.  MRI of the brain with no acute intercranial  abnormality, mild to moderate chronic small vessel ischemic disease and moderately advanced cerebral atrophy. New medications: None none Discontinued medications: None Discharge instructions: Follow-up with PCP Status: fluctuating   Patient Active Problem List   Diagnosis Date Noted   Type 2 diabetes mellitus without complications (HCC) 12/22/2023   Alcoholic intoxication 12/22/2023   Polysubstance abuse (HCC) 12/22/2023   Paresthesia 12/22/2023   Lesion of parotid gland 12/22/2023   Cerebrovascular accident (CVA) (HCC) 12/22/2023   Left arm weakness 12/22/2023   TIA (transient ischemic attack) 12/21/2023   Hepatitis C virus infection without hepatic coma 08/18/2023   Smoking greater than 20 pack years 08/13/2023   Elevated LFTs 08/13/2023   Acute GI bleeding 06/19/2023   Anal ulcer 06/19/2023   Lower GI bleeding 06/19/2023   Rectal bleeding 06/12/2023   Uncontrolled type 2 diabetes mellitus with hyperglycemia, without long-term current use of insulin  (HCC) 06/11/2023   Unhoused person 06/11/2023   Hemorrhoids, internal, with bleeding 06/11/2023   ABLA (acute blood loss anemia) 06/11/2023   Hypokalemia 06/11/2023   Aneurysm of common iliac artery 06/11/2023   Encounter to establish care 04/03/2022   Current smoker 04/03/2022   Alcohol abuse 04/03/2022   Past Medical History:  Diagnosis Date   Alcohol abuse    Diabetes mellitus without complication (HCC)    Past Surgical History:  Procedure Laterality Date   RECTAL EXAM UNDER ANESTHESIA N/A 06/11/2023   Procedure: EXAM UNDER ANESTHESIA, RECTUM;  Surgeon: Rodolph Romano, MD;  Location: [facility] ORS;  Service: General;  Laterality: N/A;  RECTAL EXAM UNDER ANESTHESIA N/A 06/19/2023   Procedure: EXAM UNDER ANESTHESIA, RECTUM;  Surgeon: Desiderio Schanz, MD;  Location: [facility] ORS;  Service: General;  Laterality: N/A;   Social History   Tobacco Use   Smoking status: Every Day    Current packs/day: 1.00    Average packs/day: 1  pack/day for 40.4 years (40.4 ttl pk-yrs)    Types: Cigarettes    Start date: 08/12/1983   Smokeless tobacco: Never   Tobacco comments:    Sometimes list  Vaping Use   Vaping status: Never Used  Substance Use Topics   Alcohol use: Yes    Comment: all i can get every day-i am an alcoholic   Drug use: No    Comment: last used marijuana 08/09/16   Social History   Socioeconomic History   Marital status: Legally Separated    Spouse name: Not on file   Number of children: Not on file   Years of education: Not on file   Highest education level: Not on file  Occupational History   Not on file  Tobacco Use   Smoking status: Every Day    Current packs/day: 1.00    Average packs/day: 1 pack/day for 40.4 years (40.4 ttl pk-yrs)    Types: Cigarettes    Start date: 08/12/1983   Smokeless tobacco: Never   Tobacco comments:    Sometimes list  Vaping Use   Vaping status: Never Used  Substance and Sexual Activity   Alcohol use: Yes    Comment: all i can get every day-i am an alcoholic   Drug use: No    Comment: last used marijuana 08/09/16   Sexual activity: Not Currently  Other Topics Concern   Not on file  Social History Narrative   Not on file   Social Drivers of Health   Financial Resource Strain: Medium Risk (08/12/2023)   Overall Financial Resource Strain (CARDIA)    Difficulty of Paying Living Expenses: Somewhat hard  Food Insecurity: Food Insecurity Present (12/22/2023)   Hunger Vital Sign    Worried About Running Out of Food in the Last Year: Sometimes true    Ran Out of Food in the Last Year: Sometimes true  Transportation Needs: No Transportation Needs (12/22/2023)   PRAPARE - Administrator, Civil Service (Medical): No    Lack of Transportation (Non-Medical): No  Physical Activity: Inactive (08/12/2023)   Exercise Vital Sign    Days of Exercise per Week: 0 days    Minutes of Exercise per Session: 0 min  Stress: No Stress Concern Present (08/12/2023)    [employer] of Occupational Health - Occupational Stress Questionnaire    Feeling of Stress: Only a little  Social Connections: Moderately Integrated (06/19/2023)   Social Connection and Isolation Panel    Frequency of Communication with Friends and Family: More than three times a week    Frequency of Social Gatherings with Friends and Family: More than three times a week    Attends Religious Services: More than 4 times per year    Active Member of [employer] or Organizations: Yes    Attends Banker Meetings: More than 4 times per year    Marital Status: Divorced  Intimate Partner Violence: Not At Risk (12/22/2023)   Humiliation, Afraid, Rape, and Kick questionnaire    Fear of Current or Ex-Partner: No    Emotionally Abused: No    Physically Abused: No    Sexually Abused: No   Family Status  Relation Name Status   Mother  Alive   Father  Deceased  No partnership data on file   Family History  Problem Relation Age of Onset   Arthritis Mother    Allergies  Allergen Reactions   Other Anaphylaxis   Bee Venom       Review of Systems  Respiratory:  Positive for cough, sputum production and shortness of breath.   Neurological: Negative.       Objective:     BP 134/82 (Cuff Size: Large)   Pulse (!) 101   Temp 98.1 F (36.7 C) (Oral)   Resp 16   Ht 5' 9 (1.753 m)   Wt 137 lb 1.6 oz (62.2 kg)   SpO2 98%   BMI 20.25 kg/m  BP Readings from Last 3 Encounters:  01/18/24 134/82  01/07/24 (!) 170/106  01/07/24 (!) 138/92   Wt Readings from Last 3 Encounters:  01/18/24 137 lb 1.6 oz (62.2 kg)  01/07/24 135 lb (61.2 kg)  12/22/23 134 lb 4.2 oz (60.9 kg)      Physical Exam Constitutional:      Appearance: Normal appearance.  HENT:     Head: Normocephalic and atraumatic.  Eyes:     Conjunctiva/sclera: Conjunctivae normal.  Neck:     Comments: No thyromegaly, right mass palpated on the parotid gland, mobile and non-tender Cardiovascular:     Rate and  Rhythm: Normal rate and regular rhythm.  Pulmonary:     Effort: Pulmonary effort is normal.     Breath sounds: Wheezing present.  Musculoskeletal:     Cervical back: No tenderness.  Lymphadenopathy:     Cervical: No cervical adenopathy.  Skin:    General: Skin is warm and dry.  Neurological:     General: No focal deficit present.     Mental Status: He is alert. Mental status is at baseline.  Psychiatric:        Mood and Affect: Mood normal.        Behavior: Behavior normal.      No results found for any visits on 01/18/24.  Last CBC Lab Results  Component Value Date   WBC 4.7 01/07/2024   HGB 11.7 (L) 01/07/2024   HCT 35.4 (L) 01/07/2024   MCV 88.3 01/07/2024   MCH 29.2 01/07/2024   RDW 12.8 01/07/2024   PLT 213 01/07/2024   Last metabolic panel Lab Results  Component Value Date   GLUCOSE 219 (H) 01/07/2024   NA 137 01/07/2024   K 3.4 (L) 01/07/2024   CL 98 01/07/2024   CO2 28 01/07/2024   BUN 6 (L) 01/07/2024   CREATININE 0.77 01/07/2024   GFRNONAA >60 01/07/2024   CALCIUM  9.1 01/07/2024   PHOS 4.2 06/19/2023   PROT 7.8 01/07/2024   ALBUMIN 4.0 01/07/2024   LABGLOB 2.7 12/25/2016   AGRATIO 1.7 12/25/2016   BILITOT 0.6 01/07/2024   ALKPHOS 86 01/07/2024   AST 172 (H) 01/07/2024   ALT 107 (H) 01/07/2024   ANIONGAP 12 01/07/2024   Last lipids Lab Results  Component Value Date   CHOL 97 12/22/2023   HDL 28 (L) 12/22/2023   LDLCALC 55 12/22/2023   TRIG 72 12/22/2023   CHOLHDL 3.5 12/22/2023   Last hemoglobin A1c Lab Results  Component Value Date   HGBA1C 8.0 (H) 12/21/2023   Last thyroid functions No results found for: TSH, T3TOTAL, T4TOTAL, FREET4, THYROIDAB Last vitamin D No results found for: 25OHVITD2, 25OHVITD3, VD25OH Last vitamin B12 and Folate Lab Results  Component Value Date   VITAMINB12 447 06/11/2023   FOLATE 7.4 06/11/2023      The ASCVD Risk score (Arnett DK, et al., 2019) failed to calculate for the following  reasons:   Risk score cannot be calculated because patient has a medical history suggesting prior/existing ASCVD    Assessment & Plan:   Assessment & Plan Hx of Cerebral infarction with vertebral artery stenosis, recent TIA Recent ischemic stroke with small acute infarct in the right thalamus and mini-stroke. Moderate to severe bilateral vertebral artery origin stenosis. Risk of recurrent strokes due to inadequate blood flow and cholesterol deposition. - Continue aspirin  for antiplatelet therapy. - Restarted losartan  25 mg for blood pressure control. - Refilled Crestor  for cholesterol management. - Scheduled follow-up in one month to review medications and assess stroke risk.  Type 2 diabetes mellitus Blood sugar levels better controlled with A1c of 8.0. Experiencing diarrhea likely due to metformin . - Switched metformin  to extended-release formulation to reduce gastrointestinal side effects. - Continue glipizide  10 mg once daily. - Scheduled follow-up in one month to reassess diabetes management.  Hypertension Blood pressure currently 134/82 mmHg. Previously on losartan , which was discontinued. - Restarted losartan  25 mg for blood pressure control.  Hyperlipidemia Contributing to vertebral artery stenosis. Previously on Crestor , which was discontinued. - Refilled Crestor  for cholesterol management.  Alcohol dependence Chronic alcohol use with previous attempts at cessation and relapse. Discussed potential benefits of naltrexone. Liver function slightly elevated, but naltrexone deemed safe to initiate. - Started naltrexone 25 mg daily for one week, then increase to 50 mg daily. - Ordered repeat labs to monitor liver function and potassium levels.  Right parotid gland lesion, under evaluation 8 mm lesion in the right parotid gland identified. Further evaluation needed to determine if it is cancerous. - Referred to ENT specialist for further evaluation of parotid gland  lesion.  COPD Persistent cough with sputum production. No history of tobacco use other than cigarette smoking. - Prescribed inhaler for use if experiencing shortness of breath or wheezing. - Recommended over-the-counter Mucinex for mucus management.  - rosuvastatin  (CRESTOR ) 20 MG tablet; Take 1 tablet (20 mg total) by mouth daily.  Dispense: 90 tablet; Refill: 1 - metFORMIN  (GLUCOPHAGE -XR) 500 MG 24 hr tablet; Take 2 tablets (1,000 mg total) by mouth 2 (two) times daily with a meal.  Dispense: 180 tablet; Refill: 1 - glipiZIDE  (GLUCOTROL  XL) 10 MG 24 hr tablet; Take 1 tablet (10 mg total) by mouth daily with breakfast.  Dispense: 90 tablet; Refill: 1 - Urine Microalbumin w/creat. ratio - losartan  (COZAAR ) 25 MG tablet; Take 1 tablet (25 mg total) by mouth daily.  Dispense: 90 tablet; Refill: 1 - thiamine  (VITAMIN B-1) 100 MG tablet; Take 1 tablet (100 mg total) by mouth daily.  Dispense: 90 tablet; Refill: 1 - folic acid  (FOLVITE ) 1 MG tablet; Take 1 tablet (1 mg total) by mouth daily.  Dispense: 90 tablet; Refill: 1 - naltrexone (DEPADE) 50 MG tablet; Start 25 mg once daily for 7 days, then increase to 50 mg daily.  Dispense: 30 tablet; Refill: 1 - Ambulatory referral to ENT - albuterol  (VENTOLIN  HFA) 108 (90 Base) MCG/ACT inhaler; Inhale 2 puffs into the lungs every 6 (six) hours as needed for wheezing or shortness of breath.  Dispense: 6.7 g; Refill: 2 - Comprehensive Metabolic Panel (CMET) - CBC w/Diff/Platelet  Return in about 4 weeks (around 02/15/2024).    Sharyle Fischer, DO

## 2024-01-18 NOTE — Patient Instructions (Addendum)
 It was great seeing you today!  Plan discussed at today's visit: -Blood work ordered today, results will be uploaded to MyChart.  -Urine test to look at kidneys today -Restart blood pressure medication and cholesterol medication as well as vitamins -Referral to ENT placed -Recommend Mucinex in the  morning to help cough up mucus, can use inhaler every 4-6 hours as needed for shortness of breath or wheezing.   Follow up in: 1 month  Take care and let us  know if you have any questions or concerns prior to your next visit.  Dr. Bernardo

## 2024-01-19 ENCOUNTER — Ambulatory Visit: Payer: Self-pay | Admitting: Internal Medicine

## 2024-01-19 LAB — CBC WITH DIFFERENTIAL/PLATELET
Absolute Lymphocytes: 2011 {cells}/uL (ref 850–3900)
Absolute Monocytes: 546 {cells}/uL (ref 200–950)
Basophils Absolute: 40 {cells}/uL (ref 0–200)
Basophils Relative: 0.9 %
Eosinophils Absolute: 62 {cells}/uL (ref 15–500)
Eosinophils Relative: 1.4 %
HCT: 38.4 % — ABNORMAL LOW (ref 39.4–51.1)
Hemoglobin: 12.8 g/dL — ABNORMAL LOW (ref 13.2–17.1)
MCH: 29.2 pg (ref 27.0–33.0)
MCHC: 33.3 g/dL (ref 31.6–35.4)
MCV: 87.5 fL (ref 81.4–101.7)
MPV: 9.6 fL (ref 7.5–12.5)
Monocytes Relative: 12.4 %
Neutro Abs: 1742 {cells}/uL (ref 1500–7800)
Neutrophils Relative %: 39.6 %
Platelets: 158 Thousand/uL (ref 140–400)
RBC: 4.39 Million/uL (ref 4.20–5.80)
RDW: 11.2 % (ref 11.0–15.0)
Total Lymphocyte: 45.7 %
WBC: 4.4 Thousand/uL (ref 3.8–10.8)

## 2024-01-19 LAB — COMPREHENSIVE METABOLIC PANEL WITH GFR
AG Ratio: 1 (calc) (ref 1.0–2.5)
ALT: 90 U/L — ABNORMAL HIGH (ref 9–46)
AST: 141 U/L — ABNORMAL HIGH (ref 10–35)
Albumin: 3.9 g/dL (ref 3.6–5.1)
Alkaline phosphatase (APISO): 80 U/L (ref 35–144)
BUN: 11 mg/dL (ref 7–25)
CO2: 27 mmol/L (ref 20–32)
Calcium: 8.6 mg/dL (ref 8.6–10.3)
Chloride: 96 mmol/L — ABNORMAL LOW (ref 98–110)
Creat: 0.83 mg/dL (ref 0.70–1.35)
Globulin: 4 g/dL — ABNORMAL HIGH (ref 1.9–3.7)
Glucose, Bld: 246 mg/dL — ABNORMAL HIGH (ref 65–99)
Potassium: 4.2 mmol/L (ref 3.5–5.3)
Sodium: 133 mmol/L — ABNORMAL LOW (ref 135–146)
Total Bilirubin: 0.8 mg/dL (ref 0.2–1.2)
Total Protein: 7.9 g/dL (ref 6.1–8.1)
eGFR: 98 mL/min/1.73m2 (ref >=60)

## 2024-02-03 DIAGNOSIS — E119 Type 2 diabetes mellitus without complications: Secondary | ICD-10-CM

## 2024-02-03 LAB — GLUCOSE, POCT (MANUAL RESULT ENTRY): POC Glucose: 69 mg/dL — AB (ref 70–99)

## 2024-02-03 NOTE — Congregational Nurse Program (Signed)
°  Dept: [phone]   Congregational Nurse Program Note  Date of Encounter: 02/03/2024 Client to [facility], nurse led clinic, with request for blood sugar check. Client reports he had taken his glipizide  and metformin , but had not eaten. Blood sugar 69. Client reports he has been taking his medications correctly and avoiding sweets, but still drinking. Client stated he would like to have a follow up appointment with his PCP, however he had to leave the center prior to Ren assisting with that. Rn to assist at his next visit to the center. MARLA Casimir Burkitt, Rn Past Medical History: Past Medical History:  Diagnosis Date   Alcohol abuse    Diabetes mellitus without complication [facility])     Encounter Details:  Community Questionnaire - 02/03/24 1000       Questionnaire   Ask client: Do you give verbal consent for me to treat you today? Yes    Student Assistance N/A    Location Patient Served  [facility]    Encounter Setting CN site    Population Status Unknown   curently staying with his son   Insurance Medicaid   BS/BC through the exchange   Insurance/Financial Assistance Referral N/A   RN to assist with Medicaid application.   Medication Have Medication Insecurities    Medical Provider [facility]   Screening Referrals Made N/A    Medical Referrals Made N/A   [facility] around [facility]18[facility] call placed for apt   Medical Appointment Completed [facility]    CNP Interventions Educate;Advocate/Support    Screenings CN Performed Blood Glucose    ED Visit Averted N/A    Life-Saving Intervention Made N/A

## 2024-03-10 NOTE — Congregational Nurse Program (Signed)
" °  Dept: [phone]   Congregational Nurse Program Note  Date of Encounter: 03/10/2024 Client to [facility], nurse led clinic, for medication review. Client was in need of a refill on his glipizide , but declined RN calling it in to the [facility] pharmacy. He stated he was leaving to go back to [facility] today and he would get it filled there. MARLA Marina BSN, RN Past Medical History: Past Medical History:  Diagnosis Date   Alcohol abuse    Diabetes mellitus without complication [facility])     Encounter Details:  Community Questionnaire - 03/10/24 1326       Questionnaire   Ask client: Do you give verbal consent for me to treat you today? Yes    Student Assistance N/A    Location Patient Served  [facility]    Encounter Setting CN site    Population Status Unknown   curently staying with his son   Insurance Medicaid   BS/BC through the exchange   Insurance/Financial Assistance Referral N/A    Medication Have Medication Insecurities;Patient Medications Reviewed    Medical Provider [facility]   [facility]   Medical Referrals Made NA    Medical Appointment Completed N/A    Screenings CN Performed (remember to also record results) NA    ED Visit Averted N/A            "

## 2024-03-23 ENCOUNTER — Other Ambulatory Visit: Payer: Self-pay

## 2024-03-24 ENCOUNTER — Other Ambulatory Visit: Payer: Self-pay
